# Patient Record
Sex: Female | Born: 1950 | Race: White | Hispanic: No | Marital: Married
Health system: Southern US, Community
[De-identification: ages and names within clinical notes are randomized; demographics above are authoritative.]

---

## 2000-08-13 ENCOUNTER — Encounter: Payer: Self-pay | Admitting: Internal Medicine

## 2000-08-13 ENCOUNTER — Emergency Department (HOSPITAL_COMMUNITY): Admission: EM | Admit: 2000-08-13 | Discharge: 2000-08-13 | Payer: Self-pay | Admitting: Emergency Medicine

## 2001-03-18 ENCOUNTER — Encounter: Admission: RE | Admit: 2001-03-18 | Discharge: 2001-03-18 | Payer: Self-pay

## 2018-10-02 ENCOUNTER — Other Ambulatory Visit: Payer: Self-pay

## 2018-10-02 ENCOUNTER — Encounter (HOSPITAL_COMMUNITY): Payer: Self-pay | Admitting: *Deleted

## 2018-10-02 ENCOUNTER — Emergency Department (HOSPITAL_COMMUNITY): Payer: Medicare HMO

## 2018-10-02 ENCOUNTER — Inpatient Hospital Stay (HOSPITAL_COMMUNITY)
Admission: EM | Admit: 2018-10-02 | Discharge: 2018-10-16 | DRG: 208 | Disposition: A | Discharge status: Hospice | Payer: Medicare HMO | Attending: Internal Medicine | Admitting: Internal Medicine

## 2018-10-02 DIAGNOSIS — Z66 Do not resuscitate: Secondary | ICD-10-CM | POA: Diagnosis present

## 2018-10-02 DIAGNOSIS — J9621 Acute and chronic respiratory failure with hypoxia: Secondary | ICD-10-CM

## 2018-10-02 DIAGNOSIS — M6282 Rhabdomyolysis: Secondary | ICD-10-CM | POA: Diagnosis present

## 2018-10-02 DIAGNOSIS — L270 Generalized skin eruption due to drugs and medicaments taken internally: Secondary | ICD-10-CM | POA: Diagnosis not present

## 2018-10-02 DIAGNOSIS — G9340 Encephalopathy, unspecified: Secondary | ICD-10-CM

## 2018-10-02 DIAGNOSIS — Z0189 Encounter for other specified special examinations: Secondary | ICD-10-CM

## 2018-10-02 DIAGNOSIS — F0280 Dementia in other diseases classified elsewhere without behavioral disturbance: Secondary | ICD-10-CM

## 2018-10-02 DIAGNOSIS — E1165 Type 2 diabetes mellitus with hyperglycemia: Secondary | ICD-10-CM | POA: Diagnosis present

## 2018-10-02 DIAGNOSIS — Z79890 Hormone replacement therapy (postmenopausal): Secondary | ICD-10-CM

## 2018-10-02 DIAGNOSIS — G9341 Metabolic encephalopathy: Secondary | ICD-10-CM | POA: Diagnosis present

## 2018-10-02 DIAGNOSIS — Z87891 Personal history of nicotine dependence: Secondary | ICD-10-CM | POA: Diagnosis not present

## 2018-10-02 DIAGNOSIS — I10 Essential (primary) hypertension: Secondary | ICD-10-CM | POA: Diagnosis present

## 2018-10-02 DIAGNOSIS — R4182 Altered mental status, unspecified: Secondary | ICD-10-CM | POA: Diagnosis not present

## 2018-10-02 DIAGNOSIS — R509 Fever, unspecified: Secondary | ICD-10-CM | POA: Diagnosis present

## 2018-10-02 DIAGNOSIS — E860 Dehydration: Secondary | ICD-10-CM

## 2018-10-02 DIAGNOSIS — Z79899 Other long term (current) drug therapy: Secondary | ICD-10-CM

## 2018-10-02 DIAGNOSIS — E861 Hypovolemia: Secondary | ICD-10-CM | POA: Diagnosis present

## 2018-10-02 DIAGNOSIS — R29810 Facial weakness: Secondary | ICD-10-CM | POA: Diagnosis present

## 2018-10-02 DIAGNOSIS — I480 Paroxysmal atrial fibrillation: Secondary | ICD-10-CM | POA: Diagnosis present

## 2018-10-02 DIAGNOSIS — E872 Acidosis: Secondary | ICD-10-CM | POA: Diagnosis present

## 2018-10-02 DIAGNOSIS — G300 Alzheimer's disease with early onset: Secondary | ICD-10-CM | POA: Diagnosis present

## 2018-10-02 DIAGNOSIS — Z7189 Other specified counseling: Secondary | ICD-10-CM

## 2018-10-02 DIAGNOSIS — E876 Hypokalemia: Secondary | ICD-10-CM | POA: Diagnosis present

## 2018-10-02 DIAGNOSIS — E0590 Thyrotoxicosis, unspecified without thyrotoxic crisis or storm: Secondary | ICD-10-CM | POA: Diagnosis present

## 2018-10-02 DIAGNOSIS — I471 Supraventricular tachycardia: Secondary | ICD-10-CM | POA: Diagnosis present

## 2018-10-02 DIAGNOSIS — E039 Hypothyroidism, unspecified: Secondary | ICD-10-CM | POA: Diagnosis present

## 2018-10-02 DIAGNOSIS — T360X5A Adverse effect of penicillins, initial encounter: Secondary | ICD-10-CM | POA: Diagnosis not present

## 2018-10-02 DIAGNOSIS — J189 Pneumonia, unspecified organism: Secondary | ICD-10-CM

## 2018-10-02 DIAGNOSIS — J9600 Acute respiratory failure, unspecified whether with hypoxia or hypercapnia: Secondary | ICD-10-CM | POA: Diagnosis not present

## 2018-10-02 DIAGNOSIS — R339 Retention of urine, unspecified: Secondary | ICD-10-CM | POA: Diagnosis not present

## 2018-10-02 DIAGNOSIS — J690 Pneumonitis due to inhalation of food and vomit: Principal | ICD-10-CM | POA: Diagnosis present

## 2018-10-02 DIAGNOSIS — L8990 Pressure ulcer of unspecified site, unspecified stage: Secondary | ICD-10-CM

## 2018-10-02 DIAGNOSIS — N179 Acute kidney failure, unspecified: Secondary | ICD-10-CM | POA: Diagnosis present

## 2018-10-02 DIAGNOSIS — I4891 Unspecified atrial fibrillation: Secondary | ICD-10-CM

## 2018-10-02 DIAGNOSIS — E870 Hyperosmolality and hypernatremia: Secondary | ICD-10-CM | POA: Diagnosis present

## 2018-10-02 DIAGNOSIS — T1490XA Injury, unspecified, initial encounter: Secondary | ICD-10-CM

## 2018-10-02 DIAGNOSIS — Z7901 Long term (current) use of anticoagulants: Secondary | ICD-10-CM

## 2018-10-02 DIAGNOSIS — J9601 Acute respiratory failure with hypoxia: Secondary | ICD-10-CM | POA: Diagnosis not present

## 2018-10-02 DIAGNOSIS — Z4659 Encounter for fitting and adjustment of other gastrointestinal appliance and device: Secondary | ICD-10-CM

## 2018-10-02 DIAGNOSIS — Z515 Encounter for palliative care: Secondary | ICD-10-CM | POA: Diagnosis not present

## 2018-10-02 LAB — BASIC METABOLIC PANEL
Anion gap: 14 (ref 5–15)
BUN: 70 mg/dL — ABNORMAL HIGH (ref 8–23)
CO2: 16 mmol/L — ABNORMAL LOW (ref 22–32)
Calcium: 8.6 mg/dL — ABNORMAL LOW (ref 8.9–10.3)
Chloride: 123 mmol/L — ABNORMAL HIGH (ref 98–111)
Creatinine, Ser: 2.65 mg/dL — ABNORMAL HIGH (ref 0.44–1.00)
GFR calc Af Amer: 21 mL/min — ABNORMAL LOW (ref >60)
GFR calc non Af Amer: 18 mL/min — ABNORMAL LOW (ref >60)
Glucose, Bld: 167 mg/dL — ABNORMAL HIGH (ref 70–99)
Potassium: 3.1 mmol/L — ABNORMAL LOW (ref 3.5–5.1)
Sodium: 153 mmol/L — ABNORMAL HIGH (ref 135–145)

## 2018-10-02 LAB — COMPREHENSIVE METABOLIC PANEL
ALT: 43 U/L (ref 0–44)
AST: 74 U/L — ABNORMAL HIGH (ref 15–41)
Albumin: 3.4 g/dL — ABNORMAL LOW (ref 3.5–5.0)
Alkaline Phosphatase: 95 U/L (ref 38–126)
Anion gap: 22 — ABNORMAL HIGH (ref 5–15)
BUN: 70 mg/dL — ABNORMAL HIGH (ref 8–23)
CO2: 13 mmol/L — ABNORMAL LOW (ref 22–32)
Calcium: 9.1 mg/dL (ref 8.9–10.3)
Chloride: 118 mmol/L — ABNORMAL HIGH (ref 98–111)
Creatinine, Ser: 3.43 mg/dL — ABNORMAL HIGH (ref 0.44–1.00)
GFR calc Af Amer: 15 mL/min — ABNORMAL LOW (ref >60)
GFR calc non Af Amer: 13 mL/min — ABNORMAL LOW (ref >60)
Glucose, Bld: 168 mg/dL — ABNORMAL HIGH (ref 70–99)
Potassium: 3.7 mmol/L (ref 3.5–5.1)
Sodium: 153 mmol/L — ABNORMAL HIGH (ref 135–145)
Total Bilirubin: 2.4 mg/dL — ABNORMAL HIGH (ref 0.3–1.2)
Total Protein: 6.4 g/dL — ABNORMAL LOW (ref 6.5–8.1)

## 2018-10-02 LAB — POCT I-STAT 7, (LYTES, BLD GAS, ICA,H+H)
Acid-base deficit: 11 mmol/L — ABNORMAL HIGH (ref 0.0–2.0)
Acid-base deficit: 10 mmol/L — ABNORMAL HIGH (ref 0.0–2.0)
Bicarbonate: 16.5 mmol/L — ABNORMAL LOW (ref 20.0–28.0)
Bicarbonate: 15.2 mmol/L — ABNORMAL LOW (ref 20.0–28.0)
Calcium, Ion: 1.22 mmol/L (ref 1.15–1.40)
Calcium, Ion: 1.29 mmol/L (ref 1.15–1.40)
HCT: 35 % — ABNORMAL LOW (ref 36.0–46.0)
HCT: 35 % — ABNORMAL LOW (ref 36.0–46.0)
Hemoglobin: 11.9 g/dL — ABNORMAL LOW (ref 12.0–15.0)
Hemoglobin: 11.9 g/dL — ABNORMAL LOW (ref 12.0–15.0)
O2 Saturation: 100 %
O2 Saturation: 98 %
Patient temperature: 34.8
Potassium: 2.8 mmol/L — ABNORMAL LOW (ref 3.5–5.1)
Potassium: 2.9 mmol/L — ABNORMAL LOW (ref 3.5–5.1)
Sodium: 153 mmol/L — ABNORMAL HIGH (ref 135–145)
Sodium: 153 mmol/L — ABNORMAL HIGH (ref 135–145)
TCO2: 18 mmol/L — ABNORMAL LOW (ref 22–32)
TCO2: 16 mmol/L — ABNORMAL LOW (ref 22–32)
pCO2 arterial: 40.1 mmHg (ref 32.0–48.0)
pCO2 arterial: 27.5 mmHg — ABNORMAL LOW (ref 32.0–48.0)
pH, Arterial: 7.223 — ABNORMAL LOW (ref 7.350–7.450)
pH, Arterial: 7.338 — ABNORMAL LOW (ref 7.350–7.450)
pO2, Arterial: 592 mmHg — ABNORMAL HIGH (ref 83.0–108.0)
pO2, Arterial: 104 mmHg (ref 83.0–108.0)

## 2018-10-02 LAB — URINALYSIS, ROUTINE W REFLEX MICROSCOPIC
Bilirubin Urine: NEGATIVE
Glucose, UA: NEGATIVE mg/dL
Ketones, ur: 20 mg/dL — AB
Leukocytes,Ua: NEGATIVE
Nitrite: NEGATIVE
Protein, ur: 100 mg/dL — AB
RBC / HPF: >50 RBC/hpf — ABNORMAL HIGH (ref 0–5)
Specific Gravity, Urine: 1.02 (ref 1.005–1.030)
pH: 6 (ref 5.0–8.0)

## 2018-10-02 LAB — POCT I-STAT EG7
Acid-base deficit: 14 mmol/L — ABNORMAL HIGH (ref 0.0–2.0)
Bicarbonate: 13.1 mmol/L — ABNORMAL LOW (ref 20.0–28.0)
Calcium, Ion: 1.13 mmol/L — ABNORMAL LOW (ref 1.15–1.40)
HCT: 40 % (ref 36.0–46.0)
Hemoglobin: 13.6 g/dL (ref 12.0–15.0)
O2 Saturation: 97 %
Potassium: 3.6 mmol/L (ref 3.5–5.1)
Sodium: 153 mmol/L — ABNORMAL HIGH (ref 135–145)
TCO2: 14 mmol/L — ABNORMAL LOW (ref 22–32)
pCO2, Ven: 33 mmHg — ABNORMAL LOW (ref 44.0–60.0)
pH, Ven: 7.207 — ABNORMAL LOW (ref 7.250–7.430)
pO2, Ven: 103 mmHg — ABNORMAL HIGH (ref 32.0–45.0)

## 2018-10-02 LAB — CBC WITH DIFFERENTIAL/PLATELET
Abs Immature Granulocytes: 0.07 10*3/uL (ref 0.00–0.07)
Basophils Absolute: 0 10*3/uL (ref 0.0–0.1)
Basophils Relative: 0 %
Eosinophils Absolute: 0 10*3/uL (ref 0.0–0.5)
Eosinophils Relative: 0 %
HCT: 47.5 % — ABNORMAL HIGH (ref 36.0–46.0)
Hemoglobin: 14.9 g/dL (ref 12.0–15.0)
Immature Granulocytes: 1 %
Lymphocytes Relative: 7 %
Lymphs Abs: 0.7 10*3/uL (ref 0.7–4.0)
MCH: 27.9 pg (ref 26.0–34.0)
MCHC: 31.4 g/dL (ref 30.0–36.0)
MCV: 89 fL (ref 80.0–100.0)
Monocytes Absolute: 0.8 10*3/uL (ref 0.1–1.0)
Monocytes Relative: 7 %
Neutro Abs: 9.7 10*3/uL — ABNORMAL HIGH (ref 1.7–7.7)
Neutrophils Relative %: 85 %
Platelets: 286 10*3/uL (ref 150–400)
RBC: 5.34 MIL/uL — ABNORMAL HIGH (ref 3.87–5.11)
RDW: 14.6 % (ref 11.5–15.5)
WBC: 11.3 10*3/uL — ABNORMAL HIGH (ref 4.0–10.5)
nRBC: 0 % (ref 0.0–0.2)

## 2018-10-02 LAB — GLUCOSE, CAPILLARY
Glucose-Capillary: 114 mg/dL — ABNORMAL HIGH (ref 70–99)
Glucose-Capillary: 120 mg/dL — ABNORMAL HIGH (ref 70–99)
Glucose-Capillary: 184 mg/dL — ABNORMAL HIGH (ref 70–99)

## 2018-10-02 LAB — TROPONIN I: Troponin I: 0.08 ng/mL (ref <0.03)

## 2018-10-02 LAB — CK TOTAL AND CKMB (NOT AT ARMC)
CK, MB: 42.8 ng/mL — ABNORMAL HIGH (ref 0.5–5.0)
Relative Index: 1.6 (ref 0.0–2.5)
Total CK: 2642 U/L — ABNORMAL HIGH (ref 38–234)

## 2018-10-02 LAB — PROTIME-INR
INR: 1.2 (ref 0.8–1.2)
Prothrombin Time: 15.4 seconds — ABNORMAL HIGH (ref 11.4–15.2)

## 2018-10-02 LAB — LACTIC ACID, PLASMA: Lactic Acid, Venous: 4.5 mmol/L (ref 0.5–1.9)

## 2018-10-02 MED ORDER — POTASSIUM CHLORIDE 10 MEQ/100ML IV SOLN
10.0000 meq | INTRAVENOUS | Status: AC
  Administered 2018-10-02 (×2): 10 meq via INTRAVENOUS
  Filled 2018-10-02 (×2): qty 100

## 2018-10-02 MED ORDER — SODIUM CHLORIDE 0.9 % IV SOLN
2.0000 g | Freq: Once | INTRAVENOUS | Status: AC
  Administered 2018-10-02: 2 g via INTRAVENOUS
  Filled 2018-10-02: qty 20

## 2018-10-02 MED ORDER — SODIUM CHLORIDE 0.9 % IV SOLN
INTRAVENOUS | Status: DC
  Administered 2018-10-02: 100 mL/h via INTRAVENOUS
  Administered 2018-10-03: 02:00:00 via INTRAVENOUS

## 2018-10-02 MED ORDER — AMIODARONE LOAD VIA INFUSION
150.0000 mg | Freq: Once | INTRAVENOUS | Status: AC
  Administered 2018-10-02: 150 mg via INTRAVENOUS
  Filled 2018-10-02: qty 83.34

## 2018-10-02 MED ORDER — SODIUM CHLORIDE 0.9 % IV BOLUS
1000.0000 mL | Freq: Once | INTRAVENOUS | Status: AC
  Administered 2018-10-02: 1000 mL via INTRAVENOUS

## 2018-10-02 MED ORDER — CHLORHEXIDINE GLUCONATE CLOTH 2 % EX PADS
6.0000 | MEDICATED_PAD | Freq: Every day | CUTANEOUS | Status: DC
  Administered 2018-10-03 – 2018-10-08 (×6): 6 via TOPICAL

## 2018-10-02 MED ORDER — SODIUM CHLORIDE 0.9 % IV BOLUS
500.0000 mL | Freq: Once | INTRAVENOUS | Status: AC
  Administered 2018-10-02: 18:00:00 500 mL via INTRAVENOUS

## 2018-10-02 MED ORDER — SODIUM CHLORIDE 0.9 % IV SOLN
Freq: Once | INTRAVENOUS | Status: AC
  Administered 2018-10-02: 13:00:00 via INTRAVENOUS

## 2018-10-02 MED ORDER — HEPARIN SODIUM (PORCINE) 5000 UNIT/ML IJ SOLN: 5000.0000 [IU] | Freq: Three times a day (TID) | INTRAMUSCULAR | Status: DC

## 2018-10-02 MED ORDER — INSULIN ASPART 100 UNIT/ML ~~LOC~~ SOLN
0.0000 [IU] | SUBCUTANEOUS | Status: DC
  Administered 2018-10-02: 3 [IU] via SUBCUTANEOUS
  Administered 2018-10-03: 2 [IU] via SUBCUTANEOUS
  Administered 2018-10-03: 3 [IU] via SUBCUTANEOUS
  Administered 2018-10-03 (×2): 2 [IU] via SUBCUTANEOUS
  Administered 2018-10-03: 3 [IU] via SUBCUTANEOUS
  Administered 2018-10-04 – 2018-10-05 (×3): 2 [IU] via SUBCUTANEOUS
  Administered 2018-10-05 – 2018-10-07 (×6): 3 [IU] via SUBCUTANEOUS
  Administered 2018-10-07: 5 [IU] via SUBCUTANEOUS
  Administered 2018-10-07 (×2): 3 [IU] via SUBCUTANEOUS
  Administered 2018-10-07: 17:00:00 8 [IU] via SUBCUTANEOUS
  Administered 2018-10-07: 3 [IU] via SUBCUTANEOUS
  Administered 2018-10-08: 8 [IU] via SUBCUTANEOUS
  Administered 2018-10-08: 11 [IU] via SUBCUTANEOUS
  Administered 2018-10-08: 18:00:00 15 [IU] via SUBCUTANEOUS
  Administered 2018-10-08 (×2): 3 [IU] via SUBCUTANEOUS
  Administered 2018-10-08 – 2018-10-09 (×2): 5 [IU] via SUBCUTANEOUS
  Administered 2018-10-09: 04:00:00 3 [IU] via SUBCUTANEOUS
  Administered 2018-10-09 (×2): 8 [IU] via SUBCUTANEOUS
  Administered 2018-10-09: 11 [IU] via SUBCUTANEOUS
  Administered 2018-10-10: 3 [IU] via SUBCUTANEOUS
  Administered 2018-10-10: 12:00:00 5 [IU] via SUBCUTANEOUS
  Administered 2018-10-10: 2 [IU] via SUBCUTANEOUS
  Administered 2018-10-10: 3 [IU] via SUBCUTANEOUS

## 2018-10-02 MED ORDER — ROCURONIUM BROMIDE 50 MG/5ML IV SOLN
INTRAVENOUS | Status: AC | PRN
  Administered 2018-10-02: 80 mg via INTRAVENOUS

## 2018-10-02 MED ORDER — AMIODARONE HCL IN DEXTROSE 360-4.14 MG/200ML-% IV SOLN
60.0000 mg/h | INTRAVENOUS | Status: AC
  Administered 2018-10-02 – 2018-10-03 (×2): 60 mg/h via INTRAVENOUS
  Filled 2018-10-02 (×2): qty 200

## 2018-10-02 MED ORDER — METOPROLOL TARTRATE 5 MG/5ML IV SOLN
2.5000 mg | INTRAVENOUS | Status: DC | PRN
  Administered 2018-10-02: 16:00:00 5 mg via INTRAVENOUS
  Administered 2018-10-03 – 2018-10-04 (×2): 2.5 mg via INTRAVENOUS
  Administered 2018-10-04 – 2018-10-05 (×5): 5 mg via INTRAVENOUS
  Filled 2018-10-02 (×7): qty 5

## 2018-10-02 MED ORDER — CHLORHEXIDINE GLUCONATE 0.12% ORAL RINSE (MEDLINE KIT)
15.0000 mL | Freq: Two times a day (BID) | OROMUCOSAL | Status: DC
  Administered 2018-10-02 – 2018-10-15 (×24): 15 mL via OROMUCOSAL

## 2018-10-02 MED ORDER — PANTOPRAZOLE SODIUM 40 MG IV SOLR
40.0000 mg | Freq: Every day | INTRAVENOUS | Status: DC
  Administered 2018-10-02 – 2018-10-03 (×2): 40 mg via INTRAVENOUS
  Filled 2018-10-02 (×2): qty 40

## 2018-10-02 MED ORDER — ETOMIDATE 2 MG/ML IV SOLN
INTRAVENOUS | Status: AC | PRN
  Administered 2018-10-02: 20 mg via INTRAVENOUS

## 2018-10-02 MED ORDER — PROPOFOL 1000 MG/100ML IV EMUL
0.0000 ug/kg/min | INTRAVENOUS | Status: DC
  Administered 2018-10-02 – 2018-10-03 (×2): 15 ug/kg/min via INTRAVENOUS
  Administered 2018-10-04: 10 ug/kg/min via INTRAVENOUS
  Administered 2018-10-04: 5 ug/kg/min via INTRAVENOUS
  Filled 2018-10-02 (×3): qty 100

## 2018-10-02 MED ORDER — HEPARIN (PORCINE) 25000 UT/250ML-% IV SOLN
1250.0000 [IU]/h | INTRAVENOUS | Status: DC
  Administered 2018-10-02 – 2018-10-03 (×2): 1000 [IU]/h via INTRAVENOUS
  Administered 2018-10-04: 13:00:00 1200 [IU]/h via INTRAVENOUS
  Administered 2018-10-05: 1400 [IU]/h via INTRAVENOUS
  Administered 2018-10-06: 01:00:00 1250 [IU]/h via INTRAVENOUS
  Filled 2018-10-02 (×5): qty 250

## 2018-10-02 MED ORDER — FENTANYL CITRATE (PF) 100 MCG/2ML IJ SOLN: 25.0000 ug | INTRAMUSCULAR | Status: DC | PRN

## 2018-10-02 MED ORDER — PROPOFOL 1000 MG/100ML IV EMUL
5.0000 ug/kg/min | INTRAVENOUS | Status: DC
  Administered 2018-10-02: 10 ug/kg/min via INTRAVENOUS

## 2018-10-02 MED ORDER — FENTANYL CITRATE (PF) 100 MCG/2ML IJ SOLN
25.0000 ug | INTRAMUSCULAR | Status: DC | PRN
  Administered 2018-10-03: 25 ug via INTRAVENOUS
  Filled 2018-10-02: qty 2

## 2018-10-02 MED ORDER — AMIODARONE HCL IN DEXTROSE 360-4.14 MG/200ML-% IV SOLN
30.0000 mg/h | INTRAVENOUS | Status: DC
  Administered 2018-10-03: 10:00:00 30 mg/h via INTRAVENOUS
  Filled 2018-10-02: qty 200

## 2018-10-02 MED ORDER — HEPARIN BOLUS VIA INFUSION
3600.0000 [IU] | Freq: Once | INTRAVENOUS | Status: AC
  Administered 2018-10-02: 3600 [IU] via INTRAVENOUS
  Filled 2018-10-02: qty 3600

## 2018-10-02 MED ORDER — SODIUM CHLORIDE 0.9% FLUSH: 3.0000 mL | Freq: Once | INTRAVENOUS | Status: DC

## 2018-10-02 MED ORDER — PROPOFOL 1000 MG/100ML IV EMUL
INTRAVENOUS | Status: AC
  Filled 2018-10-02: qty 100

## 2018-10-02 MED ORDER — ORAL CARE MOUTH RINSE
15.0000 mL | OROMUCOSAL | Status: DC
  Administered 2018-10-02 – 2018-10-05 (×22): 15 mL via OROMUCOSAL

## 2018-10-02 NOTE — Progress Notes (Signed)
Pt continues to have elevated HR in the 140-160s, Afib RVR; pt has PRN lopressor ordered but unable to give as pt is hypotensive (systolic in the 80s). eLink notified. Awaiting new orders.  Herma Ard, RN

## 2018-10-02 NOTE — Progress Notes (Signed)
eLink Physician-Brief Progress Note Patient Name: Jordan Reed DOB: 24-Nov-1950 MRN: [id number]   Date of Service  10/02/2018  HPI/Events of Note  AFIB with RVR - Ventricular RAte = 1.7-150's. K+ = 2.9. K+ is being replaced. BP = 92/49.  eICU Interventions  Will order: 1. Amiodarone IV load and infusion.  2. Repeat BMP and Mg++ level at 10 PM.     Intervention Category Major Interventions: Arrhythmia - evaluation and management  Sommer,Steven Eugene 10/02/2018, 8:40 PM

## 2018-10-02 NOTE — ED Provider Notes (Signed)
[facility] Emergency Department Provider Note MRN:  [id number]  Arrival date & time: 10/02/18     Chief Complaint   Tachycardia   History of Present Illness   Jordan Reed is a 68 y.o. year-old female with unknown past medical history presenting to the ED with chief complaint of tachycardia.  Per report patient has a caregiver that was hospitalized 4 days ago and so the patient has been alone since that time.  Was found on the ground outside of her home by the mailman.  Altered, with bruising, combative.  Heart rate in the 190s with EMS, attempted cardioversion twice.  Review of Systems  A complete 10 system review of systems was obtained and all systems are negative except as noted in the HPI and PMH.   Patient's Health History   No past medical history on file.    No family history on file.  Social History   Socioeconomic History  . Marital status: Married    Spouse name: Not on file  . Number of children: Not on file  . Years of education: Not on file  . Highest education level: Not on file  Occupational History  . Not on file  Social Needs  . Financial resource strain: Not on file  . Food insecurity:    Worry: Not on file    Inability: Not on file  . Transportation needs:    Medical: Not on file    Non-medical: Not on file  Tobacco Use  . Smoking status: Not on file  Substance and Sexual Activity  . Alcohol use: Not on file  . Drug use: Not on file  . Sexual activity: Not on file  Lifestyle  . Physical activity:    Days per week: Not on file    Minutes per session: Not on file  . Stress: Not on file  Relationships  . Social connections:    Talks on phone: Not on file    Gets together: Not on file    Attends religious service: Not on file    Active member of club or organization: Not on file    Attends meetings of clubs or organizations: Not on file    Relationship status: Not on file  . Intimate partner violence:    Fear of current  or ex partner: Not on file    Emotionally abused: Not on file    Physically abused: Not on file    Forced sexual activity: Not on file  Other Topics Concern  . Not on file  Social History Narrative  . Not on file     Physical Exam  Vital Signs and Nursing Notes reviewed Vitals:   10/02/18 1111 10/02/18 1115  BP:  (!) 154/107  Pulse: (!) 153 80  Resp: 18 18  Temp:    SpO2: 100% 100%    CONSTITUTIONAL: Ill-appearing, combative NEURO: Awake, alert, not following commands, combative, moving all extremities EYES:  eyes equal and reactive ENT/NECK:  no LAD, no JVD CARDIO: Tachycardic rate, well-perfused, normal S1 and S2 PULM:  CTAB no wheezing or rhonchi GI/GU:  normal bowel sounds, non-distended, non-tender MSK/SPINE:  No gross deformities, pressure wounds to the right lateral thigh, bruising to the face and right flank SKIN:  no rash, atraumatic PSYCH: Unable to assess due to severity of condition  Diagnostic and Interventional Summary    EKG Interpretation  Date/Time:  Wednesday October 02 2018 10:41:35 EDT Ventricular Rate:  174 PR Interval:  QRS Duration: 70 QT Interval:  295 QTC Calculation: 502 R Axis:   20 Text Interpretation:  Atrial fibrillation with rapid V-rate Abnormal R-wave progression, early transition ST depression, probably rate related Baseline wander in lead(s) V1 Confirmed by Kennis CarinaBero, Marquavion Venhuizen [phone]([id number]) on 10/02/2018 11:47:40 AM      Labs Reviewed  COMPREHENSIVE METABOLIC PANEL - Abnormal; Notable for the following components:      Result Value   Sodium 153 (*)    Chloride 118 (*)    CO2 13 (*)    Glucose, Bld 168 (*)    BUN 70 (*)    Creatinine, Ser 3.43 (*)    Total Protein 6.4 (*)    Albumin 3.4 (*)    AST 74 (*)    Total Bilirubin 2.4 (*)    GFR calc non Af Amer 13 (*)    GFR calc Af Amer 15 (*)    Anion gap 22 (*)    All other components within normal limits  LACTIC ACID, PLASMA - Abnormal; Notable for the following components:   Lactic  Acid, Venous 4.5 (*)    All other components within normal limits  CBC WITH DIFFERENTIAL/PLATELET - Abnormal; Notable for the following components:   WBC 11.3 (*)    RBC 5.34 (*)    HCT 47.5 (*)    Neutro Abs 9.7 (*)    All other components within normal limits  PROTIME-INR - Abnormal; Notable for the following components:   Prothrombin Time 15.4 (*)    All other components within normal limits  CK TOTAL AND CKMB (NOT [facility]) - Abnormal; Notable for the following components:   Total CK 2,642 (*)    CK, MB 42.8 (*)    All other components within normal limits  TROPONIN I - Abnormal; Notable for the following components:   Troponin I 0.08 (*)    All other components within normal limits  POCT I-STAT EG7 - Abnormal; Notable for the following components:   pH, Ven 7.207 (*)    pCO2, Ven 33.0 (*)    pO2, Ven 103.0 (*)    Bicarbonate 13.1 (*)    TCO2 14 (*)    Acid-base deficit 14.0 (*)    Sodium 153 (*)    Calcium, Ion 1.13 (*)    All other components within normal limits  CULTURE, BLOOD (ROUTINE X 2)  CULTURE, BLOOD (ROUTINE X 2)  URINALYSIS, ROUTINE W REFLEX MICROSCOPIC  BLOOD GAS, ARTERIAL  I-STAT VENOUS BLOOD GAS, ED    CT HEAD WO CONTRAST  Final Result    CT Chest Wo Contrast  Final Result    CT ABDOMEN PELVIS WO CONTRAST  Final Result    DG Chest Portable 1 View  Final Result    DG Pelvis Portable  Final Result    DG FEMUR PORT, MIN 2 VIEWS RIGHT  Final Result      Medications  sodium chloride flush (NS) 0.9 % injection 3 mL (has no administration in time range)  propofol (DIPRIVAN) 1000 MG/100ML infusion (10 mcg/kg/min  77.1 kg Intravenous New Bag/Given 10/02/18 1126)  propofol (DIPRIVAN) 1000 MG/100ML infusion (has no administration in time range)  0.9 %  sodium chloride infusion (has no administration in time range)  etomidate (AMIDATE) injection (20 mg Intravenous Given 10/02/18 1040)  rocuronium (ZEMURON) injection (80 mg Intravenous Given 10/02/18  1042)  sodium chloride 0.9 % bolus 1,000 mL (1,000 mLs Intravenous New Bag/Given 10/02/18 1055)  sodium chloride 0.9 % bolus 1,000 mL (0  mLs Intravenous Stopped 10/02/18 1200)  cefTRIAXone (ROCEPHIN) 2 g in sodium chloride 0.9 % 100 mL IVPB (0 g Intravenous Stopped 10/02/18 1144)     Procedure Name: Intubation Date/Time: 10/02/2018 12:42 PM Performed by: Sabas Sous, MD Pre-anesthesia Checklist: Patient identified, Emergency Drugs available, Suction available and Patient being monitored Oxygen Delivery Method: Non-rebreather mask Preoxygenation: Pre-oxygenation with 100% oxygen Induction Type: IV induction and Rapid sequence Laryngoscope Size: Glidescope and 3 Grade View: Grade II Tube size: 7.5 mm Number of attempts: 1 Airway Equipment and Method: Rigid stylet and Video-laryngoscopy Placement Confirmation: ETT inserted through vocal cords under direct vision and Positive ETCO2 Secured at: 22 cm Tube secured with: ETT holder Difficulty Due To: Difficult Airway- due to anterior larynx Comments: Airway was anterior requiring some effort with glide scope.  RSI with 20 mg of etomidate and 80 mg succinylcholine      Critical Care Critical Care Documentation Critical care time provided by me (excluding procedures): 38 minutes  Condition necessitating critical care: Altered mental status, combativeness, severe dehydration with AKI, rhabdomyolysis, atrial fibrillation with rapid ventricular response  Components of critical care management: reviewing of prior records, laboratory and imaging interpretation, frequent re-examination and reassessment of vital signs, administration of IV fluids, ventilatory management, discussion with consulting services    ED Course and Medical Decision Making  I have reviewed the triage vital signs and the nursing notes.  Pertinent labs & imaging results that were available during my care of the patient were reviewed by me and considered in my medical  decision making (see below for details).    Clinical Course as of Oct 01 1249  Wed Oct 02, 2018  1118 Patient was found outside on the ground, unknown downtime, reportedly her caregiver is hospitalized and has been for the past 4 days.  Patient is confused, combative, obvious pressure sores to the right leg, tachycardic A. fib with RVR with rates between 160 and 190.  Blood pressure 130s systolic.  Clearly hypovolemic, concern for metabolic disarray, sepsis, trauma.  Intubated as described above for airway protection and to facilitate critical care management.  Patient is hypothermic, currently sedated with propofol, heart rate responding well to fluids so far, will closely monitor.   [MB]    Clinical Course User Index [MB] Sabas Sous, MD    Admitted to intensivist service for further care.  Elmer Sow. Pilar Plate, MD [facility] Emergency Medicine [facility] [email or web address]  Final Clinical Impressions(s) / ED Diagnoses     ICD-10-CM   1. Atrial fibrillation with RVR (HCC) I48.91   2. Trauma T14.90XA DG FEMUR PORT, MIN 2 VIEWS RIGHT    DG FEMUR PORT, MIN 2 VIEWS RIGHT  3. Dehydration E86.0   4. Non-traumatic rhabdomyolysis M62.82   5. AKI (acute kidney injury) (HCC) N17.9   6. Altered mental status, unspecified altered mental status type R41.82     ED Discharge Orders    None         Sabas Sous, MD 10/02/18 1252

## 2018-10-02 NOTE — Progress Notes (Signed)
PCCM INTERVAL PROGRESS NOTE  Was able to reach the patient's daughter via phone.   Her name is Trula Ore and can be reached at [phone]  She tells me the patient's boyfriend is POA by report, but no documents have been seen. The patient is not married. The boyfriend is her primary caregiver, but currently in a psych admission, therefore, he can not be relied upon. The story of psych admission lines up with story I heard from ED (caretaker in hospital). The daughter is comfortable accepting responsibility of decision maker.   The patient has a history of dementia, alzheimer's, HTN, DM, Hyperthyroid, and anemia.   Outpatient meds have been tracked down by pharmacy and have been reported in EMR.   Daughter has been made aware of current condition.   Plan: Add CBG monitoring and SSI DNR per discussion with daughter. Aggressive measures up until point of arrest including HD if indicated.    Joneen Roach, AGACNP-BC [facility] Pulmonary/Critical Care Pager [phone] or [phone]  10/02/2018 3:08 PM

## 2018-10-02 NOTE — ED Triage Notes (Addendum)
Pt here via EMS after being found in her yard by the mailman.  Unknown down time.  Her caretaker has been in hospital x 4 days. Pt's initial hr was 210, bp 86/40, sats 98% ra. cbg 134.  PT schocked twice en-route. Pressure wounds to R leg as if pt has been laying in same position for quite some time.  Pt moving both arms, able to look towards voice.

## 2018-10-02 NOTE — Progress Notes (Signed)
Pt having atrial fib with RVR in the 130's- 140's and hypotension. CCM made aware. New orders obtained. Will continue to monitor.

## 2018-10-02 NOTE — Social Work (Signed)
Acknowledging consult for assistance with locating pt family.  Per notes Joneen Roach, NP was able to locate pt daughter.  CSW signing off. Please consult if any additional needs arise.  Doy Hutching, LCSWA [facility] Clinical Social Work [phone]

## 2018-10-02 NOTE — H&P (Signed)
NAME:  Jordan Reed, MRN:  [id number], DOB:  03-08-1951, LOS: 0 ADMISSION DATE:  10/02/2018, CONSULTATION DATE: 10/02/2018 REFERRING MD: Dr. Pilar Plate, CHIEF COMPLAINT: Altered mental status  Brief History   68 year old female found down with altered mental status admitted 4/15 with acute renal failure from dehydration/rhabdomyolysis.  Intubated in the ED for airway protection.  History of present illness   68 year old female with no known past medical history.  She has no medical records with [place] and I have queried [place], [place], and [facility] with no files found.  She apparently lives at home but has very active involvement of a caregiver who was unfortunately hospitalized 4 days ago.  Since that time the patient has been by herself.  It is unclear what level of assistance she requires at baseline.  A car driving past her house noticed her to be slumped over on the front porch on 10/02/2018.  EMS was alerted and upon their arrival she had altered mental status.  They hooked her up to EKG and found her to be profoundly tachycardic with a narrow complex rhythm.  They felt as though her symptoms may be secondary to symptomatic SVT.  She was cardioverted twice without effect.  In the emergency department she remained tachycardic.  She was somewhat cooperative was staff and able to give a limited history, however, she became increasingly agitated and required intubation for airway protection.  Laboratory evaluation consistent with dehydration rhabdomyolysis and therefore IV fluid resuscitation was given.  She received 3 L of crystalloid and then was started on infusion at 125 an hour.  PCCM was asked to admit.  Past Medical History   has no past medical history on file.  Significant Hospital Events   4/15 admit  Consults:    Procedures:  ETT 4/15 >  Significant Diagnostic Tests:  CT abdomen 4/15 > No acute abnormality. Dilatation of the common bile duct 15 mm, above normal limits for a post  cholecystectomy patient. Is the patient's bilirubin elevated CT chest 4/15 > no acute abnormality CT head 4/15 > no acute findings.  Hip X-rays on admit > neg  Micro Data:  Urine Cx 4/15 >  Antimicrobials:    Interim history/subjective:    Objective   Blood pressure (!) 131/104, pulse (!) 148, temperature 99.3 F (37.4 C), temperature source Rectal, resp. rate 18, height 5\' 5"  (1.651 m), weight 77.1 kg, SpO2 100 %.    Vent Mode: PRVC FiO2 (%):  [100 %] 100 % Set Rate:  [18 bmp] 18 bmp Vt Set:  [450 mL] 450 mL PEEP:  [5 cmH20] 5 cmH20 Plateau Pressure:  [14 cmH20] 14 cmH20   Intake/Output Summary (Last 24 hours) at 10/02/2018 1356 Last data filed at 10/02/2018 1200 Gross per 24 hour  Intake 1100 ml  Output -  Net 1100 ml   Filed Weights   10/02/18 1116  Weight: 77.1 kg    Examination: General: Overweight elderly appearing female on vent HENT: [place]/AT, PERRL, no appreciable JVD Lungs: Clear bilaterally Cardiovascular: IRIR tachy. Rates range 110 - 145. No MRG Abdomen: Soft, non-tender, non-distended Extremities: No acute deformity. Pressure related injury to R hip with erythema and bliste.  Neuro: Sedated.  GU: Foley. Tea colored urine.   Resolved Hospital Problem list     Assessment & Plan:   Acute kidney injury: secondary to hypovolemia and rhabdomyolysis.  Hypernatremia: - Aggressive IVF resuscitation (3L in ED in now 140mL/hr) - Trend CK - Trend BMP - If not improved  by 4/16 will involve nephrology to consider HD.   Acute metabolic encephalopathy: likely in the setting of uremia and hypernatremia - Supportive care. Re-evaluation once renal function improved.   Acute hypoxemic respiratory failure - full vent support - ABG - Wean as tolerated - VAP bundle - CXR in AM  Anion gap acidosis: renal failure and lactic - Follow BMP with volume resuscitation.   Mild leukocytosis - Monitor off ABX  Atrial fibrillation with RVR - unclear if history of this  or on anticoagulation - heparin per pharmacy.  - Metoprolol PRN to keep HR < 115 bpm   Best practice:  Diet: NPO - advance OG Pain/Anxiety/Delirium protocol (if indicated): Propofol for RASS goal 0 to -2 VAP protocol (if indicated): Per protocol DVT prophylaxis: heparin infusion GI prophylaxis: PPI Glucose control: SSI Mobility: BR Code Status: FULL Family Communication: no answer at only number in chart. Social work consult asked. Pharmacists attempting to reach out to various pharmacies to determine home meds.  Disposition: ICU  Labs   CBC: Recent Labs  Lab 10/02/18 1100 10/02/18 1122  WBC 11.3*  --   NEUTROABS 9.7*  --   HGB 14.9 13.6  HCT 47.5* 40.0  MCV 89.0  --   PLT 286  --     Basic Metabolic Panel: Recent Labs  Lab 10/02/18 1100 10/02/18 1122  NA 153* 153*  K 3.7 3.6  CL 118*  --   CO2 13*  --   GLUCOSE 168*  --   BUN 70*  --   CREATININE 3.43*  --   CALCIUM 9.1  --    GFR: Estimated Creatinine Clearance: 16.3 mL/min (A) (by C-G formula based on SCr of 3.43 mg/dL (H)). Recent Labs  Lab 10/02/18 1100  WBC 11.3*  LATICACIDVEN 4.5*    Liver Function Tests: Recent Labs  Lab 10/02/18 1100  AST 74*  ALT 43  ALKPHOS 95  BILITOT 2.4*  PROT 6.4*  ALBUMIN 3.4*   No results for input(s): LIPASE, AMYLASE in the last 168 hours. No results for input(s): AMMONIA in the last 168 hours.  ABG    Component Value Date/Time   HCO3 13.1 (L) 10/02/2018 1122   TCO2 14 (L) 10/02/2018 1122   ACIDBASEDEF 14.0 (H) 10/02/2018 1122   O2SAT 97.0 10/02/2018 1122     Coagulation Profile: Recent Labs  Lab 10/02/18 1100  INR 1.2    Cardiac Enzymes: Recent Labs  Lab 10/02/18 1100  CKTOTAL 2,642*  CKMB 42.8*  TROPONINI 0.08*    HbA1C: No results found for: HGBA1C  CBG: No results for input(s): GLUCAP in the last 168 hours.  Review of Systems:   unable  Past Medical History  She,  has no past medical history on file.   Surgical History    History reviewed. No pertinent surgical history.   Social History      Family History   Her family history is not on file.   Allergies Allergies no known allergies   Home Medications  Prior to Admission medications   Not on File     Critical care time: 40 mins     Joneen RoachPaul Alitza Cowman, AGACNP-BC [facility] Pulmonary/Critical Care Pager [phone] or [phone]  10/02/2018 2:31 PM

## 2018-10-02 NOTE — ED Notes (Signed)
ED TO INPATIENT HANDOFF REPORT  ED Nurse Name and Phone #: Marcelino Duster RN [phone]  S Name/Age/Gender Jordan Reed 68 y.o. female Room/Bed: TRABC/TRABC  Code Status   Code Status: DNR  Home/SNF/Other Nursing Home  Is this baseline? No   Triage Complete: Triage complete  Chief Complaint found unresponsive  Triage Note Pt here via EMS after being found in her yard by the mailman.  Unknown down time.  Her caretaker has been in hospital x 4 days. Pt's initial hr was 210, bp 86/40, sats 98% ra. cbg 134.  PT schocked twice en-route. Pressure wounds to R leg as if pt has been laying in same position for quite some time.  Pt moving both arms, able to look towards voice.   Allergies No Known Allergies  Level of Care/Admitting Diagnosis ED Disposition    ED Disposition Condition Comment   Admit  Hospital Area: [facility] [[id number]]  Level of Care: ICU [6]  Diagnosis: Rhabdomyolysis [728.88.ICD-9-CM]  Admitting Physician: Tomma Lightning [[id number]]  Attending Physician: Tomma Lightning [[id number]]  Estimated length of stay: 5 - 7 days  Certification:: I certify this patient will need inpatient services for at least 2 midnights  Possible Covid Disease Patient Isolation: N/A  PT Class (Do Not Modify): Inpatient [101]  PT Acc Code (Do Not Modify): Private [1]       B Medical/Surgery History History reviewed. No pertinent past medical history. History reviewed. No pertinent surgical history.   A IV Location/Drains/Wounds Patient Lines/Drains/Airways Status   Active Line/Drains/Airways    Name:   Placement date:   Placement time:   Site:   Days:   Peripheral IV 10/02/18 Right Wrist   10/02/18    -    Wrist   less than 1   Peripheral IV 10/02/18 Right Antecubital   10/02/18    1035    Antecubital   less than 1   Urethral Catheter Britta Mccreedy RN Temperature probe 14 Fr.   10/02/18    1210    Temperature probe   less than 1   Airway 7.5 mm   10/02/18    1045      less than 1          Intake/Output Last 24 hours  Intake/Output Summary (Last 24 hours) at 10/02/2018 1601 Last data filed at 10/02/2018 1544 Gross per 24 hour  Intake 2100 ml  Output 900 ml  Net 1200 ml    Labs/Imaging Results for orders placed or performed during the hospital encounter of 10/02/18 (from the past 48 hour(s))  Comprehensive metabolic panel     Status: Abnormal   Collection Time: 10/02/18 11:00 AM  Result Value Ref Range   Sodium 153 (H) 135 - 145 mmol/L   Potassium 3.7 3.5 - 5.1 mmol/L   Chloride 118 (H) 98 - 111 mmol/L   CO2 13 (L) 22 - 32 mmol/L   Glucose, Bld 168 (H) 70 - 99 mg/dL   BUN 70 (H) 8 - 23 mg/dL   Creatinine, Ser 8.29 (H) 0.44 - 1.00 mg/dL   Calcium 9.1 8.9 - 56.2 mg/dL   Total Protein 6.4 (L) 6.5 - 8.1 g/dL   Albumin 3.4 (L) 3.5 - 5.0 g/dL   AST 74 (H) 15 - 41 U/L   ALT 43 0 - 44 U/L   Alkaline Phosphatase 95 38 - 126 U/L   Total Bilirubin 2.4 (H) 0.3 - 1.2 mg/dL   GFR calc non Af Amer 13 (  L) >60 mL/min   GFR calc Af Amer 15 (L) >60 mL/min   Anion gap 22 (H) 5 - 15    Comment: Performed at [facility], [address] [place], [place] [address]  Lactic acid, plasma     Status: Abnormal   Collection Time: 10/02/18 11:00 AM  Result Value Ref Range   Lactic Acid, Venous 4.5 (HH) 0.5 - 1.9 mmol/L    Comment: CRITICAL RESULT CALLED TO, READ BACK BY AND VERIFIED WITH: [facility],B RN @ 1157 10/02/18 LEONARD,A Performed at [facility], [address] [place], [place] [address]   CBC with Differential     Status: Abnormal   Collection Time: 10/02/18 11:00 AM  Result Value Ref Range   WBC 11.3 (H) 4.0 - 10.5 K/uL   RBC 5.34 (H) 3.87 - 5.11 MIL/uL   Hemoglobin 14.9 12.0 - 15.0 g/dL   HCT 29.547.5 (H) 28.436.0 - 13.246.0 %   MCV 89.0 80.0 - 100.0 fL   MCH 27.9 26.0 - 34.0 pg   MCHC 31.4 30.0 - 36.0 g/dL   RDW 44.014.6 10.211.5 - 72.515.5 %   Platelets 286 150 - 400 K/uL   nRBC 0.0 0.0 - 0.2 %   Neutrophils Relative % 85 %   Neutro Abs 9.7 (H) 1.7 -  7.7 K/uL   Lymphocytes Relative 7 %   Lymphs Abs 0.7 0.7 - 4.0 K/uL   Monocytes Relative 7 %   Monocytes Absolute 0.8 0.1 - 1.0 K/uL   Eosinophils Relative 0 %   Eosinophils Absolute 0.0 0.0 - 0.5 K/uL   Basophils Relative 0 %   Basophils Absolute 0.0 0.0 - 0.1 K/uL   Immature Granulocytes 1 %   Abs Immature Granulocytes 0.07 0.00 - 0.07 K/uL    Comment: Performed at [facility], [address] [place], [place] [address]  Protime-INR     Status: Abnormal   Collection Time: 10/02/18 11:00 AM  Result Value Ref Range   Prothrombin Time 15.4 (H) 11.4 - 15.2 seconds   INR 1.2 0.8 - 1.2    Comment: (NOTE) INR goal varies based on device and disease states. Performed at [facility], [address] [place], [place] [address]   Urinalysis, Routine w reflex microscopic     Status: Abnormal   Collection Time: 10/02/18 11:00 AM  Result Value Ref Range   Color, Urine AMBER (A) YELLOW    Comment: BIOCHEMICALS MAY BE AFFECTED BY COLOR   APPearance CLOUDY (A) CLEAR   Specific Gravity, Urine 1.020 1.005 - 1.030   pH 6.0 5.0 - 8.0   Glucose, UA NEGATIVE NEGATIVE mg/dL   Hgb urine dipstick LARGE (A) NEGATIVE   Bilirubin Urine NEGATIVE NEGATIVE   Ketones, ur 20 (A) NEGATIVE mg/dL   Protein, ur 259100 (A) NEGATIVE mg/dL   Nitrite NEGATIVE NEGATIVE   Leukocytes,Ua NEGATIVE NEGATIVE   RBC / HPF >50 (H) 0 - 5 RBC/hpf   WBC, UA 21-50 0 - 5 WBC/hpf   Bacteria, UA RARE (A) NONE SEEN   Mucus PRESENT    Budding Yeast PRESENT     Comment: Performed at [facility], [address] [place], [place] [address]  CK total and CKMB (cardiac) not at [facility]     Status: Abnormal   Collection Time: 10/02/18 11:00 AM  Result Value Ref Range   Total CK 2,642 (H) 38 - 234 U/L   CK, MB 42.8 (H) 0.5 - 5.0 ng/mL  Relative Index 1.6 0.0 - 2.5    Comment: Performed at [facility], [address] [facility] [place] [address]  Troponin I - ONCE - STAT     Status: Abnormal   Collection Time:  10/02/18 11:00 AM  Result Value Ref Range   Troponin I 0.08 (HH) <0.03 ng/mL    Comment: CRITICAL RESULT CALLED TO, READ BACK BY AND VERIFIED WITH: COFFEE,M RN @ 1224 10/02/18 LEONARD,A Performed at [facility], [address], [place], [place] [address]   POCT I-Stat EG7     Status: Abnormal   Collection Time: 10/02/18 11:22 AM  Result Value Ref Range   pH, Ven 7.207 (L) 7.250 - 7.430   pCO2, Ven 33.0 (L) 44.0 - 60.0 mmHg   pO2, Ven 103.0 (H) 32.0 - 45.0 mmHg   Bicarbonate 13.1 (L) 20.0 - 28.0 mmol/L   TCO2 14 (L) 22 - 32 mmol/L   O2 Saturation 97.0 %   Acid-base deficit 14.0 (H) 0.0 - 2.0 mmol/L   Sodium 153 (H) 135 - 145 mmol/L   Potassium 3.6 3.5 - 5.1 mmol/L   Calcium, Ion 1.13 (L) 1.15 - 1.40 mmol/L   HCT 40.0 36.0 - 46.0 %   Hemoglobin 13.6 12.0 - 15.0 g/dL   Patient temperature HIDE    Sample type VENOUS   I-STAT 7, (LYTES, BLD GAS, ICA, H+H)     Status: Abnormal   Collection Time: 10/02/18 12:34 PM  Result Value Ref Range   pH, Arterial 7.223 (L) 7.350 - 7.450   pCO2 arterial 40.1 32.0 - 48.0 mmHg   pO2, Arterial 592.0 (H) 83.0 - 108.0 mmHg   Bicarbonate 16.5 (L) 20.0 - 28.0 mmol/L   TCO2 18 (L) 22 - 32 mmol/L   O2 Saturation 100.0 %   Acid-base deficit 11.0 (H) 0.0 - 2.0 mmol/L   Sodium 153 (H) 135 - 145 mmol/L   Potassium 2.8 (L) 3.5 - 5.1 mmol/L   Calcium, Ion 1.22 1.15 - 1.40 mmol/L   HCT 35.0 (L) 36.0 - 46.0 %   Hemoglobin 11.9 (L) 12.0 - 15.0 g/dL   Patient temperature HIDE    Collection site RADIAL, ALLEN'S TEST ACCEPTABLE    Sample type ARTERIAL    Ct Abdomen Pelvis Wo Contrast  Result Date: 10/02/2018 CLINICAL DATA:  Trauma to the chest and flank. The patient was found down. EXAM: CT ABDOMEN AND PELVIS WITHOUT CONTRAST TECHNIQUE: Multidetector CT imaging of the abdomen and pelvis was performed following the standard protocol without IV contrast. COMPARISON:  None. FINDINGS: Lower chest: Minimal atelectasis at the posterior aspect of the right lung  base. Hepatobiliary: Attic parenchyma is normal. Full a cystectomy. The common bile duct is dilated to a diameter of 15 mm, above normal limits for a post cholecystectomy patient. However, there is no visible mass or stone in the distal duct. Is the patient's bilirubin elevated? Pancreas: Unremarkable. No pancreatic ductal dilatation or surrounding inflammatory changes. Spleen: No splenic injury or perisplenic hematoma. Adrenals/Urinary Tract: Adrenal glands are normal. Slight prominence of the right pelvis. Kidneys are otherwise normal. No hydronephrosis. Bladder is normal. Stomach/Bowel: Multiple surgical clips in the left upper quadrant adjacent to the fundus of the stomach. NG tube tip is in the fundus of the stomach. Bowel otherwise appears normal including the terminal ileum and appendix. Vascular/Lymphatic: Aortic atherosclerosis. No enlarged abdominal or pelvic lymph nodes. Reproductive: Uterus and bilateral adnexa are unremarkable. Other: Tiny periumbilical hernia containing only fat. The defect in the  anterior abdominal wall is only 13 mm. No ascites. Musculoskeletal: No acute abnormality. 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 Chronic diffuse degenerative disc disease from L1-2 through L5-S1. IMPRESSION: 1. No acute abnormality. 2. Dilatation of the common bile duct 15 mm, above normal limits for a post cholecystectomy patient. Is the patient's bilirubin elevated? 3.  Aortic Atherosclerosis (ICD10-I70.0). Electronically Signed   By: Francene Boyers M.D.   On: 10/02/2018 12:35   Ct Head Wo Contrast  Result Date: 10/02/2018 CLINICAL DATA:  Found down outside house. EXAM: CT HEAD WITHOUT CONTRAST TECHNIQUE: Contiguous axial images were obtained from the base of the skull through the vertex without intravenous contrast. COMPARISON:  CT head 03/02/2012. FINDINGS: Brain: There is no evidence of acute intracranial hemorrhage, mass lesion, brain edema or extra-axial  fluid collection. There is progressive atrophy with prominence of the ventricles and subarachnoid spaces. There is mild patchy low-density in the periventricular white matter, likely due to chronic small vessel ischemic changes. There is no CT evidence of acute cortical infarction. Vascular: Diffuse intracranial vascular calcifications. No hyperdense vessel identified. Skull: Negative for fracture or focal lesion. Sinuses/Orbits: The visualized paranasal sinuses and mastoid air cells are clear. No orbital abnormalities are seen. Other: Mucosal thickening in the nasal passages and fluid opacification of the nasopharynx attributed to endotracheal intubation. IMPRESSION: 1. No acute intracranial findings. 2. Progressive cerebral atrophy from 2013. Electronically Signed   By: Carey Bullocks M.D.   On: 10/02/2018 12:11   Ct Chest Wo Contrast  Result Date: 10/02/2018 CLINICAL DATA:  Patient was found down.  Right chest trauma. EXAM: CT CHEST WITHOUT CONTRAST TECHNIQUE: Multidetector CT imaging of the chest was performed following the standard protocol without IV contrast. COMPARISON:  Chest x-ray dated 10/02/2018 FINDINGS: Cardiovascular: Aortic atherosclerosis. Heart size is normal. No pericardial effusion. Mediastinum/Nodes: Endotracheal tube tip is just above the carina and could be retracted 2-3 cm. NG tube tip is in the fundus of the stomach and could be advanced no adenopathy. Thyroid gland and trachea and esophagus appear normal. Lungs/Pleura: No infiltrates or effusions. Minimal atelectasis at the right lung base posteriorly. Upper Abdomen: No acute abnormality. Musculoskeletal: No acute abnormality. IMPRESSION: 1. No acute abnormality of the chest. 2. Endotracheal tube is at the level of the carina and could be retracted approximately 2-3 cm. 3. NG tube tip is in the fundus of the stomach and could be slightly advanced. 4. Critical Value/emergent results were called by telephone at the time of interpretation  on 10/02/2018 at 12:27 pm to Dr. Kennis Carina , who verbally acknowledged these results. Electronically Signed   By: Francene Boyers M.D.   On: 10/02/2018 12:27   Dg Pelvis Portable  Result Date: 10/02/2018 CLINICAL DATA:  Unwitnessed fall. EXAM: PORTABLE PELVIS 1-2 VIEWS COMPARISON:  None. FINDINGS: There is no evidence of pelvic fracture or diastasis. No pelvic bone lesions are seen. IMPRESSION: Negative. Electronically Signed   By: Lupita Raider M.D.   On: 10/02/2018 11:21   Dg Chest Portable 1 View  Result Date: 10/02/2018 CLINICAL DATA:  Found down in front yard. Unresponsive. Endotracheal tube placement. EXAM: PORTABLE CHEST 1 VIEW COMPARISON:  None. FINDINGS: The heart size is normal. The patient is intubated. Endotracheal tube terminates 2 cm from the carina. The patient is slightly rotated to the left, exaggerating the mediastinum on the right. Biapical airspace opacities are present. Changes of COPD are noted. The visualized soft tissues and bony thorax are unremarkable.  IMPRESSION: 1. Endotracheal tube terminates 2 cm from the carina. 2. Biapical interstitial prominence is likely chronic. 3. No acute cardiopulmonary disease. Electronically Signed   By: Marin Roberts M.D.   On: 10/02/2018 11:21   Dg Femur Port, Min 2 Views Right  Result Date: 10/02/2018 CLINICAL DATA:  Found in yard unresponsive. Trauma. EXAM: RIGHT FEMUR PORTABLE 1 VIEW COMPARISON:  None. FINDINGS: Portable AP radiographs of the right femur are provided. No acute fracture is identified. Right knee arthroplasty is incompletely evaluated. Atherosclerotic vascular calcifications are noted. IMPRESSION: No acute osseous abnormality identified. Electronically Signed   By: Sebastian Ache M.D.   On: 10/02/2018 11:20    Pending Labs Unresulted Labs (From admission, onward)    Start     Ordered   10/03/18 0500  CBC  Tomorrow morning,   R     10/02/18 1354   10/03/18 0500  Basic metabolic panel  Tomorrow morning,   R      10/02/18 1354   10/03/18 0500  Magnesium  Tomorrow morning,   R     10/02/18 1354   10/03/18 0500  Phosphorus  Tomorrow morning,   R     10/02/18 1354   10/03/18 0500  Triglycerides  (propofol (DIPRIVAN))  Daily,   R    Comments:  While on propofol (DIPRIVAN)    10/02/18 1426   10/03/18 0500  Heparin level (unfractionated)  Daily,   R     10/02/18 1434   10/03/18 0500  CBC  Daily,   R     10/02/18 1434   10/02/18 2300  Heparin level (unfractionated)  Once-Timed,   R     10/02/18 1434   10/02/18 2300  APTT  Once-Timed,   R     10/02/18 1434   10/02/18 1558  Urine culture  Once,   R    Question:  List patient's active antibiotics  Answer:  Uc already in lab   10/02/18 1558   10/02/18 1353  HIV antibody (Routine Testing)  Add-on,   R     10/02/18 1354   10/02/18 1229  Blood gas, arterial  Once,   R     10/02/18 1228   10/02/18 1046  Culture, blood (Routine x 2)  BLOOD CULTURE X 2,   STAT     10/02/18 1046          Vitals/Pain Today's Vitals   10/02/18 1500 10/02/18 1515 10/02/18 1524 10/02/18 1530  BP: (!) 124/102 (!) 131/91  119/88  Pulse: (!) 137 (!) 102 (!) 154 (!) 138  Resp: Temp:      TempSrc:      SpO2: 100% 100% 100% 100%  Weight:      Height:        Isolation Precautions No active isolations  Medications Medications  sodium chloride flush (NS) 0.9 % injection 3 mL (has no administration in time range)  pantoprazole (PROTONIX) injection 40 mg (has no administration in time range)  0.9 %  sodium chloride infusion (has no administration in time range)  metoprolol tartrate (LOPRESSOR) injection 2.5-5 mg (5 mg Intravenous Given 10/02/18 1547)  fentaNYL (SUBLIMAZE) injection 25 mcg (has no administration in time range)  fentaNYL (SUBLIMAZE) injection 25-100 mcg (has no administration in time range)  propofol (DIPRIVAN) 1000 MG/100ML infusion (has no administration in time range)  heparin ADULT infusion 100 units/mL (25000 units/270mL sodium chloride  0.45%) (1,000 Units/hr Intravenous New Bag/Given 10/02/18 1532)  insulin aspart (  novoLOG) injection 0-15 Units (has no administration in time range)  etomidate (AMIDATE) injection (20 mg Intravenous Given 10/02/18 1040)  rocuronium (ZEMURON) injection (80 mg Intravenous Given 10/02/18 1042)  sodium chloride 0.9 % bolus 1,000 mL (0 mLs Intravenous Stopped 10/02/18 1419)  sodium chloride 0.9 % bolus 1,000 mL (0 mLs Intravenous Stopped 10/02/18 1200)  cefTRIAXone (ROCEPHIN) 2 g in sodium chloride 0.9 % 100 mL IVPB (0 g Intravenous Stopped 10/02/18 1144)  0.9 %  sodium chloride infusion ( Intravenous New Bag/Given 10/02/18 1245)  heparin bolus via infusion 3,600 Units (3,600 Units Intravenous Bolus from Bag 10/02/18 1535)    Mobility Unable to assess  High fall risk   Focused Assessments Cardiac Assessment Handoff:    Lab Results  Component Value Date   CKTOTAL 2,642 (H) 10/02/2018   CKMB 42.8 (H) 10/02/2018   TROPONINI 0.08 (HH) 10/02/2018   No results found for: DDIMER Does the Patient currently have chest pain? No  , Neuro Assessment Handoff:  Swallow screen pass? none attempted         Neuro Assessment:   Neuro Checks:      Last Documented NIHSS Modified Score:   Has TPA been given? No If patient is a Neuro Trauma and patient is going to OR before floor call report to 4N Charge nurse: [phone] or [phone]     R Recommendations: See Admitting Provider Note  Report given to:   Additional Notes:

## 2018-10-02 NOTE — Progress Notes (Signed)
ANTICOAGULATION CONSULT NOTE - Initial Consult  Pharmacy Consult for heparin Indication: atrial fibrillation  Allergies no known allergies  Patient Measurements: Height: 5\' 5"  (165.1 cm) Weight: 170 lb (77.1 kg) IBW/kg (Calculated) : 57 Heparin Dosing Weight: 73kg  Vital Signs: Temp: 99.3 F (37.4 C) (04/15 1100) Temp Source: Rectal (04/15 1100) BP: 131/104 (04/15 1345) Pulse Rate: 148 (04/15 1345)  Labs: Recent Labs    10/02/18 1100 10/02/18 1122  HGB 14.9 13.6  HCT 47.5* 40.0  PLT 286  --   LABPROT 15.4*  --   INR 1.2  --   CREATININE 3.43*  --   CKTOTAL 2,642*  --   CKMB 42.8*  --   TROPONINI 0.08*  --     Estimated Creatinine Clearance: 16.3 mL/min (A) (by C-G formula based on SCr of 3.43 mg/dL (H)).   Medical History: History reviewed. No pertinent past medical history.  Medications:  Infusions:  . sodium chloride    . sodium chloride    . heparin    . propofol (DIPRIVAN) infusion      Assessment: 63 yof presented to the ED with tachycardia and altered mental status. To start IV heparin for afib. Baseline CBC is WNL. INR is 1.2 and troponin is elevated. It is unknown if she is on anticoagulation PTA.   Goal of Therapy:  Heparin level 0.3-0.7 units/ml Monitor platelets by anticoagulation protocol: Yes   Plan:  Heparin bolus 3600 units IV x 1 Heparin gtt 1000 units/hr Check an 8 hr heparin level and aPTT Daily heparin level and CBC  Aluel Schwarz, Drake Leach 10/02/2018,2:35 PM

## 2018-10-03 ENCOUNTER — Inpatient Hospital Stay (HOSPITAL_COMMUNITY): Payer: Medicare HMO

## 2018-10-03 DIAGNOSIS — I4891 Unspecified atrial fibrillation: Secondary | ICD-10-CM

## 2018-10-03 DIAGNOSIS — R4182 Altered mental status, unspecified: Secondary | ICD-10-CM

## 2018-10-03 LAB — BASIC METABOLIC PANEL
Anion gap: 12 (ref 5–15)
Anion gap: 14 (ref 5–15)
BUN: 71 mg/dL — ABNORMAL HIGH (ref 8–23)
BUN: 71 mg/dL — ABNORMAL HIGH (ref 8–23)
CO2: 16 mmol/L — ABNORMAL LOW (ref 22–32)
CO2: 15 mmol/L — ABNORMAL LOW (ref 22–32)
Calcium: 8.7 mg/dL — ABNORMAL LOW (ref 8.9–10.3)
Calcium: 8.8 mg/dL — ABNORMAL LOW (ref 8.9–10.3)
Chloride: 124 mmol/L — ABNORMAL HIGH (ref 98–111)
Chloride: 123 mmol/L — ABNORMAL HIGH (ref 98–111)
Creatinine, Ser: 2.44 mg/dL — ABNORMAL HIGH (ref 0.44–1.00)
Creatinine, Ser: 2.1 mg/dL — ABNORMAL HIGH (ref 0.44–1.00)
GFR calc Af Amer: 23 mL/min — ABNORMAL LOW (ref >60)
GFR calc Af Amer: 28 mL/min — ABNORMAL LOW (ref >60)
GFR calc non Af Amer: 20 mL/min — ABNORMAL LOW (ref >60)
GFR calc non Af Amer: 24 mL/min — ABNORMAL LOW (ref >60)
Glucose, Bld: 137 mg/dL — ABNORMAL HIGH (ref 70–99)
Glucose, Bld: 164 mg/dL — ABNORMAL HIGH (ref 70–99)
Potassium: 3.5 mmol/L (ref 3.5–5.1)
Potassium: 3.6 mmol/L (ref 3.5–5.1)
Sodium: 152 mmol/L — ABNORMAL HIGH (ref 135–145)
Sodium: 152 mmol/L — ABNORMAL HIGH (ref 135–145)

## 2018-10-03 LAB — CBC
HCT: 38.4 % (ref 36.0–46.0)
Hemoglobin: 12.9 g/dL (ref 12.0–15.0)
MCH: 29.2 pg (ref 26.0–34.0)
MCHC: 33.6 g/dL (ref 30.0–36.0)
MCV: 86.9 fL (ref 80.0–100.0)
Platelets: 213 10*3/uL (ref 150–400)
RBC: 4.42 MIL/uL (ref 3.87–5.11)
RDW: 14.9 % (ref 11.5–15.5)
WBC: 12.1 10*3/uL — ABNORMAL HIGH (ref 4.0–10.5)
nRBC: 0 % (ref 0.0–0.2)

## 2018-10-03 LAB — MAGNESIUM
Magnesium: 2.1 mg/dL (ref 1.7–2.4)
Magnesium: 2.2 mg/dL (ref 1.7–2.4)
Magnesium: 2.3 mg/dL (ref 1.7–2.4)
Magnesium: 2.2 mg/dL (ref 1.7–2.4)

## 2018-10-03 LAB — APTT: aPTT: 85 seconds — ABNORMAL HIGH (ref 24–36)

## 2018-10-03 LAB — HEPARIN LEVEL (UNFRACTIONATED)
Heparin Unfractionated: 0.38 IU/mL (ref 0.30–0.70)
Heparin Unfractionated: 0.49 IU/mL (ref 0.30–0.70)

## 2018-10-03 LAB — HIV ANTIBODY (ROUTINE TESTING W REFLEX): HIV Screen 4th Generation wRfx: NONREACTIVE

## 2018-10-03 LAB — ECHOCARDIOGRAM COMPLETE
Height: 65 in
Weight: 2370.39 oz

## 2018-10-03 LAB — HEPATIC FUNCTION PANEL
ALT: 37 U/L (ref 0–44)
AST: 49 U/L — ABNORMAL HIGH (ref 15–41)
Albumin: 2.2 g/dL — ABNORMAL LOW (ref 3.5–5.0)
Alkaline Phosphatase: 71 U/L (ref 38–126)
Bilirubin, Direct: 0.1 mg/dL (ref 0.0–0.2)
Indirect Bilirubin: 0.3 mg/dL (ref 0.3–0.9)
Total Bilirubin: 0.4 mg/dL (ref 0.3–1.2)
Total Protein: 4.7 g/dL — ABNORMAL LOW (ref 6.5–8.1)

## 2018-10-03 LAB — PHOSPHORUS
Phosphorus: 4.9 mg/dL — ABNORMAL HIGH (ref 2.5–4.6)
Phosphorus: 4.3 mg/dL (ref 2.5–4.6)
Phosphorus: 3.6 mg/dL (ref 2.5–4.6)

## 2018-10-03 LAB — TSH: TSH: <0.01 u[IU]/mL — ABNORMAL LOW (ref 0.350–4.500)

## 2018-10-03 LAB — GLUCOSE, CAPILLARY
Glucose-Capillary: 135 mg/dL — ABNORMAL HIGH (ref 70–99)
Glucose-Capillary: 110 mg/dL — ABNORMAL HIGH (ref 70–99)
Glucose-Capillary: 123 mg/dL — ABNORMAL HIGH (ref 70–99)
Glucose-Capillary: 165 mg/dL — ABNORMAL HIGH (ref 70–99)
Glucose-Capillary: 126 mg/dL — ABNORMAL HIGH (ref 70–99)

## 2018-10-03 LAB — MRSA PCR SCREENING: MRSA by PCR: NEGATIVE

## 2018-10-03 LAB — T4, FREE: Free T4: 4.07 ng/dL — ABNORMAL HIGH (ref 0.82–1.77)

## 2018-10-03 LAB — CK: Total CK: 1594 U/L — ABNORMAL HIGH (ref 38–234)

## 2018-10-03 LAB — TRIGLYCERIDES: Triglycerides: 75 mg/dL (ref <150)

## 2018-10-03 LAB — LACTIC ACID, PLASMA: Lactic Acid, Venous: 1.2 mmol/L (ref 0.5–1.9)

## 2018-10-03 MED ORDER — SODIUM CHLORIDE 0.45 % IV SOLN
INTRAVENOUS | Status: DC
  Administered 2018-10-03 – 2018-10-05 (×5): via INTRAVENOUS

## 2018-10-03 MED ORDER — PRO-STAT SUGAR FREE PO LIQD
60.0000 mL | Freq: Two times a day (BID) | ORAL | Status: DC
  Administered 2018-10-03 – 2018-10-04 (×2): 60 mL
  Filled 2018-10-03 (×2): qty 60

## 2018-10-03 MED ORDER — FREE WATER
200.0000 mL | Freq: Three times a day (TID) | Status: DC
  Administered 2018-10-03 – 2018-10-09 (×14): 200 mL

## 2018-10-03 MED ORDER — ADULT MULTIVITAMIN LIQUID CH
15.0000 mL | Freq: Every day | ORAL | Status: DC
  Administered 2018-10-03 – 2018-10-04 (×2): 15 mL via ORAL
  Filled 2018-10-03 (×2): qty 15

## 2018-10-03 MED ORDER — OSMOLITE 1.5 CAL PO LIQD
1000.0000 mL | ORAL | Status: DC
  Administered 2018-10-03: 14:00:00 1000 mL
  Filled 2018-10-03 (×2): qty 1000

## 2018-10-03 MED ORDER — VITAL HIGH PROTEIN PO LIQD: 1000.0000 mL | ORAL | Status: DC

## 2018-10-03 MED ORDER — PRO-STAT SUGAR FREE PO LIQD: 30.0000 mL | Freq: Two times a day (BID) | ORAL | Status: DC

## 2018-10-03 NOTE — Progress Notes (Signed)
  Echocardiogram 2D Echocardiogram has been performed.  Pieter Partridge 10/03/2018, 11:06 AM

## 2018-10-03 NOTE — Progress Notes (Signed)
ANTICOAGULATION CONSULT NOTE   Pharmacy Consult for heparin Indication: atrial fibrillation  No Known Allergies  Patient Measurements: Height: 5\' 5"  (165.1 cm) Weight: 170 lb (77.1 kg) IBW/kg (Calculated) : 57 Heparin Dosing Weight: 73kg  Vital Signs: Temp: 98.6 F (37 C) (04/16 0100) Temp Source: Esophageal (04/16 0000) BP: 104/57 (04/16 0100) Pulse Rate: 101 (04/16 0100)  Labs: Recent Labs    10/02/18 1100 10/02/18 1122 10/02/18 1234 10/02/18 1833 10/02/18 1839 10/02/18 2325  HGB 14.9 13.6 11.9*  --  11.9*  --   HCT 47.5* 40.0 35.0*  --  35.0*  --   PLT 286  --   --   --   --   --   APTT  --   --   --   --   --  85*  LABPROT 15.4*  --   --   --   --   --   INR 1.2  --   --   --   --   --   HEPARINUNFRC  --   --   --   --   --  0.38  CREATININE 3.43*  --   --  2.65*  --   --   CKTOTAL 2,642*  --   --   --   --   --   CKMB 42.8*  --   --   --   --   --   TROPONINI 0.08*  --   --   --   --   --     Estimated Creatinine Clearance: 21.1 mL/min (A) (by C-G formula based on SCr of 2.65 mg/dL (H)).   Medical History: History reviewed. No pertinent past medical history.  Medications:  Infusions:  . sodium chloride 100 mL/hr at 10/03/18 0100  . amiodarone 60 mg/hr (10/03/18 0100)   Followed by  . amiodarone    . heparin 1,000 Units/hr (10/03/18 0100)  . propofol (DIPRIVAN) infusion 15 mcg/kg/min (10/03/18 0100)    Assessment: 67 yof presented to the ED with tachycardia and altered mental status. To start IV heparin for afib. Baseline CBC is WNL. INR is 1.2 and troponin is elevated. It is unknown if she is on anticoagulation PTA.  Initial heparin level 0.38 units/ml, aPTT 85 sec  Goal of Therapy:  Heparin level 0.3-0.7 units/ml Monitor platelets by anticoagulation protocol: Yes   Plan:  Continue heparin gtt 1000 units/hr Daily heparin level and CBC  Feliciana Narayan Poteet 10/03/2018,1:04 AM

## 2018-10-03 NOTE — Progress Notes (Addendum)
Initial Nutrition Assessment  DOCUMENTATION CODES:   Not applicable  INTERVENTION:   Once OGT is advanced:  -Osmolite 1.5 @ 35 ml/hr (840 ml) -60 ml Prostat BID -Liquid MVI daily -Free water flushes 200 ml TID per CCM- recommend increasing to 200 ml Q4 hours once off IVF.   Provides: 1660 kcals (1692 kcal with propofol), 113 grams protein, 640 ml free water (1240 ml with flushes). Meets 116% of calorie needs and 100% of protein needs.   NUTRITION DIAGNOSIS:   Increased nutrient needs related to wound healing as evidenced by estimated needs.  GOAL:   Patient will meet greater than or equal to 90% of their needs  MONITOR:   Diet advancement, Weight trends, Labs, Vent status, TF tolerance, Skin, I & O's  REASON FOR ASSESSMENT:   Consult, Ventilator Enteral/tube feeding initiation and management  ASSESSMENT:   Patient with no PMH on file. Presents this admission with ARF from dehydration/rhabdomyolysis.    RD working remotely.  Spoke with RN via phone. Pt requiring small dose of propofol for sedation. Plan to wean amiodarone today. Pt's last chest xray this am suggested OGT to be advanced. Nursing aware. Plan to start TF after second xray confirmation of gastric placement.   Weight records limited for pt over the year. Will utilize EDW of 67.2 kg to estimate needs.   Unable to complete Nutrition-Focused physical exam at this time.   Patient is currently intubated on ventilator support MV: 12.4 L/min Temp (24hrs), Avg:98 F (36.7 C), Min:93 F (33.9 C), Max:99.7 F (37.6 C)  Propofol: 1.2 ml/hr- provides 32 kcal   I/O: +3,563 ml since admit UOP: 1,135 ml x 24 hrs OGT: 110 ml x 24 hrs   Drips: amiodarone, propofol Medications: SS novolog Labs: Na 152 (H) Phosphorus 4.9 (H) Creatinine 2.10- trending down  Diet Order:   Diet Order            Diet NPO time specified  Diet effective now              EDUCATION NEEDS:   Not appropriate for education at  this time  Skin:  Skin Assessment: Skin Integrity Issues: Skin Integrity Issues:: Stage I, DTI DTI: R ear, R shoulder, R hip, R knee, R elbow Stage I: sacrum  Last BM:  4/16  Height:   Ht Readings from Last 1 Encounters:  10/03/18 5\' 5"  (1.651 m)    Weight:   Wt Readings from Last 1 Encounters:  10/03/18 67.2 kg    Ideal Body Weight:  56.8 kg  BMI:  Body mass index is 24.65 kg/m.  Estimated Nutritional Needs:   Kcal:  1461 kcal  Protein:  100-120 grams  Fluid:  >/= 1.5 L/day  Vanessa Kick RD, LDN Clinical Nutrition Pager # - [phone]

## 2018-10-03 NOTE — Progress Notes (Addendum)
NAME:  Jordan Reed, MRN:  [id number], DOB:  07/27/1950, LOS: 1 ADMISSION DATE:  10/02/2018, CONSULTATION DATE: 10/02/2018 REFERRING MD: Dr. Pilar PlateBero, CHIEF COMPLAINT: Altered mental status  Brief History   68 year old female found down with altered mental status admitted 4/15 with acute renal failure from dehydration/rhabdomyolysis.  Intubated in the ED for airway protection.  Past Medical History   has no past medical history on file.  Significant Hospital Events   4/15 admit 4/16: no longer in AF w/ RVR, back in NSR. Na slowly improving, IVFs changed to `1/2 NS; scr improved.  Consults:    Procedures:  ETT 4/15 >  Significant Diagnostic Tests:  CT abdomen 4/15 > No acute abnormality. Dilatation of the common bile duct 15 mm, above normal limits for a post cholecystectomy patient. Is the patient's bilirubin elevated CT chest 4/15 > no acute abnormality CT head 4/15 > no acute findings.  Hip X-rays on admit > neg  Micro Data:  Urine Cx 4/15 >  Antimicrobials:    Interim history/subjective:  She will briskly localize but not follow commands  Objective   Blood pressure 117/63, pulse (Abnormal) 117, temperature 99.7 F (37.6 C), resp. rate (Abnormal) 28, height 5\' 5"  (1.651 m), weight 67.2 kg, SpO2 100 %.    Vent Mode: CPAP;PSV FiO2 (%):  [40 %-100 %] 40 % Set Rate:  [18 bmp] 18 bmp Vt Set:  [450 mL] 450 mL PEEP:  [5 cmH20] 5 cmH20 Pressure Support:  [8 cmH20] 8 cmH20 Plateau Pressure:  [12 cmH20-17 cmH20] 17 cmH20   Intake/Output Summary (Last 24 hours) at 10/03/2018 0957 Last data filed at 10/03/2018 0930 Gross per 24 hour  Intake 4778.32 ml  Output 1330 ml  Net 3448.32 ml   Filed Weights   10/02/18 1116 10/03/18 0400  Weight: 77.1 kg 67.2 kg    Examination: General: This is a chronically ill-appearing 68 year old poorly kempt female she appears much older than stated age she remains intubated HEENT normocephalic atraumatic orally intubated mucous membranes are  moist currently Pulmonary: Clear to auscultation diminished bases no accessory use tidal volume 500-ish on pressure support of 8 cm water Cardiac: Tachycardic regular rate and rhythm currently sinus tachycardia Abdomen: Soft nontender Extremities: Warm, palpable pulses.  Dependent edema multiple pressure wounds dressed Neuro: Opens eyes to loud voice and gentle physical stimulus briskly localizes  Resolved Hospital Problem list     Assessment & Plan:   Acute kidney injury: secondary to hypovolemia and rhabdomyolysis.  -scr improved. As had total CKs Plan Cont IV hydration Renal dose meds Strict intake and output Ensure mean arterial pressure greater than 65  Fluid and electrolyte imbalance 2/2 dehydration w/ resultant Severe hypernatremia, also has mixed anion gap and NAG metabolic acidosis; suspect somewhat exacerbated ny NaCl resuscitation.  Plan Agree w/ repeat LA (this had been improving)  Agree w/ changing MIVFs to 1/2 NS at 150 cc/hr Serial chemistries.  Add a little free water via NGT as well   Acute metabolic encephalopathy: likely in the setting of uremia and hypernatremia Plan Changing PAD protocol goal to RASS of 0 to -1  Acute hypoxemic respiratory failure w/ RLL ATX Portable chest x-ray personally reviewed: Shows endotracheal tube in satisfactory position there is no cardiomegaly mild basilar atelectasis right lower lobe Plan Cont full vent support  VAP bundle  Pad protocol as above PRN CXR   Atrial fibrillation with RVR - unclear if history of this or on anticoagulation Plan Cont AC Spoke w/ IM. They  will ask cards to see F/u echo Agree can prob stop amio Focus on hydration  Mild leukocytosis Plan Trend cbc and fever curve    Best practice:  Diet: NPO - advance OG Pain/Anxiety/Delirium protocol (if indicated): Propofol for RASS goal 0 to -2 VAP protocol (if indicated): Per protocol DVT prophylaxis: heparin infusion GI prophylaxis: PPI Glucose  control: SSI Mobility: BR Code Status: FULL Family Communication: no answer at only number in chart. Social work consult asked. Pharmacists attempting to reach out to various pharmacies to determine home meds.  Disposition:    Critical care time:     Simonne Martinet ACNP-BC [facility] Pulmonary/Critical Care Pager # [phone] OR # [phone] if no answer  10/03/2018 9:57 AM  Attending note: I have seen and examined the patient. History, labs and imaging reviewed.  Amiodarone started overnight for atrial fibrillation.  In normal sinus rhythm  Blood pressure 115/61, pulse (!) 116, temperature 99.7 F (37.6 C), resp. rate (!) 26, height 5\' 5"  (1.651 m), weight 67.2 kg, SpO2 99 %. Gen:      Chronically ill-appearing HEENT:  EOMI, sclera anicteric Neck:     No masses; no thyromegaly, ET tube Lungs:    Clear to auscultation bilaterally; normal respiratory effort CV:         Regular rate and rhythm; no murmurs Abd:      + bowel sounds; soft, non-tender; no palpable masses, no distension Ext:    No edema; adequate peripheral perfusion Skin:      Warm and dry; no rash Neuro: Sedated, unresponsive  Labs reviewed, significant for Sodium 152, potassium 3.6, BUN/creatinine 31/2.1 CK 1594 WBC 4.1, hemoglobin 12.9, platelets 213  Imaging Chest x-ray 10/03/2018- ET tube in stable portion.  Basilar atelectatic changes.  I have reviewed the images personally.  Assessment/plan: 68 year old with history of Alzheimers dementia, hypertension, diabetes, hypothyroid, anemia found down in her yard.  Apparently her caregiver had been hospitalized for several days hence no one to take care of Found to be in acute kidney injury with rhabdomyolysis.  Intubated for respiratory protection  Respiratory failure Continue vent support, wean as tolerated  Rhabdomyolysis, AKI Continues to improve.  Hydration  Paroxysmal atrial fibrillation Off amiodarone.  Cardiology consulted  History of hypothyroid.   Noted to have very low TSH and high T4. Question if she had overdosed on Synthroid? Follow TSH.  Will need med reconciliation.  CBD dilatation on CT abd Check hepatic panel  The patient is critically ill with multiple organ systems failure and requires high complexity decision making for assessment and support, frequent evaluation and titration of therapies, application of advanced monitoring technologies and extensive interpretation of multiple databases.  Critical care time - 35 mins. This represents my time independent of the NPs time taking care of the pt.  Chilton Greathouse MD [place] [facility] 10/03/2018, 10:56 AM

## 2018-10-03 NOTE — Progress Notes (Signed)
PROGRESS NOTE    Jordan Reed  OZH:[id number] DOB: 1950-09-24 DOA: 10/02/2018 PCP: Patient, No Pcp Per   Brief Narrative:  Per HPI: 68 year old female with no known past medical history.  She has no medical records with [place] and I have queried [place], and [facility] with no files found.  She apparently lives at home but has very active involvement of a caregiver who was unfortunately hospitalized 4 days ago.  Since that time the patient has been by herself.  It is unclear what level of assistance she requires at baseline.  A car driving past her house noticed her to be slumped over on the front porch on 10/02/2018.  EMS was alerted and upon their arrival she had altered mental status.  They hooked her up to EKG and found her to be profoundly tachycardic with a narrow complex rhythm.  They felt as though her symptoms may be secondary to symptomatic SVT.  She was cardioverted twice without effect.  In the emergency department she remained tachycardic.  She was somewhat cooperative was staff and able to give a limited history, however, she became increasingly agitated and required intubation for airway protection.  Laboratory evaluation consistent with dehydration rhabdomyolysis and therefore IV fluid resuscitation was given.  She received 3 L of crystalloid and then was started on infusion at 125 an hour.   Patient admitted for hypoxemic respiratory failure as well as metabolic encephalopathy related to AKI with rhabdomyolysis.  She is also noted to have atrial fibrillation with RVR for which she was started on heparin drip as well as amiodarone.  Assessment & Plan:   Active Problems:   Rhabdomyolysis   1. AKI-prerenal and also associated with rhabdomyolysis.  Continue aggressive fluid resuscitation with half-normal saline on account of hyponatremia.  Free water to be added via NG tube per PCCM as well.  Trend CK in a.m.  Avoid nephrotoxic agents.  Check LFTs in a.m. 2. Atrial fibrillation with  RVR.  Unknown if this is new.  Will obtain 2D echocardiogram.  She has now converted to sinus rhythm and will stop amiodarone drip and continue fluid resuscitation for sinus tachycardia likely related to hypovolemia. 3. Metabolic encephalopathy likely secondary to above.  Continue supportive care for now until medically stable. 4. Hypoxemic respiratory failure status post intubation.  Follow ABGs and vent management per PCCM. 5. History of diabetes.  Hyperglycemia currently well controlled. 6. History of hypertension.  Soft blood pressure readings currently.  Maintain on aggressive IV fluid hydration.   DVT prophylaxis:Heparin drip Code Status: DNR Family Communication: None available Disposition Plan: Continue IV fluid hydration and rate control.  She appears to be profoundly dehydrated and has an element of rhabdomyolysis.   Consultants:   PCCM  Procedures:   None  Antimicrobials:   None   Subjective: Patient seen and evaluated today.  She remains somnolent on the ventilator, but is minimally arousable.  Heart rate remains elevated, but she is in sinus rhythm at this point.  Objective: Vitals:   10/03/18 0900 10/03/18 1000 10/03/18 1100 10/03/18 1141  BP: 117/63 115/61 100/64 100/64  Pulse: (!) 117 (!) 116 (!) 117 (!) 115  Resp: (!) 28 (!) 26 (!) 29 (!) 23  Temp: 99.7 F (37.6 C) 99.7 F (37.6 C) 99.9 F (37.7 C)   TempSrc:      SpO2: 100% 99% 100% 100%  Weight:      Height:        Intake/Output Summary (Last 24 hours)  at 10/03/2018 1146 Last data filed at 10/03/2018 1100 Gross per 24 hour  Intake 4990.53 ml  Output 1350 ml  Net 3640.53 ml   Filed Weights   10/02/18 1116 10/03/18 0400  Weight: 77.1 kg 67.2 kg    Examination:  General exam: Somnolent/unarousable Respiratory system: Clear to auscultation.  Intubated with FiO2 40%. Cardiovascular system: S1 & S2 heard, RRR.  Tachycardic.  No JVD, murmurs, rubs, gallops or clicks. No pedal  edema. Gastrointestinal system: Abdomen is nondistended, soft and nontender. No organomegaly or masses felt. Normal bowel sounds heard. Central nervous system: Somnolent/unarousable Extremities: No significant edema. Skin: No rashes, lesions or ulcers Psychiatry: Cannot be evaluated at this time.    Data Reviewed: I have personally reviewed following labs and imaging studies  CBC: Recent Labs  Lab 10/02/18 1100 10/02/18 1122 10/02/18 1234 10/02/18 1839 10/03/18 0437  WBC 11.3*  --   --   --  12.1*  NEUTROABS 9.7*  --   --   --   --   HGB 14.9 13.6 11.9* 11.9* 12.9  HCT 47.5* 40.0 35.0* 35.0* 38.4  MCV 89.0  --   --   --  86.9  PLT 286  --   --   --  976   Basic Metabolic Panel: Recent Labs  Lab 10/02/18 1100  10/02/18 1234 10/02/18 1833 10/02/18 1839 10/02/18 2325 10/03/18 0437  NA 153*   < > 153* 153* 153* 152* 152*  K 3.7   < > 2.8* 3.1* 2.9* 3.5 3.6  CL 118*  --   --  123*  --  124* 123*  CO2 13*  --   --  16*  --  16* 15*  GLUCOSE 168*  --   --  167*  --  137* 164*  BUN 70*  --   --  70*  --  71* 71*  CREATININE 3.43*  --   --  2.65*  --  2.44* 2.10*  CALCIUM 9.1  --   --  8.6*  --  8.7* 8.8*  MG  --   --   --   --   --  2.1 2.2  PHOS  --   --   --   --   --   --  4.9*   < > = values in this interval not displayed.   GFR: Estimated Creatinine Clearance: 23.4 mL/min (A) (by C-G formula based on SCr of 2.1 mg/dL (H)). Liver Function Tests: Recent Labs  Lab 10/02/18 1100  AST 74*  ALT 43  ALKPHOS 95  BILITOT 2.4*  PROT 6.4*  ALBUMIN 3.4*   No results for input(s): LIPASE, AMYLASE in the last 168 hours. No results for input(s): AMMONIA in the last 168 hours. Coagulation Profile: Recent Labs  Lab 10/02/18 1100  INR 1.2   Cardiac Enzymes: Recent Labs  Lab 10/02/18 1100 10/03/18 0437  CKTOTAL 2,642* 1,594*  CKMB 42.8*  --   TROPONINI 0.08*  --    BNP (last 3 results) No results for input(s): PROBNP in the last 8760 hours. HbA1C: No results  for input(s): HGBA1C in the last 72 hours. CBG: Recent Labs  Lab 10/02/18 1734 10/02/18 2036 10/02/18 2347 10/03/18 0421 10/03/18 0857  GLUCAP 184* 114* 120* 135* 110*   Lipid Profile: Recent Labs    10/03/18 0437  TRIG 75   Thyroid Function Tests: Recent Labs    10/03/18 0437  TSH <0.010*   Anemia Panel: No results for input(s): VITAMINB12, FOLATE,  FERRITIN, TIBC, IRON, RETICCTPCT in the last 72 hours. Sepsis Labs: Recent Labs  Lab 10/02/18 1100  LATICACIDVEN 4.5*    Recent Results (from the past 240 hour(s))  Culture, blood (Routine x 2)     Status: None (Preliminary result)   Collection Time: 10/02/18 11:00 AM  Result Value Ref Range Status   Specimen Description BLOOD LEFT ARM  Final   Special Requests   Final    BOTTLES DRAWN AEROBIC AND ANAEROBIC Blood Culture results may not be optimal due to an inadequate volume of blood received in culture bottles   Culture   Final    NO GROWTH 1 DAY Performed at [facility], [place] [address], [place], [place] [address]    Report Status PENDING  Incomplete  Culture, blood (Routine x 2)     Status: None (Preliminary result)   Collection Time: 10/02/18 11:03 AM  Result Value Ref Range Status   Specimen Description BLOOD LEFT HAND  Final   Special Requests   Final    BOTTLES DRAWN AEROBIC AND ANAEROBIC Blood Culture results may not be optimal due to an inadequate volume of blood received in culture bottles   Culture   Final    NO GROWTH 1 DAY Performed at [facility], [place] [address] [place], [place] [address]    Report Status PENDING  Incomplete  MRSA PCR Screening     Status: None   Collection Time: 10/02/18 11:49 PM  Result Value Ref Range Status   MRSA by PCR NEGATIVE NEGATIVE Final    Comment:        The GeneXpert MRSA Assay (FDA approved for NASAL specimens only), is one component of a comprehensive MRSA colonization surveillance program. It is not intended to diagnose MRSA infection nor to  guide or monitor treatment for MRSA infections. Performed at [facility], [place] [address], [place], [place] [address]          Radiology Studies: Ct Abdomen Pelvis Wo Contrast  Result Date: 10/02/2018 CLINICAL DATA:  Trauma to the chest and flank. The patient was found down. EXAM: CT ABDOMEN AND PELVIS WITHOUT CONTRAST TECHNIQUE: Multidetector CT imaging of the abdomen and pelvis was performed following the standard protocol without IV contrast. COMPARISON:  None. FINDINGS: Lower chest: Minimal atelectasis at the posterior aspect of the right lung base. Hepatobiliary: Attic parenchyma is normal. Full a cystectomy. The common bile duct is dilated to a diameter of 15 mm, above normal limits for a post cholecystectomy patient. However, there is no visible mass or stone in the distal duct. Is the patient's bilirubin elevated? Pancreas: Unremarkable. No pancreatic ductal dilatation or surrounding inflammatory changes. Spleen: No splenic injury or perisplenic hematoma. Adrenals/Urinary Tract: Adrenal glands are normal. Slight prominence of the right pelvis. Kidneys are otherwise normal. No hydronephrosis. Bladder is normal. Stomach/Bowel: Multiple surgical clips in the left upper quadrant adjacent to the fundus of the stomach. NG tube tip is in the fundus of the stomach. Bowel otherwise appears normal including the terminal ileum and appendix. Vascular/Lymphatic: Aortic atherosclerosis. No enlarged abdominal or pelvic lymph nodes. Reproductive: Uterus and bilateral adnexa are unremarkable. Other: Tiny periumbilical hernia containing only fat. The defect in the anterior abdominal wall is only 13 mm. No ascites. Musculoskeletal: No acute abnormality. 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 Chronic diffuse degenerative disc disease from L1-2 through L5-S1. IMPRESSION: 1. No acute abnormality. 2. Dilatation of the common bile duct 15  mm, above normal limits for a  post cholecystectomy patient. Is the patient's bilirubin elevated? 3.  Aortic Atherosclerosis (ICD10-I70.0). Electronically Signed   By: Lorriane Shire M.D.   On: 10/02/2018 12:35   Ct Head Wo Contrast  Result Date: 10/02/2018 CLINICAL DATA:  Found down outside house. EXAM: CT HEAD WITHOUT CONTRAST TECHNIQUE: Contiguous axial images were obtained from the base of the skull through the vertex without intravenous contrast. COMPARISON:  CT head 03/02/2012. FINDINGS: Brain: There is no evidence of acute intracranial hemorrhage, mass lesion, brain edema or extra-axial fluid collection. There is progressive atrophy with prominence of the ventricles and subarachnoid spaces. There is mild patchy low-density in the periventricular white matter, likely due to chronic small vessel ischemic changes. There is no CT evidence of acute cortical infarction. Vascular: Diffuse intracranial vascular calcifications. No hyperdense vessel identified. Skull: Negative for fracture or focal lesion. Sinuses/Orbits: The visualized paranasal sinuses and mastoid air cells are clear. No orbital abnormalities are seen. Other: Mucosal thickening in the nasal passages and fluid opacification of the nasopharynx attributed to endotracheal intubation. IMPRESSION: 1. No acute intracranial findings. 2. Progressive cerebral atrophy from 2013. Electronically Signed   By: Richardean Sale M.D.   On: 10/02/2018 12:11   Ct Chest Wo Contrast  Result Date: 10/02/2018 CLINICAL DATA:  Patient was found down.  Right chest trauma. EXAM: CT CHEST WITHOUT CONTRAST TECHNIQUE: Multidetector CT imaging of the chest was performed following the standard protocol without IV contrast. COMPARISON:  Chest x-ray dated 10/02/2018 FINDINGS: Cardiovascular: Aortic atherosclerosis. Heart size is normal. No pericardial effusion. Mediastinum/Nodes: Endotracheal tube tip is just above the carina and could be retracted 2-3 cm. NG tube tip is in the fundus of the stomach and  could be advanced no adenopathy. Thyroid gland and trachea and esophagus appear normal. Lungs/Pleura: No infiltrates or effusions. Minimal atelectasis at the right lung base posteriorly. Upper Abdomen: No acute abnormality. Musculoskeletal: No acute abnormality. IMPRESSION: 1. No acute abnormality of the chest. 2. Endotracheal tube is at the level of the carina and could be retracted approximately 2-3 cm. 3. NG tube tip is in the fundus of the stomach and could be slightly advanced. 4. Critical Value/emergent results were called by telephone at the time of interpretation on 10/02/2018 at 12:27 pm to Dr. Gerlene Fee , who verbally acknowledged these results. Electronically Signed   By: Lorriane Shire M.D.   On: 10/02/2018 12:27   Dg Pelvis Portable  Result Date: 10/02/2018 CLINICAL DATA:  Unwitnessed fall. EXAM: PORTABLE PELVIS 1-2 VIEWS COMPARISON:  None. FINDINGS: There is no evidence of pelvic fracture or diastasis. No pelvic bone lesions are seen. IMPRESSION: Negative. Electronically Signed   By: Marijo Conception M.D.   On: 10/02/2018 11:21   Dg Chest Port 1 View  Result Date: 10/03/2018 CLINICAL DATA:  Hypoxia EXAM: PORTABLE CHEST 1 VIEW COMPARISON:  Chest radiograph and chest CT October 02, 2018 FINDINGS: Endotracheal tube tip is 2.0 cm above the carina. Nasogastric tube tip is in the proximal stomach. The side port may be above the gastroesophageal junction. There is also an apparent second catheter/monitor with tip at the gastroesophageal junction. No pneumothorax. There is atelectatic change in the right mid lung. Lungs elsewhere are clear. Heart size and pulmonary vascular normal. No adenopathy. No bone lesions. IMPRESSION: Tube positions as described without pneumothorax. Nasogastric tube side port may be above the diaphragm; it may be prudent to advance nasogastric tube approximately 6-7 cm. There is atelectatic change in the right  base. Lungs elsewhere clear. Heart size within normal limits.  Electronically Signed   By: Lowella Grip III M.D.   On: 10/03/2018 08:28   Dg Chest Portable 1 View  Result Date: 10/02/2018 CLINICAL DATA:  Found down in front yard. Unresponsive. Endotracheal tube placement. EXAM: PORTABLE CHEST 1 VIEW COMPARISON:  None. FINDINGS: The heart size is normal. The patient is intubated. Endotracheal tube terminates 2 cm from the carina. The patient is slightly rotated to the left, exaggerating the mediastinum on the right. Biapical airspace opacities are present. Changes of COPD are noted. The visualized soft tissues and bony thorax are unremarkable. IMPRESSION: 1. Endotracheal tube terminates 2 cm from the carina. 2. Biapical interstitial prominence is likely chronic. 3. No acute cardiopulmonary disease. Electronically Signed   By: San Morelle M.D.   On: 10/02/2018 11:21   Dg Femur Port, Min 2 Views Right  Result Date: 10/02/2018 CLINICAL DATA:  Found in yard unresponsive. Trauma. EXAM: RIGHT FEMUR PORTABLE 1 VIEW COMPARISON:  None. FINDINGS: Portable AP radiographs of the right femur are provided. No acute fracture is identified. Right knee arthroplasty is incompletely evaluated. Atherosclerotic vascular calcifications are noted. IMPRESSION: No acute osseous abnormality identified. Electronically Signed   By: Logan Bores M.D.   On: 10/02/2018 11:20        Scheduled Meds:  chlorhexidine gluconate (MEDLINE KIT)  15 mL Mouth Rinse BID   Chlorhexidine Gluconate Cloth  6 each Topical Daily   feeding supplement (OSMOLITE 1.5 CAL)  1,000 mL Per Tube Q24H   feeding supplement (PRO-STAT SUGAR FREE 64)  60 mL Per Tube BID   free water  200 mL Per Tube Q8H   insulin aspart  0-15 Units Subcutaneous Q4H   mouth rinse  15 mL Mouth Rinse 10 times per day   multivitamin  15 mL Oral Daily   pantoprazole (PROTONIX) IV  40 mg Intravenous QHS   sodium chloride flush  3 mL Intravenous Once   Continuous Infusions:  sodium chloride 150 mL/hr at 10/03/18  1100   amiodarone 30 mg/hr (10/03/18 1100)   heparin 1,000 Units/hr (10/03/18 1114)   propofol (DIPRIVAN) infusion 2.5 mcg/kg/min (10/03/18 1100)     LOS: 1 day    Time spent: 30 minutes    Zaylei Mullane Darleen Crocker, DO [facility] Pager [phone]  If 7PM-7AM, please contact night-coverage [email or web address] Password [facility] 10/03/2018, 11:46 AM

## 2018-10-04 DIAGNOSIS — J9600 Acute respiratory failure, unspecified whether with hypoxia or hypercapnia: Secondary | ICD-10-CM

## 2018-10-04 DIAGNOSIS — R4182 Altered mental status, unspecified: Secondary | ICD-10-CM

## 2018-10-04 DIAGNOSIS — J9621 Acute and chronic respiratory failure with hypoxia: Secondary | ICD-10-CM

## 2018-10-04 DIAGNOSIS — N179 Acute kidney failure, unspecified: Secondary | ICD-10-CM

## 2018-10-04 DIAGNOSIS — L8990 Pressure ulcer of unspecified site, unspecified stage: Secondary | ICD-10-CM

## 2018-10-04 DIAGNOSIS — G9340 Encephalopathy, unspecified: Secondary | ICD-10-CM

## 2018-10-04 LAB — URINALYSIS, ROUTINE W REFLEX MICROSCOPIC
Bilirubin Urine: NEGATIVE
Glucose, UA: 50 mg/dL — AB
Ketones, ur: NEGATIVE mg/dL
Leukocytes,Ua: NEGATIVE
Nitrite: NEGATIVE
Protein, ur: NEGATIVE mg/dL
Specific Gravity, Urine: 1.023 (ref 1.005–1.030)
pH: 5 (ref 5.0–8.0)

## 2018-10-04 LAB — CBC
HCT: 33.8 % — ABNORMAL LOW (ref 36.0–46.0)
Hemoglobin: 11.1 g/dL — ABNORMAL LOW (ref 12.0–15.0)
MCH: 28.6 pg (ref 26.0–34.0)
MCHC: 32.8 g/dL (ref 30.0–36.0)
MCV: 87.1 fL (ref 80.0–100.0)
Platelets: 167 10*3/uL (ref 150–400)
RBC: 3.88 MIL/uL (ref 3.87–5.11)
RDW: 15.2 % (ref 11.5–15.5)
WBC: 7.8 10*3/uL (ref 4.0–10.5)
nRBC: 0 % (ref 0.0–0.2)

## 2018-10-04 LAB — COMPREHENSIVE METABOLIC PANEL
ALT: 34 U/L (ref 0–44)
AST: 44 U/L — ABNORMAL HIGH (ref 15–41)
Albumin: 2 g/dL — ABNORMAL LOW (ref 3.5–5.0)
Alkaline Phosphatase: 67 U/L (ref 38–126)
Anion gap: 5 (ref 5–15)
BUN: 60 mg/dL — ABNORMAL HIGH (ref 8–23)
CO2: 22 mmol/L (ref 22–32)
Calcium: 9 mg/dL (ref 8.9–10.3)
Chloride: 120 mmol/L — ABNORMAL HIGH (ref 98–111)
Creatinine, Ser: 1.23 mg/dL — ABNORMAL HIGH (ref 0.44–1.00)
GFR calc Af Amer: 53 mL/min — ABNORMAL LOW (ref >60)
GFR calc non Af Amer: 45 mL/min — ABNORMAL LOW (ref >60)
Glucose, Bld: 148 mg/dL — ABNORMAL HIGH (ref 70–99)
Potassium: 3.3 mmol/L — ABNORMAL LOW (ref 3.5–5.1)
Sodium: 147 mmol/L — ABNORMAL HIGH (ref 135–145)
Total Bilirubin: 0.7 mg/dL (ref 0.3–1.2)
Total Protein: 4.4 g/dL — ABNORMAL LOW (ref 6.5–8.1)

## 2018-10-04 LAB — LACTIC ACID, PLASMA: Lactic Acid, Venous: 1.2 mmol/L (ref 0.5–1.9)

## 2018-10-04 LAB — GLUCOSE, CAPILLARY
Glucose-Capillary: 110 mg/dL — ABNORMAL HIGH (ref 70–99)
Glucose-Capillary: 123 mg/dL — ABNORMAL HIGH (ref 70–99)
Glucose-Capillary: 137 mg/dL — ABNORMAL HIGH (ref 70–99)
Glucose-Capillary: 155 mg/dL — ABNORMAL HIGH (ref 70–99)
Glucose-Capillary: 96 mg/dL (ref 70–99)
Glucose-Capillary: 98 mg/dL (ref 70–99)

## 2018-10-04 LAB — MAGNESIUM
Magnesium: 2.2 mg/dL (ref 1.7–2.4)
Magnesium: 2.1 mg/dL (ref 1.7–2.4)

## 2018-10-04 LAB — HEPARIN LEVEL (UNFRACTIONATED)
Heparin Unfractionated: 0.18 IU/mL — ABNORMAL LOW (ref 0.30–0.70)
Heparin Unfractionated: 0.17 IU/mL — ABNORMAL LOW (ref 0.30–0.70)

## 2018-10-04 LAB — TRIGLYCERIDES: Triglycerides: 56 mg/dL (ref <150)

## 2018-10-04 LAB — CK: Total CK: 692 U/L — ABNORMAL HIGH (ref 38–234)

## 2018-10-04 LAB — PHOSPHORUS
Phosphorus: 2.4 mg/dL — ABNORMAL LOW (ref 2.5–4.6)
Phosphorus: 2.3 mg/dL — ABNORMAL LOW (ref 2.5–4.6)

## 2018-10-04 MED ORDER — PIPERACILLIN-TAZOBACTAM 3.375 G IVPB
3.3750 g | Freq: Three times a day (TID) | INTRAVENOUS | Status: DC
  Administered 2018-10-04 – 2018-10-06 (×8): 3.375 g via INTRAVENOUS
  Filled 2018-10-04 (×10): qty 50

## 2018-10-04 MED ORDER — VANCOMYCIN HCL 10 G IV SOLR
1250.0000 mg | Freq: Once | INTRAVENOUS | Status: AC
  Administered 2018-10-04: 1250 mg via INTRAVENOUS
  Filled 2018-10-04: qty 1250

## 2018-10-04 MED ORDER — ADULT MULTIVITAMIN W/MINERALS CH
1.0000 | ORAL_TABLET | Freq: Every day | ORAL | Status: DC
  Filled 2018-10-04: qty 1

## 2018-10-04 MED ORDER — POTASSIUM CHLORIDE 20 MEQ/15ML (10%) PO SOLN
40.0000 meq | Freq: Once | ORAL | Status: AC
  Administered 2018-10-04: 07:00:00 40 meq
  Filled 2018-10-04: qty 30

## 2018-10-04 MED ORDER — ENSURE ENLIVE PO LIQD: 237.0000 mL | Freq: Two times a day (BID) | ORAL | Status: DC

## 2018-10-04 MED ORDER — METOPROLOL TARTRATE 5 MG/5ML IV SOLN
5.0000 mg | Freq: Once | INTRAVENOUS | Status: AC
  Administered 2018-10-04: 5 mg via INTRAVENOUS
  Filled 2018-10-04: qty 5

## 2018-10-04 MED ORDER — PIPERACILLIN-TAZOBACTAM 3.375 G IVPB 30 MIN
3.3750 g | Freq: Once | INTRAVENOUS | Status: AC
  Administered 2018-10-04: 3.375 g via INTRAVENOUS
  Filled 2018-10-04: qty 50

## 2018-10-04 MED ORDER — VANCOMYCIN HCL 10 G IV SOLR: 1250.0000 mg | INTRAVENOUS | Status: DC

## 2018-10-04 MED ORDER — METOPROLOL TARTRATE 12.5 MG HALF TABLET
12.5000 mg | ORAL_TABLET | Freq: Two times a day (BID) | ORAL | Status: DC
  Filled 2018-10-04: qty 1

## 2018-10-04 NOTE — Progress Notes (Signed)
NAME:  Jordan Reed, MRN:  [id number], DOB:  12/05/1950, LOS: 2 ADMISSION DATE:  10/02/2018, CONSULTATION DATE: 10/02/2018 REFERRING MD: Dr. Pilar PlateBero, CHIEF COMPLAINT: Altered mental status  Brief History   68 year old female found down with altered mental status admitted 4/15 with acute renal failure from dehydration/rhabdomyolysis.  Intubated in the ED for airway protection.  Past Medical History   has no past medical history on file.  Significant Hospital Events   4/15 admit 4/16: no longer in AF w/ RVR, back in NSR. Na slowly improving, IVFs changed to `1/2 NS; scr improved 4/17: more awake. Interactive. Intermittently follows commands hemodynamically stable overnight. Cr improved. Na improved. Ready for extubation spiked fever early in am. Cultures sent abx started.  Consults:    Procedures:  ETT 4/15 >  Significant Diagnostic Tests:  CT abdomen 4/15 > No acute abnormality. Dilatation of the common bile duct 15 mm, above normal limits for a post cholecystectomy patient. Is the patient's bilirubin elevated CT chest 4/15 > no acute abnormality CT head 4/15 > no acute findings.  Hip X-rays on admit > neg ECHO 4/16: hyperdynamic systolic fxn, EF > 65%  Micro Data:  Urine Cx 4/15 > Sputum 4/16 abundant GNR>>> Antimicrobials:    Interim history/subjective:   More awake.  Objective   Blood pressure 137/69, pulse (Abnormal) 110, temperature 99.9 F (37.7 C), resp. rate (Abnormal) 26, height 5\' 5"  (1.651 m), weight 74.3 kg, SpO2 99 %.    Vent Mode: CPAP;PSV FiO2 (%):  [30 %-40 %] 30 % Set Rate:  [18 bmp] 18 bmp Vt Set:  [450 mL] 450 mL PEEP:  [5 cmH20] 5 cmH20 Pressure Support:  [8 cmH20] 8 cmH20 Plateau Pressure:  [14 cmH20-15 cmH20] 14 cmH20   Intake/Output Summary (Last 24 hours) at 10/04/2018 0923 Last data filed at 10/04/2018 0900 Gross per 24 hour  Intake 5461.32 ml  Output 1040 ml  Net 4421.32 ml   Filed Weights   10/02/18 1116 10/03/18 0400 10/04/18 0500   Weight: 77.1 kg 67.2 kg 74.3 kg    Examination:  General more awake. No distress. Appears comfortable on PSV HENT NCAT orally intubated.  Pulm clear w/ F/Vt less than 100 on PSV of 5 cmH2O, no accessory use  Card RRR w/out sig MRG abd not tender + bowel sounds  GU clear yellow  Neuro more awake. F/C intermittently seems to be more interactive Ext brisk CR warm strong pulses.   Resolved Hospital Problem list   SVT/Afib w/ RVR  Assessment & Plan:   Acute kidney injury: secondary to hypovolemia and rhabdomyolysis.  -scr improving; total CKs improved as welll Plan Cont IVFs; can decrease IVF rate as almost 8 liters + Renal dose meds Strict I&O Avoid hypotension   Fluid and electrolyte imbalance 2/2 dehydration w/ resultant Severe hypernatremia, also has mixed anion gap and NAG metabolic acidosis; suspect somewhat exacerbated ny NaCl resuscitation.  -Na has improved over night as has Cl. Remains hypokalemic; LA cleared Plan Cont IV fluids (can decrease rate to 100) Replace K Cont current free water replacement   Acute metabolic encephalopathy: likely in the setting of uremia and hypernatremia; sounds like has h/o dementia per d/w grand-daughter.  Plan Hold sedating meds Cont to rx metabolic derangements   Acute hypoxemic respiratory failure w/ RLL ATX ? Aspiration  Plan SBT Hope for extubation later today  Am cxr  AF w/ RVR; now NSR Plan Cont heparin for now but not sure she is a candidate for long  term AC due to dementia   Mild leukocytosis w/ fever spike  Plan Trend fever and WBC curve  F/u sputum and urine culture  Day # 1 vanc and zosyn   Best practice:  Diet: NPO - advance OG Pain/Anxiety/Delirium protocol (if indicated): Propofol for RASS goal 0 to -2 VAP protocol (if indicated): Per protocol DVT prophylaxis: heparin infusion GI prophylaxis: PPI Glucose control: SSI Mobility: BR Code Status: FULL Family Communication: no answer at only number in  chart. Social work consult asked. Pharmacists attempting to reach out to various pharmacies to determine home meds.  Disposition: remains critically ill. Mental status much better. Wonder about baseline dementia. As to I am not sure why she was initially altered and ended up in the state she presented in. She may need SNF after dc    Simonne Martinet ACNP-BC [facility] Pulmonary/Critical Care Pager # [phone] OR # [phone] if no answer

## 2018-10-04 NOTE — Progress Notes (Signed)
Pharmacy Antibiotic Note  Jordan Reed is a 68 y.o. female admitted on 10/02/2018 with fever.  Pharmacy has been consulted for vancomycin and zosyn dosing.  Plan: Vancomycin 1250 mg IV q48 hours Zosyn 3.375 gm IV q8 hours F/u renal function, cultures and clinical course  Height: 5\' 5"  (165.1 cm) Weight: 148 lb 2.4 oz (67.2 kg) IBW/kg (Calculated) : 57  Temp (24hrs), Avg:99.8 F (37.7 C), Min:99 F (37.2 C), Max:100.8 F (38.2 C)  Recent Labs  Lab 10/02/18 1100 10/02/18 1833 10/02/18 2325 10/03/18 0437 10/03/18 1125  WBC 11.3*  --   --  12.1*  --   CREATININE 3.43* 2.65* 2.44* 2.10*  --   LATICACIDVEN 4.5*  --   --   --  1.2    Estimated Creatinine Clearance: 23.4 mL/min (A) (by C-G formula based on SCr of 2.1 mg/dL (H)).    Allergies  Allergen Reactions  . Codeine Hives and Itching    Thank you for allowing pharmacy to be a part of this patient's care.  Woodfin Ganja 10/04/2018 3:38 AM

## 2018-10-04 NOTE — Progress Notes (Signed)
68 year old admitted 4/15 after being found down with altered mental status and AKI with mild rhabdomyolysis intubated for airway protection CT head/chest abdomen and pelvis did not show any abnormality Echo showed normal LV function. Cultures were negative except for gram-negative rods in sputum  She appears quite weak and deconditioned, elderly woman, able to tell me her name , and follows one-step commands ,really does not move her extremities much, sore S2 tacky, sinus on monitor with frequent APCs, narrow complex, decreased breath sounds bilateral, no rhonchi, soft and nontender abdomen, 2+ edema.   Labs reviewed which show improving renal function, edema decreased to 147, mild hypokalemia and hypophosphatemia  Chest x-ray 4/16 personally reviewed which shows mild atelectasis of right base, otherwise clear lungs.  Impression/plan Acute respiratory failure-she was intubated for airway protection, she was weaned on pressure support and extubated and seems to be doing well  AKI -improving, continue half-normal saline at 100/hour Replete hypokalemia and hypophosphatemia Hypernatremia has improved  Fever-UA showed white cells but urine culture never sent, has received 2 days of Zosyn, can continue, follow gram-negative rods in sputum, can discontinue vancomycin  Sinus tachycardia/very low TSH-could mean that Synthroid dose is too high for her, hold for now.  Acute encephalopathy-appears metabolic, need better clarification of her baseline, will need aggressive PT  Care can transition from PCCM to triad, DNR status noted.  The patient is critically ill with multiple organ systems failure and requires high complexity decision making for assessment and support, frequent evaluation and titration of therapies, application of advanced monitoring technologies and extensive interpretation of multiple databases. Critical Care Time devoted to patient care services described in this note independent  of APP/resident  time is 32 minutes.   Comer Locket Vassie Loll MD

## 2018-10-04 NOTE — Progress Notes (Signed)
ANTICOAGULATION CONSULT NOTE - Follow Up Consult  Pharmacy Consult for heparin Indication: atrial fibrillation  Labs: Recent Labs    10/02/18 1100  10/02/18 1833 10/02/18 1839 10/02/18 2325 10/03/18 0437 10/04/18 0327  HGB 14.9   < >  --  11.9*  --  12.9 11.1*  HCT 47.5*   < >  --  35.0*  --  38.4 33.8*  PLT 286  --   --   --   --  213 167  APTT  --   --   --   --  85*  --   --   LABPROT 15.4*  --   --   --   --   --   --   INR 1.2  --   --   --   --   --   --   HEPARINUNFRC  --   --   --   --  0.38 0.49 0.18*  CREATININE 3.43*  --  2.65*  --  2.44* 2.10*  --   CKTOTAL 2,642*  --   --   --   --  1,594*  --   CKMB 42.8*  --   --   --   --   --   --   TROPONINI 0.08*  --   --   --   --   --   --    < > = values in this interval not displayed.    Assessment: 68yo female subtherapeutic on heparin after two levels at goal; no gtt issues or signs of bleeding per RN.  Goal of Therapy:  Heparin level 0.3-0.7 units/ml   Plan:  Will increase heparin gtt by 2-3 units/kg/hr to 1200 units/hr and check level in 8 hours.    Vernard Gambles, PharmD, BCPS  10/04/2018,4:53 AM

## 2018-10-04 NOTE — Progress Notes (Signed)
ANTICOAGULATION CONSULT NOTE   Pharmacy Consult for heparin Indication: atrial fibrillation  Allergies  Allergen Reactions  . Codeine Hives and Itching    Patient Measurements: Height: 5\' 5"  (165.1 cm) Weight: 163 lb 12.8 oz (74.3 kg) IBW/kg (Calculated) : 57 Heparin Dosing Weight: 73kg  Vital Signs: Temp: 99.9 F (37.7 C) (04/17 1400) Temp Source: Bladder (04/17 1100) BP: 150/80 (04/17 1400) Pulse Rate: 56 (04/17 1400)  Labs: Recent Labs    10/02/18 1100  10/02/18 1839  10/02/18 2325 10/03/18 0437 10/04/18 0327 10/04/18 1502  HGB 14.9   < > 11.9*  --   --  12.9 11.1*  --   HCT 47.5*   < > 35.0*  --   --  38.4 33.8*  --   PLT 286  --   --   --   --  213 167  --   APTT  --   --   --   --  85*  --   --   --   LABPROT 15.4*  --   --   --   --   --   --   --   INR 1.2  --   --   --   --   --   --   --   HEPARINUNFRC  --   --   --    < > 0.38 0.49 0.18* 0.17*  CREATININE 3.43*   < >  --   --  2.44* 2.10* 1.23*  --   CKTOTAL 2,642*  --   --   --   --  1,594* 692*  --   CKMB 42.8*  --   --   --   --   --   --   --   TROPONINI 0.08*  --   --   --   --   --   --   --    < > = values in this interval not displayed.    Estimated Creatinine Clearance: 44.8 mL/min (A) (by C-G formula based on SCr of 1.23 mg/dL (H)).   Medical History: History reviewed. No pertinent past medical history.  Medications:  Infusions:  . sodium chloride 100 mL/hr at 10/04/18 1127  . heparin 1,200 Units/hr (10/04/18 1234)  . piperacillin-tazobactam (ZOSYN)  IV 3.375 g (10/04/18 1122)  . propofol (DIPRIVAN) infusion Stopped (10/04/18 8250)    Assessment: 82 yof presented to the ED with tachycardia and altered mental status. To start IV heparin for afib. Baseline CBC is WNL. INR is 1.2 and troponin is elevated. It is unknown if she is on anticoagulation PTA.   Heparin level came back subtherapeutic at 0.17, on 1200 units/hr. Hgb stable at 11.1, plt 167. No s/sx of bleeding. No infusion  issues.   Goal of Therapy:  Heparin level 0.3-0.7 units/ml Monitor platelets by anticoagulation protocol: Yes   Plan:  Increase heparin infusion to 1400 units/hr Daily heparin level and CBC  Sherron Monday, PharmD, BCCCP Clinical Pharmacist  Pager: [phone] Phone: [phone] 10/04/2018,3:54 PM

## 2018-10-04 NOTE — Progress Notes (Signed)
eLink Physician-Brief Progress Note Patient Name: KAZIA MORINI DOB: 08-Oct-1950 MRN: [id number]   Date of Service  10/04/2018  HPI/Events of Note  K+ = 3.3 and Creatinine = 1.23.   eICU Interventions  Will replace K+.      Intervention Category Major Interventions: Electrolyte abnormality - evaluation and management  Sommer,Steven Eugene 10/04/2018, 6:24 AM

## 2018-10-04 NOTE — Progress Notes (Signed)
eLink Physician-Brief Progress Note Patient Name: Jordan Reed DOB: 17-Apr-1951 MRN: [id number]   Date of Service  10/04/2018  HPI/Events of Note  Fever to 101.0 F - No antibiotic Rx. AST elevated and Creatinine elevated. Therefore, can't use Tylenol or Motrin.   eICU Interventions  Will order: 1. Blood cultures X 2. 2. Tracheal aspirate culture. 3. UA with reflex microscopic.  4. Vancomycin and Zosyn per pharmacy consult.  5. Ice packs PRN.     Intervention Category Major Interventions: Other:;Infection - evaluation and management  Avantika Shere Dennard Nip 10/04/2018, 3:27 AM

## 2018-10-04 NOTE — Clinical Social Work Note (Signed)
CSW acknowledges consult, "(daughers vs significant other POA)-no POA paperwork in system." Discussed with RN. Daughters, as next of kin, should be her primary decision makers unless significant other has HCPOA paperwork or they both defer decision making to him. Discussed SNF placement with RN as well. Patient needs PT and OT orders so she can be evaluated.  Charlynn Court, CSW [phone]

## 2018-10-04 NOTE — Progress Notes (Addendum)
PROGRESS NOTE    Jordan Reed  CBS:[id number] DOB: [birth year] DOA: 10/02/2018 PCP: Patient, No Pcp Per   Brief Narrative:  Per HPI: 68 year old female with no known past medical history. She has no medical records with [place] and I have queried [place], and [facility] with no files found. She apparently lives at home but has very active involvement of a caregiver who was unfortunately hospitalized 4 days ago. Since that time the patient has been by herself. It is unclear what level of assistance she requires at baseline. A car driving past her house noticed her to be slumped over on the front porch on 10/02/2018. EMS was alerted and upon their arrival she had altered mental status. They hooked her up to EKG and found her to be profoundly tachycardic with a narrow complex rhythm. They felt as though her symptoms may be secondary to symptomatic SVT. She was cardioverted twice without effect. In the emergency department she remained tachycardic. She was somewhat cooperative was staff and able to give a limited history, however, she became increasingly agitated and required intubation for airway protection. Laboratory evaluation consistent with dehydration rhabdomyolysis and therefore IV fluid resuscitation was given. She received 3 L of crystalloid and then was started on infusion at 125 an hour.   Patient admitted for hypoxemic respiratory failure as well as metabolic encephalopathy related to AKI with rhabdomyolysis.  She is also noted to have atrial fibrillation with RVR for which she was started on heparin drip as well as amiodarone.   Assessment & Plan:   Active Problems:   Rhabdomyolysis   Altered mental status   1. AKI-prerenal and also associated with rhabdomyolysis-improving.  Continue aggressive fluid resuscitation with half-normal saline on account of hypernatremia.  Free water to be added via NG tube per PCCM as well.  Trend CK in a.m; which is downtrending.  Avoid  nephrotoxic agents.  Check LFTs in a.m. 2. Atrial fibrillation with RVR-converted to SR.  Unknown if this is new.  Amiodarone DC on 4/16 and patient has remained on SR. Echo on 4/16 with LVEF>65% and hyperdynamic. 3. Metabolic encephalopathy likely secondary to above-improved.  Continue supportive care for now and may have some component of this due to Synthroid toxicity. 4. Hypoxemic respiratory failure status post intubation-resolved with plans to extubate today per PCCM. Likely transfer to progressive shortly after. 5. History of diabetes.  Hyperglycemia currently well controlled. 6. History of hypertension.  Maintain on aggressive IV fluid hydration and monitor closely. Will add labetalol prn bp elevations.   DVT prophylaxis:Heparin drip Code Status: DNR Family Communication: None available Disposition Plan: Continue IV fluid hydration and rate control.  She appears to be profoundly dehydrated and has an element of rhabdomyolysis that is improving. Fever overnight for which Vanc and Zosyn ordered, but will DC Vanc since MRSA nares negative. Potential Synthroid overdose and will likely require SNF on DC. Will transfer to progressive later today.   Consultants:   PCCM  Procedures:   None  Antimicrobials:   Vancomycin 4/17-4/17  Zosyn 4/17->  Subjective: Patient seen and evaluated today with fever noted overnight for which patient was started on Vanc and Zosyn. More awake and responsive on ventilator this am.  Objective: Vitals:   10/04/18 0807 10/04/18 0900 10/04/18 1000 10/04/18 1029  BP: 122/66 137/69 (!) 149/70   Pulse: (!) 104 (!) 110 (!) 111   Resp: 20 (!) 26 (!) 32   Temp:  99.9 F (37.7 C) 100 F (37.8 C)  TempSrc:      SpO2: 100% 99% 99% 99%  Weight:      Height:        Intake/Output Summary (Last 24 hours) at 10/04/2018 1038 Last data filed at 10/04/2018 1000 Gross per 24 hour  Intake 5514.61 ml  Output 1030 ml  Net 4484.61 ml   Filed Weights    10/02/18 1116 10/03/18 0400 10/04/18 0500  Weight: 77.1 kg 67.2 kg 74.3 kg    Examination:  General exam: Appears calm and comfortable  Respiratory system: Clear to auscultation. Respiratory effort normal. Intubated on FiO2 40%. Cardiovascular system: S1 & S2 heard, RRR. No JVD, murmurs, rubs, gallops or clicks. No pedal edema. Sinus tachycardia. Gastrointestinal system: Abdomen is nondistended, soft and nontender. No organomegaly or masses felt. Normal bowel sounds heard. Central nervous system: Alert and arousable. Extremities: No edema. Skin: No rashes, lesions or ulcers Psychiatry: Cannot be evaluated.    Data Reviewed: I have personally reviewed following labs and imaging studies  CBC: Recent Labs  Lab 10/02/18 1100 10/02/18 1122 10/02/18 1234 10/02/18 1839 10/03/18 0437 10/04/18 0327  WBC 11.3*  --   --   --  12.1* 7.8  NEUTROABS 9.7*  --   --   --   --   --   HGB 14.9 13.6 11.9* 11.9* 12.9 11.1*  HCT 47.5* 40.0 35.0* 35.0* 38.4 33.8*  MCV 89.0  --   --   --  86.9 87.1  PLT 286  --   --   --  213 329   Basic Metabolic Panel: Recent Labs  Lab 10/02/18 1100  10/02/18 1833 10/02/18 1839 10/02/18 2325 10/03/18 0437 10/03/18 1032 10/03/18 1625 10/04/18 0327  NA 153*   < > 153* 153* 152* 152*  --   --  147*  K 3.7   < > 3.1* 2.9* 3.5 3.6  --   --  3.3*  CL 118*  --  123*  --  124* 123*  --   --  120*  CO2 13*  --  16*  --  16* 15*  --   --  22  GLUCOSE 168*  --  167*  --  137* 164*  --   --  148*  BUN 70*  --  70*  --  71* 71*  --   --  60*  CREATININE 3.43*  --  2.65*  --  2.44* 2.10*  --   --  1.23*  CALCIUM 9.1  --  8.6*  --  8.7* 8.8*  --   --  9.0  MG  --   --   --   --  2.1 2.2 2.3 2.2 2.2  PHOS  --   --   --   --   --  4.9* 4.3 3.6 2.4*   < > = values in this interval not displayed.   GFR: Estimated Creatinine Clearance: 44.8 mL/min (A) (by C-G formula based on SCr of 1.23 mg/dL (H)). Liver Function Tests: Recent Labs  Lab 10/02/18 1100  10/03/18 1625 10/04/18 0327  AST 74* 49* 44*  ALT 43 37 34  ALKPHOS 95 71 67  BILITOT 2.4* 0.4 0.7  PROT 6.4* 4.7* 4.4*  ALBUMIN 3.4* 2.2* 2.0*   No results for input(s): LIPASE, AMYLASE in the last 168 hours. No results for input(s): AMMONIA in the last 168 hours. Coagulation Profile: Recent Labs  Lab 10/02/18 1100  INR 1.2   Cardiac Enzymes: Recent Labs  Lab 10/02/18 1100 10/03/18 0437  10/04/18 0327  CKTOTAL 2,642* 1,594* 692*  CKMB 42.8*  --   --   TROPONINI 0.08*  --   --    BNP (last 3 results) No results for input(s): PROBNP in the last 8760 hours. HbA1C: No results for input(s): HGBA1C in the last 72 hours. CBG: Recent Labs  Lab 10/03/18 1623 10/03/18 2007 10/03/18 2351 10/04/18 0412 10/04/18 0753  GLUCAP 165* 126* 155* 123* 137*   Lipid Profile: Recent Labs    10/03/18 0437 10/04/18 0327  TRIG 75 56   Thyroid Function Tests: Recent Labs    10/03/18 0437 10/03/18 1032  TSH <0.010*  --   FREET4  --  4.07*   Anemia Panel: No results for input(s): VITAMINB12, FOLATE, FERRITIN, TIBC, IRON, RETICCTPCT in the last 72 hours. Sepsis Labs: Recent Labs  Lab 10/02/18 1100 10/03/18 1125 10/04/18 0327  LATICACIDVEN 4.5* 1.2 1.2    Recent Results (from the past 240 hour(s))  Culture, blood (Routine x 2)     Status: None (Preliminary result)   Collection Time: 10/02/18 11:00 AM  Result Value Ref Range Status   Specimen Description BLOOD LEFT ARM  Final   Special Requests   Final    BOTTLES DRAWN AEROBIC AND ANAEROBIC Blood Culture results may not be optimal due to an inadequate volume of blood received in culture bottles   Culture   Final    NO GROWTH 2 DAYS Performed at [facility] [address] [place], [place] [address]    Report Status PENDING  Incomplete  Culture, blood (Routine x 2)     Status: None (Preliminary result)   Collection Time: 10/02/18 11:03 AM  Result Value Ref Range Status   Specimen Description BLOOD LEFT HAND   Final   Special Requests   Final    BOTTLES DRAWN AEROBIC AND ANAEROBIC Blood Culture results may not be optimal due to an inadequate volume of blood received in culture bottles   Culture   Final    NO GROWTH 2 DAYS Performed at [facility], [place] [address] [place], [place] [address]    Report Status PENDING  Incomplete  MRSA PCR Screening     Status: None   Collection Time: 10/02/18 11:49 PM  Result Value Ref Range Status   MRSA by PCR NEGATIVE NEGATIVE Final    Comment:        The GeneXpert MRSA Assay (FDA approved for NASAL specimens only), is one component of a comprehensive MRSA colonization surveillance program. It is not intended to diagnose MRSA infection nor to guide or monitor treatment for MRSA infections. Performed at [facility], [place] [address] [place], [place] [address]   Culture, respiratory (non-expectorated)     Status: None (Preliminary result)   Collection Time: 10/04/18  3:25 AM  Result Value Ref Range Status   Specimen Description TRACHEAL ASPIRATE  Final   Special Requests NONE  Final   Gram Stain   Final    ABUNDANT WBC PRESENT, PREDOMINANTLY PMN FEW GRAM NEGATIVE RODS Performed at [facility], [place] [address] [place], [place] [address]    Culture PENDING  Incomplete   Report Status PENDING  Incomplete  Culture, blood (Routine X 2) w Reflex to ID Panel     Status: None (Preliminary result)   Collection Time: 10/04/18  3:48 AM  Result Value Ref Range Status   Specimen Description BLOOD LEFT ANTECUBITAL  Final   Special Requests   Final  BOTTLES DRAWN AEROBIC ONLY Blood Culture results may not be optimal due to an inadequate volume of blood received in culture bottles   Culture   Final    NO GROWTH < 12 HOURS Performed at [facility] [address], [place], [place] [address]    Report Status PENDING  Incomplete  Culture, blood (Routine X 2) w Reflex to ID Panel     Status: None (Preliminary result)   Collection  Time: 10/04/18  3:48 AM  Result Value Ref Range Status   Specimen Description BLOOD LEFT HAND  Final   Special Requests   Final    BOTTLES DRAWN AEROBIC ONLY Blood Culture results may not be optimal due to an inadequate volume of blood received in culture bottles   Culture   Final    NO GROWTH < 12 HOURS Performed at [facility], [place] [address] [place],  [address]    Report Status PENDING  Incomplete         Radiology Studies: Ct Abdomen Pelvis Wo Contrast  Result Date: 10/02/2018 CLINICAL DATA:  Trauma to the chest and flank. The patient was found down. EXAM: CT ABDOMEN AND PELVIS WITHOUT CONTRAST TECHNIQUE: Multidetector CT imaging of the abdomen and pelvis was performed following the standard protocol without IV contrast. COMPARISON:  None. FINDINGS: Lower chest: Minimal atelectasis at the posterior aspect of the right lung base. Hepatobiliary: Attic parenchyma is normal. Full a cystectomy. The common bile duct is dilated to a diameter of 15 mm, above normal limits for a post cholecystectomy patient. However, there is no visible mass or stone in the distal duct. Is the patient's bilirubin elevated? Pancreas: Unremarkable. No pancreatic ductal dilatation or surrounding inflammatory changes. Spleen: No splenic injury or perisplenic hematoma. Adrenals/Urinary Tract: Adrenal glands are normal. Slight prominence of the right pelvis. Kidneys are otherwise normal. No hydronephrosis. Bladder is normal. Stomach/Bowel: Multiple surgical clips in the left upper quadrant adjacent to the fundus of the stomach. NG tube tip is in the fundus of the stomach. Bowel otherwise appears normal including the terminal ileum and appendix. Vascular/Lymphatic: Aortic atherosclerosis. No enlarged abdominal or pelvic lymph nodes. Reproductive: Uterus and bilateral adnexa are unremarkable. Other: Tiny periumbilical hernia containing only fat. The defect in the anterior abdominal wall is only 13 mm. No  ascites. Musculoskeletal: No acute abnormality. 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 Chronic diffuse degenerative disc disease from L1-2 through L5-S1. IMPRESSION: 1. No acute abnormality. 2. Dilatation of the common bile duct 15 mm, above normal limits for a post cholecystectomy patient. Is the patient's bilirubin elevated? 3.  Aortic Atherosclerosis (ICD10-I70.0). Electronically Signed   By: Lorriane Shire M.D.   On: 10/02/2018 12:35   Ct Head Wo Contrast  Result Date: 10/02/2018 CLINICAL DATA:  Found down outside house. EXAM: CT HEAD WITHOUT CONTRAST TECHNIQUE: Contiguous axial images were obtained from the base of the skull through the vertex without intravenous contrast. COMPARISON:  CT head 03/02/2012. FINDINGS: Brain: There is no evidence of acute intracranial hemorrhage, mass lesion, brain edema or extra-axial fluid collection. There is progressive atrophy with prominence of the ventricles and subarachnoid spaces. There is mild patchy low-density in the periventricular white matter, likely due to chronic small vessel ischemic changes. There is no CT evidence of acute cortical infarction. Vascular: Diffuse intracranial vascular calcifications. No hyperdense vessel identified. Skull: Negative for fracture or focal lesion. Sinuses/Orbits: The visualized paranasal sinuses and mastoid air cells are clear.  No orbital abnormalities are seen. Other: Mucosal thickening in the nasal passages and fluid opacification of the nasopharynx attributed to endotracheal intubation. IMPRESSION: 1. No acute intracranial findings. 2. Progressive cerebral atrophy from 2013. Electronically Signed   By: Richardean Sale M.D.   On: 10/02/2018 12:11   Ct Chest Wo Contrast  Result Date: 10/02/2018 CLINICAL DATA:  Patient was found down.  Right chest trauma. EXAM: CT CHEST WITHOUT CONTRAST TECHNIQUE: Multidetector CT imaging of the chest was performed following the standard protocol  without IV contrast. COMPARISON:  Chest x-ray dated 10/02/2018 FINDINGS: Cardiovascular: Aortic atherosclerosis. Heart size is normal. No pericardial effusion. Mediastinum/Nodes: Endotracheal tube tip is just above the carina and could be retracted 2-3 cm. NG tube tip is in the fundus of the stomach and could be advanced no adenopathy. Thyroid gland and trachea and esophagus appear normal. Lungs/Pleura: No infiltrates or effusions. Minimal atelectasis at the right lung base posteriorly. Upper Abdomen: No acute abnormality. Musculoskeletal: No acute abnormality. IMPRESSION: 1. No acute abnormality of the chest. 2. Endotracheal tube is at the level of the carina and could be retracted approximately 2-3 cm. 3. NG tube tip is in the fundus of the stomach and could be slightly advanced. 4. Critical Value/emergent results were called by telephone at the time of interpretation on 10/02/2018 at 12:27 pm to Dr. Gerlene Fee , who verbally acknowledged these results. Electronically Signed   By: Lorriane Shire M.D.   On: 10/02/2018 12:27   Dg Pelvis Portable  Result Date: 10/02/2018 CLINICAL DATA:  Unwitnessed fall. EXAM: PORTABLE PELVIS 1-2 VIEWS COMPARISON:  None. FINDINGS: There is no evidence of pelvic fracture or diastasis. No pelvic bone lesions are seen. IMPRESSION: Negative. Electronically Signed   By: Marijo Conception M.D.   On: 10/02/2018 11:21   Dg Chest Port 1 View  Result Date: 10/03/2018 CLINICAL DATA:  Hypoxia EXAM: PORTABLE CHEST 1 VIEW COMPARISON:  Chest radiograph and chest CT October 02, 2018 FINDINGS: Endotracheal tube tip is 2.0 cm above the carina. Nasogastric tube tip is in the proximal stomach. The side port may be above the gastroesophageal junction. There is also an apparent second catheter/monitor with tip at the gastroesophageal junction. No pneumothorax. There is atelectatic change in the right mid lung. Lungs elsewhere are clear. Heart size and pulmonary vascular normal. No adenopathy. No bone  lesions. IMPRESSION: Tube positions as described without pneumothorax. Nasogastric tube side port may be above the diaphragm; it may be prudent to advance nasogastric tube approximately 6-7 cm. There is atelectatic change in the right base. Lungs elsewhere clear. Heart size within normal limits. Electronically Signed   By: Lowella Grip III M.D.   On: 10/03/2018 08:28   Dg Chest Portable 1 View  Result Date: 10/02/2018 CLINICAL DATA:  Found down in front yard. Unresponsive. Endotracheal tube placement. EXAM: PORTABLE CHEST 1 VIEW COMPARISON:  None. FINDINGS: The heart size is normal. The patient is intubated. Endotracheal tube terminates 2 cm from the carina. The patient is slightly rotated to the left, exaggerating the mediastinum on the right. Biapical airspace opacities are present. Changes of COPD are noted. The visualized soft tissues and bony thorax are unremarkable. IMPRESSION: 1. Endotracheal tube terminates 2 cm from the carina. 2. Biapical interstitial prominence is likely chronic. 3. No acute cardiopulmonary disease. Electronically Signed   By: San Morelle M.D.   On: 10/02/2018 11:21   Dg Abd Portable 1v  Result Date: 10/03/2018 CLINICAL DATA:  68 year old female with a history of gastric  tube placement EXAM: PORTABLE ABDOMEN - 1 VIEW COMPARISON:  None. FINDINGS: Gastric tube terminates near the pylorus or potentially post pyloric, in the right abdomen. Surgical changes of the epigastric region/left upper quadrant, as well as cholecystectomy. Gas within small bowel and colon. No abnormal distention. Pelvic thermister in place. IMPRESSION: Gastric tube terminates within the stomach, at the pyloric region or potentially post pyloric. Electronically Signed   By: Corrie Mckusick D.O.   On: 10/03/2018 13:15   Dg Femur Port, Min 2 Views Right  Result Date: 10/02/2018 CLINICAL DATA:  Found in yard unresponsive. Trauma. EXAM: RIGHT FEMUR PORTABLE 1 VIEW COMPARISON:  None. FINDINGS: Portable  AP radiographs of the right femur are provided. No acute fracture is identified. Right knee arthroplasty is incompletely evaluated. Atherosclerotic vascular calcifications are noted. IMPRESSION: No acute osseous abnormality identified. Electronically Signed   By: Logan Bores M.D.   On: 10/02/2018 11:20        Scheduled Meds:  chlorhexidine gluconate (MEDLINE KIT)  15 mL Mouth Rinse BID   Chlorhexidine Gluconate Cloth  6 each Topical Daily   free water  200 mL Per Tube Q8H   insulin aspart  0-15 Units Subcutaneous Q4H   mouth rinse  15 mL Mouth Rinse 10 times per day   multivitamin  15 mL Oral Daily   sodium chloride flush  3 mL Intravenous Once   Continuous Infusions:  sodium chloride 100 mL/hr at 10/04/18 1000   heparin 1,200 Units/hr (10/04/18 1000)   piperacillin-tazobactam (ZOSYN)  IV     propofol (DIPRIVAN) infusion Stopped (10/04/18 0943)     LOS: 2 days    Time spent: 30 minutes    Exavier Lina Darleen Crocker, DO [facility] Pager [phone]  If 7PM-7AM, please contact night-coverage [email or web address] Password [facility] 10/04/2018, 10:38 AM

## 2018-10-04 NOTE — Progress Notes (Signed)
Nutrition Follow-up  DOCUMENTATION CODES:   Not applicable  INTERVENTION:    Ensure Enlive po BID, each supplement provides 350 kcal and 20 grams of protein  MVI daily  NUTRITION DIAGNOSIS:   Increased nutrient needs related to wound healing as evidenced by estimated needs.  Ongoing  GOAL:   Patient will meet greater than or equal to 90% of their needs  Diet advanced   MONITOR:   Diet advancement, Weight trends, Labs, Vent status, TF tolerance, Skin, I & O's  REASON FOR ASSESSMENT:   Consult, Ventilator Enteral/tube feeding initiation and management  ASSESSMENT:   Patient with no PMH on file. Presents this admission with ARF from dehydration/rhabdomyolysis.    4/17- extubated  RD working remotely.  Unable to reach pt by phone x2. Pt's diet advanced to soft with thin liquids this am. No meal completions charted. RD to provide Ensure to maximize calories and protein this stay. Will need to obtain history if possible as pt was found down for unknown period of time PTA.   Weight noted to increase from 67.2 kg yesterday to 74.3 kg today.   Plan to transfer to progressive care today.   I/O: +8,185 ml since admit UOP: 910 ml x 24 hrs   Drips: 1/2NS @ 100 ml/hr Medications: SS novolog, liquid MVI Labs: Na 147 (H) K 3.3 (L) Phosphorus 2.4 (L)   Diet Order:   Diet Order            DIET SOFT Room service appropriate? Yes; Fluid consistency: Thin  Diet effective now              EDUCATION NEEDS:   Not appropriate for education at this time  Skin:  Skin Assessment: Skin Integrity Issues: Skin Integrity Issues:: Stage I, DTI DTI: R ear, R shoulder, R hip, R knee, R elbow Stage I: sacrum  Last BM:  4/16  Height:   Ht Readings from Last 1 Encounters:  10/04/18 5\' 5"  (1.651 m)    Weight:   Wt Readings from Last 1 Encounters:  10/04/18 74.3 kg    Ideal Body Weight:  56.8 kg  BMI:  Body mass index is 27.26 kg/m.  Estimated Nutritional Needs:    Kcal:  1650-1850 kcal  Protein:  80-95 grams  Fluid:  >/= 1.6 L/day    Vanessa Kick RD, LDN Clinical Nutrition Pager # - [phone]

## 2018-10-04 NOTE — Progress Notes (Signed)
After turning pt, pt was noted to have minor paralysis of the right lower face. Pt has been favoring the right side with head turned towards the right. Pt only intermittently following commands, but was able to raise eye brows evenly, smile with some improvement of facial paralysis. Pupils noted to be reactive to light, size equal, pupils round with small irregularity in the right lower pupil. MD oncall from [facility] was notified. New orders for Q4 Neuro checks was placed.  RN will continue to monitor pt closely.

## 2018-10-04 NOTE — Procedures (Signed)
Extubation Procedure Note  Patient Details:   Name: Jordan Reed DOB: 13-Apr-1951 MRN: [id number]   Airway Documentation:    Vent end date: 10/04/18 Vent end time: 1025   Evaluation  O2 sats: stable throughout Complications: No apparent complications Patient did tolerate procedure well. Bilateral Breath Sounds: Diminished, Rhonchi   Yes  Toula Moos 10/04/2018, 10:29 AM

## 2018-10-04 NOTE — Progress Notes (Signed)
Sputum sample obtained and sent to the lab by RT. 

## 2018-10-05 ENCOUNTER — Inpatient Hospital Stay (HOSPITAL_COMMUNITY): Payer: Medicare HMO

## 2018-10-05 DIAGNOSIS — G9340 Encephalopathy, unspecified: Secondary | ICD-10-CM

## 2018-10-05 LAB — CBC
HCT: 35.1 % — ABNORMAL LOW (ref 36.0–46.0)
Hemoglobin: 11.7 g/dL — ABNORMAL LOW (ref 12.0–15.0)
MCH: 29.2 pg (ref 26.0–34.0)
MCHC: 33.3 g/dL (ref 30.0–36.0)
MCV: 87.5 fL (ref 80.0–100.0)
Platelets: 152 10*3/uL (ref 150–400)
RBC: 4.01 MIL/uL (ref 3.87–5.11)
RDW: 14.9 % (ref 11.5–15.5)
WBC: 9 10*3/uL (ref 4.0–10.5)
nRBC: 0 % (ref 0.0–0.2)

## 2018-10-05 LAB — HEPARIN LEVEL (UNFRACTIONATED)
Heparin Unfractionated: 0.48 IU/mL (ref 0.30–0.70)
Heparin Unfractionated: 0.64 IU/mL (ref 0.30–0.70)
Heparin Unfractionated: 0.81 IU/mL — ABNORMAL HIGH (ref 0.30–0.70)

## 2018-10-05 LAB — GLUCOSE, CAPILLARY
Glucose-Capillary: 100 mg/dL — ABNORMAL HIGH (ref 70–99)
Glucose-Capillary: 102 mg/dL — ABNORMAL HIGH (ref 70–99)
Glucose-Capillary: 108 mg/dL — ABNORMAL HIGH (ref 70–99)
Glucose-Capillary: 125 mg/dL — ABNORMAL HIGH (ref 70–99)
Glucose-Capillary: 155 mg/dL — ABNORMAL HIGH (ref 70–99)
Glucose-Capillary: 90 mg/dL (ref 70–99)
Glucose-Capillary: 99 mg/dL (ref 70–99)

## 2018-10-05 LAB — BASIC METABOLIC PANEL
Anion gap: 9 (ref 5–15)
BUN: 37 mg/dL — ABNORMAL HIGH (ref 8–23)
CO2: 17 mmol/L — ABNORMAL LOW (ref 22–32)
Calcium: 9.2 mg/dL (ref 8.9–10.3)
Chloride: 118 mmol/L — ABNORMAL HIGH (ref 98–111)
Creatinine, Ser: 0.97 mg/dL (ref 0.44–1.00)
GFR calc Af Amer: >60 mL/min (ref >60)
GFR calc non Af Amer: >60 mL/min (ref >60)
Glucose, Bld: 115 mg/dL — ABNORMAL HIGH (ref 70–99)
Potassium: 3.7 mmol/L (ref 3.5–5.1)
Sodium: 144 mmol/L (ref 135–145)

## 2018-10-05 LAB — URINE CULTURE: Culture: NO GROWTH

## 2018-10-05 LAB — CK: Total CK: 353 U/L — ABNORMAL HIGH (ref 38–234)

## 2018-10-05 MED ORDER — DILTIAZEM HCL-DEXTROSE 100-5 MG/100ML-% IV SOLN (PREMIX)
5.0000 mg/h | INTRAVENOUS | Status: DC
  Administered 2018-10-05: 5 mg/h via INTRAVENOUS
  Administered 2018-10-06 (×2): 15 mg/h via INTRAVENOUS
  Filled 2018-10-05 (×3): qty 100

## 2018-10-05 MED ORDER — DILTIAZEM LOAD VIA INFUSION: 10.0000 mg | Freq: Four times a day (QID) | INTRAVENOUS | Status: DC | PRN

## 2018-10-05 MED ORDER — LACTATED RINGERS IV SOLN
INTRAVENOUS | Status: AC
  Administered 2018-10-05: 10:00:00 via INTRAVENOUS

## 2018-10-05 MED ORDER — DILTIAZEM HCL 25 MG/5ML IV SOLN
10.0000 mg | Freq: Once | INTRAVENOUS | Status: AC
  Administered 2018-10-05: 10 mg via INTRAVENOUS
  Filled 2018-10-05: qty 5

## 2018-10-05 MED ORDER — OSMOLITE 1.2 CAL PO LIQD
1000.0000 mL | ORAL | Status: DC
  Administered 2018-10-05: 1000 mL
  Filled 2018-10-05 (×2): qty 1000

## 2018-10-05 MED ORDER — DILTIAZEM HCL 25 MG/5ML IV SOLN
10.0000 mg | Freq: Four times a day (QID) | INTRAVENOUS | Status: DC | PRN
  Administered 2018-10-05 – 2018-10-08 (×4): 10 mg via INTRAVENOUS
  Filled 2018-10-05 (×8): qty 5

## 2018-10-05 MED ORDER — ORAL CARE MOUTH RINSE
15.0000 mL | Freq: Two times a day (BID) | OROMUCOSAL | Status: DC
  Administered 2018-10-05 – 2018-10-15 (×20): 15 mL via OROMUCOSAL

## 2018-10-05 MED ORDER — METOPROLOL TARTRATE 5 MG/5ML IV SOLN
5.0000 mg | Freq: Four times a day (QID) | INTRAVENOUS | Status: DC
  Administered 2018-10-05 – 2018-10-06 (×4): 5 mg via INTRAVENOUS
  Filled 2018-10-05 (×4): qty 5

## 2018-10-05 NOTE — Progress Notes (Signed)
Failed swallow evaluation, cortrak ordered, but no availability today. Will place NG

## 2018-10-05 NOTE — Progress Notes (Signed)
NAME:  Jordan Reed, MRN:  [id number], DOB:  1950/09/22, LOS: 3 ADMISSION DATE:  10/02/2018, CONSULTATION DATE: 10/02/2018 REFERRING MD: Dr. Pilar Plate, CHIEF COMPLAINT: Altered mental status  Brief History   68 year old female found down with altered mental status admitted 4/15 with acute renal failure from dehydration/rhabdomyolysis.  Intubated in the ED for airway protection.  Past Medical History   has no past medical history on file.  Significant Hospital Events   4/15 admit 4/16: no longer in AF w/ RVR, back in NSR. Na slowly improving, IVFs changed to `1/2 NS; scr improved 4/17: more awake. Interactive. Intermittently follows commands hemodynamically stable overnight. Cr improved. Na improved. Ready for extubation spiked fever early in am. Cultures sent abx started.  4/18: A little more awake.  Tolerated extubation.  Remains encephalopathic. Consults:    Procedures:  ETT 4/15 >4/17  Significant Diagnostic Tests:  CT abdomen 4/15 > No acute abnormality. Dilatation of the common bile duct 15 mm, above normal limits for a post cholecystectomy patient. Is the patient's bilirubin elevated CT chest 4/15 > no acute abnormality CT head 4/15 > no acute findings.  Hip X-rays on admit > neg ECHO 4/16: hyperdynamic systolic fxn, EF > 65%  Micro Data:  Urine Cx 4/15 > negative Sputum 4/16 abundant GNR>>> Antimicrobials:  Vancomycin 4/17>>> 4/18 Zosyn 4/17>>>  Interim history/subjective:  No distress but remains encephalopathic Objective   Blood pressure 138/88, pulse (Abnormal) 114, temperature 99.1 F (37.3 C), resp. rate (Abnormal) 25, height 5\' 5"  (1.651 m), weight 67.7 kg, SpO2 96 %.        Intake/Output Summary (Last 24 hours) at 10/05/2018 0905 Last data filed at 10/05/2018 0700 Gross per 24 hour  Intake 2659.06 ml  Output 1425 ml  Net 1234.06 ml   Filed Weights   10/03/18 0400 10/04/18 0500 10/05/18 0500  Weight: 67.2 kg 74.3 kg 67.7 kg    Examination: General: This  is a debilitated chronically ill-appearing 68 year old female she appears much older than stated age she remains encephalopathic HEENT normocephalic atraumatic mucous membranes are moist dentition is poor Pulmonary: Clear to auscultation without accessory use Cardiac: Currently regular rate and rhythm Abdomen: Soft not tender Extremities: Warm, dry dependent edema is appreciated pulses are strong Neuro: Opens eyes, speech is slurred, oriented x1 at best.  Peers to have some left-sided weakness/neglect she will not follow commands but localizes quickly GU: Clear yellow  Resolved Hospital Problem list   SVT/Afib w/ RVR Acute kidney injury secondary to rhabdo Assessment & Plan:    Acute metabolic encephalopathy: likely in the setting of uremia and hypernatremia; sounds like has h/o dementia per d/w grand-daughter.  She seems to have some left-sided neglect, I wonder if she has had a CVA which precipitated all of this Plan MRI brain Hold sedation  Fluid and electrolyte imbalance 2/2 dehydration w/ resultant Severe hypernatremia, also has mixed anion gap and NAG metabolic acidosis; suspect somewhat exacerbated ny NaCl resuscitation.  Sodium is normalized, continues to have non-anion gap metabolic acidosis Plan KVO IV fluids We will get swallow evaluation, if fails will need NG tube/Panda to ensure adequate water intake A.m. chemistry   Acute hypoxemic respiratory failure w/ RLL ATX ? Aspiration  Plan SLP eval Wean oxygen Pulse oximetry Day #2 antibiotics: See below  AF w/ RVR; now NSR Plan Continue heparin for now although I do not think she is a long-term anticoagulation candidate   Mild leukocytosis w/ fever spike:  1 out of 3 blood cultures positive  suspect this is a contamination Sputum with abundant white blood cells and few GNR Fever curve improved Plan Continue to trend fever and white blood cell curve  Follow-up pending respiratory culture  Day #2 antibiotics, we can  discontinue vancomycin and continue Zosyn   History of hypothyroidism, currently hyperthyroid -[place] could she have been taking too much of her Synthroid Plan Per IM service   Best practice:  Diet: NPO - advance OG Pain/Anxiety/Delirium protocol (if indicated): Propofol for RASS goal 0 to -2 VAP protocol (if indicated): Per protocol DVT prophylaxis: heparin infusion GI prophylaxis: PPI Glucose control: SSI Mobility: BR Code Status: FULL Family Communication: no answer at only number in chart. Social work consult asked. Pharmacists attempting to reach out to various pharmacies to determine home meds.  Disposition: She is tolerated extubation.  She remains encephalopathic.  She seems to neglect the left side.  I wonder if she has had a stroke at some point, this was not appreciated on initial CT imaging, I think she needs an MRI to further evaluate this.  This would be a good explanation as to why she ended up in the situation she did, perhaps she suffered a CVA followed by prolonged immobility resulting in rhabdo renal failure worsening metabolic derangements etc.  There is also some concern about thyroid and being hyperthyroid at this point could she have taken too much of her Synthroid.  Suppose it is also a consideration in regards to the SVT.  Critical care will sign off  Simonne MartinetPeter E Eleftheria Taborn ACNP-BC [facility] Pulmonary/Critical Care Pager # [phone] OR # [phone] if no answer

## 2018-10-05 NOTE — Progress Notes (Signed)
PROGRESS NOTE    Jordan Reed  LSL:[id number] DOB: 29-Apr-1951 DOA: 10/02/2018 PCP: Patient, No Pcp Per   Brief Narrative:  Per HPI: 68 year old female with no known past medical history. She has no medical records with [place] and I have queried [place], and [facility] with no files found. She apparently lives at home but has very active involvement of a caregiver who was unfortunately hospitalized 4 days ago. Since that time the patient has been by herself. It is unclear what level of assistance she requires at baseline. A car driving past her house noticed her to be slumped over on the front porch on 10/02/2018. EMS was alerted and upon their arrival she had altered mental status. They hooked her up to EKG and found her to be profoundly tachycardic with a narrow complex rhythm. They felt as though her symptoms may be secondary to symptomatic SVT. She was cardioverted twice without effect. In the emergency department she remained tachycardic. She was somewhat cooperative was staff and able to give a limited history, however, she became increasingly agitated and required intubation for airway protection. Laboratory evaluation consistent with dehydration rhabdomyolysis and therefore IV fluid resuscitation was given. She received 3 L of crystalloid and then was started on infusion at 125 an hour.  Patient admitted for hypoxemic respiratory failure as well as metabolic encephalopathy related to AKI with rhabdomyolysis. She is also noted to have atrial fibrillation with RVR for which she was started on heparin drip as well as amiodarone.  Patient has since converted to sinus tachycardia and amiodarone has been discontinued.  She continues to have ongoing tachycardia as well as some PVCs.  Rhabdomyolysis is improving.  She was noted to have fever overnight on 4/16 for which she was initially given vancomycin as well as Zosyn.  Vancomycin has been discontinued and patient remains on Zosyn with  sputum cultures demonstrating gram-negative rods.  She continues to have persistent encephalopathy with some facial droop and concern for CVA for which brain MRI has been ordered today.  She was extubated on 4/17 and is in stable condition for transfer to progressive unit this morning.  Assessment & Plan:   Active Problems:   Rhabdomyolysis   Altered mental status   Pressure injury of skin   AKI (acute kidney injury) ([place])   Acute encephalopathy   Acute respiratory failure ([place])  1. AKI-prerenal and also associated with rhabdomyolysis-improving. Continue on gentle fluids for now until diet established with LR.  Recheck labs in a.m. along with CK. 2. Atrial fibrillation with RVR-converted to SR with ongoing tachycardia and PVCs. Unknown if this is new. Amiodarone DC on 4/16 and patient has remained on SR. Echo on 4/16 with LVEF>65% and hyperdynamic.  Will maintain on Lopressor IV every 6 hours for rate control and monitor in progressive unit. Continue on heparin drip for now until dietary tolerance established. Likely not a good long-term candidate for anticoagulation. 3. Acute-ongoing encephalopathy.  This is suspected to be metabolic in relation to above with possible Synthroid overdose which is now being withheld.  She does have some facial droop and suggestion of possible CVA for which brain MRI has been ordered by PCCM today. 4. Hypoxemic respiratory failure status post intubation-extubated on 4/17 by PCCM with no overnight events noted.  She does have gram-negative rods in sputum with further ID and sensitivities pending.  She therefore remains on Zosyn, but has been afebrile overnight. 5. History of diabetes. Hyperglycemia currently well controlled. 6. History of hypertension.  Maintain on aggressive IV fluid hydration and monitor closely. Will add labetalol prn bp elevations.  We will also add metoprolol for heart rate control every 6 hours.   DVT prophylaxis:Heparin drip Code  Status:DNR Family Communication:Will call Daughter to update Disposition Plan:Continue IV fluid hydration and rate control with IV metoprolol now ordered every 6 hours.  SLP evaluation pending with diet advancement at that point.  Brain MRI today due to persistent encephalopathy and possible CVA.  Transfer to progressive unit.  Patient will likely require SNF placement on discharge with PT/OT evaluation pending.  Rhabdomyolysis improved.  Will need to follow sputum cultures with gram-negative rods noted.   Consultants:  PCCM  Procedures:  None  Antimicrobials:   Vancomycin 4/17-4/17  Zosyn 4/17->  Subjective: Patient seen and evaluated today with no new acute complaints or concerns. No acute concerns or events noted overnight. She continues to have confusion and tachycardia with PVCs.  Objective: Vitals:   10/05/18 0600 10/05/18 0700 10/05/18 0800 10/05/18 0900  BP: 117/86 134/83 138/88 (!) 142/83  Pulse: (!) 108 82 (!) 114 (!) 109  Resp: (!) 23 (!) 23 (!) 25 (!) 25  Temp: 99.3 F (37.4 C) 99.3 F (37.4 C) 99.1 F (37.3 C)   TempSrc:      SpO2: 98% 98% 96% 96%  Weight:      Height:        Intake/Output Summary (Last 24 hours) at 10/05/2018 0958 Last data filed at 10/05/2018 0900 Gross per 24 hour  Intake 2880.89 ml  Output 1425 ml  Net 1455.89 ml   Filed Weights   10/03/18 0400 10/04/18 0500 10/05/18 0500  Weight: 67.2 kg 74.3 kg 67.7 kg    Examination:  General exam: Appears calm and comfortable  Respiratory system: Clear to auscultation. Respiratory effort normal. Cardiovascular system: S1 & S2 heard, RRR and tachycardic. No JVD, murmurs, rubs, gallops or clicks. No pedal edema. Gastrointestinal system: Abdomen is nondistended, soft and nontender. No organomegaly or masses felt. Normal bowel sounds heard. Central nervous system: Alert and awake. Extremities: No edema. Skin: No rashes, lesions or ulcers Psychiatry: Cannot be evaluated  fully.    Data Reviewed: I have personally reviewed following labs and imaging studies  CBC: Recent Labs  Lab 10/02/18 1100  10/02/18 1234 10/02/18 1839 10/03/18 0437 10/04/18 0327 10/05/18 0319  WBC 11.3*  --   --   --  12.1* 7.8 9.0  NEUTROABS 9.7*  --   --   --   --   --   --   HGB 14.9   < > 11.9* 11.9* 12.9 11.1* 11.7*  HCT 47.5*   < > 35.0* 35.0* 38.4 33.8* 35.1*  MCV 89.0  --   --   --  86.9 87.1 87.5  PLT 286  --   --   --  213 167 152   < > = values in this interval not displayed.   Basic Metabolic Panel: Recent Labs  Lab 10/02/18 1833 10/02/18 1839  10/02/18 2325 10/03/18 0437 10/03/18 1032 10/03/18 1625 10/04/18 0327 10/04/18 1933 10/05/18 0319  NA 153* 153*  --  152* 152*  --   --  147*  --  144  K 3.1* 2.9*  --  3.5 3.6  --   --  3.3*  --  3.7  CL 123*  --   --  124* 123*  --   --  120*  --  118*  CO2 16*  --   --  16* 15*  --   --  22  --  17*  GLUCOSE 167*  --   --  137* 164*  --   --  148*  --  115*  BUN 70*  --   --  71* 71*  --   --  60*  --  37*  CREATININE 2.65*  --   --  2.44* 2.10*  --   --  1.23*  --  0.97  CALCIUM 8.6*  --   --  8.7* 8.8*  --   --  9.0  --  9.2  MG  --   --    < > 2.1 2.2 2.3 2.2 2.2 2.1  --   PHOS  --   --   --   --  4.9* 4.3 3.6 2.4* 2.3*  --    < > = values in this interval not displayed.   GFR: Estimated Creatinine Clearance: 50.6 mL/min (by C-G formula based on SCr of 0.97 mg/dL). Liver Function Tests: Recent Labs  Lab 10/02/18 1100 10/03/18 1625 10/04/18 0327  AST 74* 49* 44*  ALT 43 37 34  ALKPHOS 95 71 67  BILITOT 2.4* 0.4 0.7  PROT 6.4* 4.7* 4.4*  ALBUMIN 3.4* 2.2* 2.0*   No results for input(s): LIPASE, AMYLASE in the last 168 hours. No results for input(s): AMMONIA in the last 168 hours. Coagulation Profile: Recent Labs  Lab 10/02/18 1100  INR 1.2   Cardiac Enzymes: Recent Labs  Lab 10/02/18 1100 10/03/18 0437 10/04/18 0327 10/05/18 0319  CKTOTAL 2,642* 1,594* 692* 353*  CKMB 42.8*  --    --   --   TROPONINI 0.08*  --   --   --    BNP (last 3 results) No results for input(s): PROBNP in the last 8760 hours. HbA1C: No results for input(s): HGBA1C in the last 72 hours. CBG: Recent Labs  Lab 10/04/18 1600 10/04/18 2031 10/05/18 0046 10/05/18 0419 10/05/18 0733  GLUCAP 96 98 100* 108* 90   Lipid Profile: Recent Labs    10/03/18 0437 10/04/18 0327  TRIG 75 56   Thyroid Function Tests: Recent Labs    10/03/18 0437 10/03/18 1032  TSH <0.010*  --   FREET4  --  4.07*   Anemia Panel: No results for input(s): VITAMINB12, FOLATE, FERRITIN, TIBC, IRON, RETICCTPCT in the last 72 hours. Sepsis Labs: Recent Labs  Lab 10/02/18 1100 10/03/18 1125 10/04/18 0327  LATICACIDVEN 4.5* 1.2 1.2    Recent Results (from the past 240 hour(s))  Culture, blood (Routine x 2)     Status: None (Preliminary result)   Collection Time: 10/02/18 11:00 AM  Result Value Ref Range Status   Specimen Description BLOOD LEFT ARM  Final   Special Requests   Final    BOTTLES DRAWN AEROBIC AND ANAEROBIC Blood Culture results may not be optimal due to an inadequate volume of blood received in culture bottles   Culture  Setup Time   Final    GRAM POSITIVE RODS CRITICAL RESULT CALLED TO, READ BACK BY AND VERIFIED WITH: RHRMD V BRTK _0  10/05/18 BY S GEZAHEGN Performed at [facility], [place] [address], [place], [place] [address]    Culture GRAM POSITIVE RODS  Final   Report Status PENDING  Incomplete  Culture, blood (Routine x 2)     Status: None (Preliminary result)   Collection Time: 10/02/18 11:03 AM  Result Value Ref Range Status   Specimen Description BLOOD LEFT HAND  Final   Special Requests   Final    BOTTLES DRAWN AEROBIC AND ANAEROBIC Blood Culture results may not be optimal due to an inadequate volume of blood received in culture bottles   Culture   Final    NO GROWTH 3 DAYS Performed at [facility], [place] [address], [place], [place] [address]    Report Status  PENDING  Incomplete  MRSA PCR Screening     Status: None   Collection Time: 10/02/18 11:49 PM  Result Value Ref Range Status   MRSA by PCR NEGATIVE NEGATIVE Final    Comment:        The GeneXpert MRSA Assay (FDA approved for NASAL specimens only), is one component of a comprehensive MRSA colonization surveillance program. It is not intended to diagnose MRSA infection nor to guide or monitor treatment for MRSA infections. Performed at [facility], [place] [address], [place], [place] [address]   Culture, respiratory (non-expectorated)     Status: None (Preliminary result)   Collection Time: 10/04/18  3:25 AM  Result Value Ref Range Status   Specimen Description TRACHEAL ASPIRATE  Final   Special Requests NONE  Final   Gram Stain   Final    ABUNDANT WBC PRESENT, PREDOMINANTLY PMN FEW GRAM NEGATIVE RODS Performed at [facility], [place] [address] [place], [place] [address]    Culture PENDING  Incomplete   Report Status PENDING  Incomplete  Culture, blood (Routine X 2) w Reflex to ID Panel     Status: None (Preliminary result)   Collection Time: 10/04/18  3:48 AM  Result Value Ref Range Status   Specimen Description BLOOD LEFT ANTECUBITAL  Final   Special Requests   Final    BOTTLES DRAWN AEROBIC ONLY Blood Culture results may not be optimal due to an inadequate volume of blood received in culture bottles   Culture   Final    NO GROWTH 1 DAY Performed at [facility], [place] [address] [place], [place] [address]    Report Status PENDING  Incomplete  Culture, blood (Routine X 2) w Reflex to ID Panel     Status: None (Preliminary result)   Collection Time: 10/04/18  3:48 AM  Result Value Ref Range Status   Specimen Description BLOOD LEFT HAND  Final   Special Requests   Final    BOTTLES DRAWN AEROBIC ONLY Blood Culture results may not be optimal due to an inadequate volume of blood received in culture bottles   Culture   Final    NO GROWTH 1 DAY Performed at  [facility] [place] [facility], [place] [address], [place],  [address]    Report Status PENDING  Incomplete         Radiology Studies: Dg Abd Portable 1v  Result Date: 10/03/2018 CLINICAL DATA:  68 year old female with a history of gastric tube placement EXAM: PORTABLE ABDOMEN - 1 VIEW COMPARISON:  None. FINDINGS: Gastric tube terminates near the pylorus or potentially post pyloric, in the right abdomen. Surgical changes of the epigastric region/left upper quadrant, as well as cholecystectomy. Gas within small bowel and colon. No abnormal distention. Pelvic thermister in place. IMPRESSION: Gastric tube terminates within the stomach, at the pyloric region or potentially post pyloric. Electronically Signed   By: Corrie Mckusick D.O.   On: 10/03/2018 13:15        Scheduled Meds:  chlorhexidine gluconate (MEDLINE KIT)  15 mL Mouth Rinse BID   Chlorhexidine Gluconate Cloth  6 each Topical Daily   feeding supplement (ENSURE ENLIVE)  237 mL Oral BID BM   free water  200 mL Per Tube Q8H   insulin aspart  0-15 Units Subcutaneous Q4H   mouth rinse  15 mL Mouth Rinse BID   metoprolol tartrate  5 mg Intravenous Q6H   metoprolol tartrate  12.5 mg Oral BID   multivitamin with minerals  1 tablet Oral Daily   sodium chloride flush  3 mL Intravenous Once   Continuous Infusions:  sodium chloride 10 mL/hr at 10/05/18 0947   heparin 1,400 Units/hr (10/05/18 0909)   lactated ringers     piperacillin-tazobactam (ZOSYN)  IV Stopped (10/05/18 0846)     LOS: 3 days    Time spent: 30 minutes    Gaelyn Tukes Darleen Crocker, DO [facility] Pager [phone]  If 7PM-7AM, please contact night-coverage [email or web address] Password [email or web address] 10/05/2018, 9:58 AM

## 2018-10-05 NOTE — Progress Notes (Signed)
Attempted NG placement x2 right nare, patient swallow poor. Coughing. no air bolus on stomach heard.  Taken out. Patient coughing with clear secretions dribbling out of right side.

## 2018-10-05 NOTE — Evaluation (Signed)
Physical Therapy Evaluation Patient Details Name: Jordan Reed MRN: [id number] DOB: 1950-12-13 Today's Date: 10/05/2018   History of Present Illness  68 year old female with no known past medical history.  She has no medical records with [place] and I have queried [place], [place], and [facility] with no files found.  She apparently lives at home but has very active involvement of a caregiver who was unfortunately hospitalized 4 days ago.  Since that time the patient has been by herself.  It is unclear what level of assistance she requires at baseline.  A car driving past her house noticed her to be slumped over on the front porch on 10/02/2018.  EMS was alerted and upon their arrival she had altered mental status.  They hooked her up to EKG and found her to be profoundly tachycardic with a narrow complex rhythm.  They felt as though her symptoms may be secondary to symptomatic SVT.  She was cardioverted twice without effect.  In the emergency department she remained tachycardic.  She was somewhat cooperative was staff and able to give a limited history, however, she became increasingly agitated and required intubation 4/15 for airway protection.  Laboratory evaluation consistent with dehydration rhabdomyolysis and therefore IV fluid resuscitation was given. Extubated 4/17.    Clinical Impression  Pt admitted with above diagnosis. Pt currently with functional limitations due to the deficits listed below (see PT Problem List). Pt was able to sit EOB with max assist for a few min.  Pt having BM which continued the entire time sitting.  Pt fatigued quite quickly therefore laid pt down as well as pt also going for test and tech waiting for pt.  Will follow acutely.  Pt will benefit from skilled PT to increase their independence and safety with mobility to allow discharge to the venue listed below.      Follow Up Recommendations SNF;Supervision/Assistance - 24 hour    Equipment Recommendations  Other (comment)(TBA)    Recommendations for Other Services       Precautions / Restrictions Precautions Precautions: Fall Restrictions Weight Bearing Restrictions: No      Mobility  Bed Mobility Overal bed mobility: Needs Assistance Bed Mobility: Rolling;Supine to Sit Rolling: Max assist;+2 for physical assistance   Supine to sit: Max assist;+2 for physical assistance     General bed mobility comments: Needed max assist as pt not initiating movement.  not sustaining movement once moved.  Needed incr assist for all mobility.   Transfers Overall transfer level: Needs assistance Equipment used: 2 person hand held assist Transfers: Sit to/from Stand Sit to Stand: Max assist;+2 physical assistance;From elevated surface         General transfer comment: Attempted to stand to RW and pt could not clear buttocks off bed and would not reach for RW.  Pt then assisted with +2 HHa and blocked knees as pt was actively having a BM and nursing cleaned pt during 3 stands in which pt still not fully standing.   Ambulation/Gait                Stairs            Wheelchair Mobility    Modified Rankin (Stroke Patients Only)       Balance Overall balance assessment: Needs assistance Sitting-balance support: Feet supported;Bilateral upper extremity supported Sitting balance-Leahy Scale: Zero Sitting balance - Comments: needed max assist to sit EOB with right and posterior lean Postural control: Right lateral lean;Posterior lean Standing balance support: Bilateral upper  extremity supported;During functional activity Standing balance-Leahy Scale: Zero Standing balance comment: Pt required heavy +2 max assist to stand with knees blovked.                              Pertinent Vitals/Pain Pain Assessment: Faces Faces Pain Scale: Hurts little more Pain Location: neck, UEs, LEs Pain Descriptors / Indicators: Discomfort;Grimacing;Guarding Pain Intervention(s): Limited activity within  patient's tolerance;Monitored during session;Repositioned    Home Living Family/patient expects to be discharged to:: Private residence Living Arrangements: Non-relatives/Friends(per chart had caregiver) Available Help at Discharge: Other (Comment)(unsure as caregiver was hospitlized per chart) Type of Home: House Home Access: Stairs to enter       Home Equipment: (unsure)      Prior Function           Comments: unsure as no family available, pt mumbles and cannot ansser questjons.      Hand Dominance        Extremity/Trunk Assessment   Upper Extremity Assessment Upper Extremity Assessment: Defer to OT evaluation    Lower Extremity Assessment Lower Extremity Assessment: Generalized weakness    Cervical / Trunk Assessment Cervical / Trunk Assessment: Kyphotic  Communication   Communication: Other (comment)(mumbles and unintellgible speech)  Cognition Arousal/Alertness: Lethargic Behavior During Therapy: Flat affect Overall Cognitive Status: Impaired/Different from baseline Area of Impairment: Orientation;Memory;Following commands;Safety/judgement;Awareness;Problem solving;JFK Recovery Scale                 Orientation Level: Disoriented to;Place;Time;Situation   Memory: Decreased recall of precautions;Decreased short-term memory Following Commands: Follows one step commands inconsistently Safety/Judgement: Decreased awareness of safety;Decreased awareness of deficits   Problem Solving: Slow processing;Decreased initiation;Requires verbal cues;Difficulty sequencing;Requires tactile cues General Comments: Pt unable to follow commdands consistently.  confused.        General Comments General comments (skin integrity, edema, etc.): Pt with raw vagina and buttocks and nurse put cream and microguard powder on it after cleaning pt.     Exercises     Assessment/Plan    PT Assessment Patient needs continued PT services  PT Problem List Decreased activity  tolerance;Decreased strength;Decreased range of motion;Decreased balance;Decreased mobility;Decreased knowledge of use of DME;Decreased safety awareness;Decreased knowledge of precautions;Decreased cognition;Pain       PT Treatment Interventions DME instruction;Gait training;Therapeutic activities;Functional mobility training;Therapeutic exercise;Balance training;Patient/family education    PT Goals (Current goals can be found in the Care Plan section)  Acute Rehab PT Goals Patient Stated Goal: unable to state PT Goal Formulation: Patient unable to participate in goal setting Time For Goal Achievement: 10/19/18 Potential to Achieve Goals: Good    Frequency Min 2X/week   Barriers to discharge Decreased caregiver support(unsure as chart states caregiver hospitalized 4 days before )      Co-evaluation               AM-PAC PT "6 Clicks" Mobility  Outcome Measure Help needed turning from your back to your side while in a flat bed without using bedrails?: Total Help needed moving from lying on your back to sitting on the side of a flat bed without using bedrails?: Total Help needed moving to and from a bed to a chair (including a wheelchair)?: Total Help needed standing up from a chair using your arms (e.g., wheelchair or bedside chair)?: Total Help needed to walk in hospital room?: Total Help needed climbing 3-5 steps with a railing? : Total 6 Click Score: 6    End of  Session Equipment Utilized During Treatment: Gait belt Activity Tolerance: Patient limited by fatigue(limited by diarrhea) Patient left: in bed;with call bell/phone within reach;with nursing/sitter in room Nurse Communication: Mobility status PT Visit Diagnosis: Unsteadiness on feet (R26.81);History of falling (Z91.81);Muscle weakness (generalized) (M62.81);Pain Pain - part of body: (generalized)    Time: [id number] PT Time Calculation (min) (ACUTE ONLY): 24 min   Charges:   PT Evaluation $PT Eval Moderate  Complexity: 1 Mod PT Treatments $Therapeutic Activity: 8-22 mins        Maeghan Canny,PT Acute Rehabilitation Services Pager:  [phone]  Office:  [phone]    Berline LopesDawn F Jeyda Siebel 10/05/2018, 12:38 PM

## 2018-10-05 NOTE — Progress Notes (Signed)
Down to MRI and returned without incident stable, resting with eyes closed in MRI. sao2 93% on room air laying down. HR 100's PVCs

## 2018-10-05 NOTE — Progress Notes (Signed)
ANTICOAGULATION CONSULT NOTE   Pharmacy Consult for heparin Indication: atrial fibrillation  Allergies  Allergen Reactions  . Codeine Hives and Itching    Patient Measurements: Height: 5\' 5"  (165.1 cm) Weight: 149 lb 4 oz (67.7 kg) IBW/kg (Calculated) : 57 Heparin Dosing Weight: 73kg  Vital Signs: Temp: 99.1 F (37.3 C) (04/18 0800) Temp Source: Bladder (04/18 0400) BP: 141/76 (04/18 1300) Pulse Rate: 95 (04/18 1300)  Labs: Recent Labs    10/02/18 2325 10/03/18 0437 10/04/18 0327 10/04/18 1502 10/05/18 0022 10/05/18 0319 10/05/18 1053  HGB  --  12.9 11.1*  --   --  11.7*  --   HCT  --  38.4 33.8*  --   --  35.1*  --   PLT  --  213 167  --   --  152  --   APTT 85*  --   --   --   --   --   --   HEPARINUNFRC 0.38 0.49 0.18* 0.17* 0.64  --  0.81*  CREATININE 2.44* 2.10* 1.23*  --   --  0.97  --   CKTOTAL  --  1,594* 692*  --   --  353*  --     Estimated Creatinine Clearance: 50.6 mL/min (by C-G formula based on SCr of 0.97 mg/dL).   Medical History: History reviewed. No pertinent past medical history.  Medications:  Infusions:  . sodium chloride 10 mL/hr at 10/05/18 0947  . heparin 1,400 Units/hr (10/05/18 0909)  . lactated ringers 75 mL/hr at 10/05/18 1013  . piperacillin-tazobactam (ZOSYN)  IV 3.375 g (10/05/18 1243)    Assessment: 79 yof presented to the ED with tachycardia and altered mental status. To start IV heparin for afib. Baseline CBC is WNL. INR is 1.2 and troponin is elevated. It is unknown if she is on anticoagulation PTA.   Confirmatory heparin level came back supratherapeutic at 0.81 on 1400 units/hr.  Hgb stable at 11.7, plt 152. No major s/sx of bleeding (some bruising noted) or issues with infusion reported by nursing.   Goal of Therapy:  Heparin level 0.3-0.7 units/ml Monitor platelets by anticoagulation protocol: Yes   Plan:  Decrease heparin infusion to 1250 units/hr Heparin level in 6 hours Daily heparin level and CBC  Marcelino Freestone, PharmD PGY2 Cardiology Pharmacy Resident Please check AMION for all Pharmacist numbers by unit 10/05/2018 1:16 PM

## 2018-10-05 NOTE — Progress Notes (Signed)
ANTICOAGULATION CONSULT NOTE   Pharmacy Consult for heparin Indication: atrial fibrillation  Allergies  Allergen Reactions  . Codeine Hives and Itching    Patient Measurements: Height: 5\' 5"  (165.1 cm) Weight: 149 lb 4 oz (67.7 kg) IBW/kg (Calculated) : 57 Heparin Dosing Weight: 73kg  Vital Signs: Temp: 99.4 F (37.4 C) (04/18 2000) Temp Source: Axillary (04/18 2000) BP: 117/78 (04/18 2030) Pulse Rate: 89 (04/18 1900)  Labs: Recent Labs    10/02/18 2325  10/03/18 0437 10/04/18 0327  10/05/18 0022 10/05/18 0319 10/05/18 1053 10/05/18 2043  HGB  --    < > 12.9 11.1*  --   --  11.7*  --   --   HCT  --   --  38.4 33.8*  --   --  35.1*  --   --   PLT  --   --  213 167  --   --  152  --   --   APTT 85*  --   --   --   --   --   --   --   --   HEPARINUNFRC 0.38  --  0.49 0.18*   < > 0.64  --  0.81* 0.48  CREATININE 2.44*  --  2.10* 1.23*  --   --  0.97  --   --   CKTOTAL  --   --  1,594* 692*  --   --  353*  --   --    < > = values in this interval not displayed.    Estimated Creatinine Clearance: 50.6 mL/min (by C-G formula based on SCr of 0.97 mg/dL).   Medical History: History reviewed. No pertinent past medical history.  Medications:  Infusions:  . diltiazem (CARDIZEM) infusion 12.5 mg/hr (10/05/18 2000)  . feeding supplement (OSMOLITE 1.2 CAL) 1,000 mL (10/05/18 1955)  . heparin 1,250 Units/hr (10/05/18 2000)  . lactated ringers 75 mL/hr at 10/05/18 2000  . piperacillin-tazobactam (ZOSYN)  IV 12.5 mL/hr at 10/05/18 2000    Assessment: 67 yof presented to the ED with tachycardia and altered mental status. To start IV heparin for afib. Baseline CBC is WNL. INR is 1.2 and troponin is elevated. It is unknown if she is on anticoagulation PTA.   Repeat heparin level therapeutic at 0.48 after rate adjustment this afternoon, no further adjustments needed for now.  Goal of Therapy:  Heparin level 0.3-0.7 units/ml Monitor platelets by anticoagulation protocol:  Yes   Plan:  Continue heparin infusion to 1250 units/hr Daily heparin level and CBC   Fredonia Highland, PharmD, BCPS Clinical Pharmacist [phone] Please check AMION for all [facility] Pharmacy numbers 10/05/2018

## 2018-10-05 NOTE — Progress Notes (Signed)
ANTICOAGULATION CONSULT NOTE - Follow Up Consult  Pharmacy Consult for heparin Indication: atrial fibrillation  Labs: Recent Labs    10/02/18 1100  10/02/18 1839  10/02/18 2325 10/03/18 0437 10/04/18 0327 10/04/18 1502 10/05/18 0022  HGB 14.9   < > 11.9*  --   --  12.9 11.1*  --   --   HCT 47.5*   < > 35.0*  --   --  38.4 33.8*  --   --   PLT 286  --   --   --   --  213 167  --   --   APTT  --   --   --   --  85*  --   --   --   --   LABPROT 15.4*  --   --   --   --   --   --   --   --   INR 1.2  --   --   --   --   --   --   --   --   HEPARINUNFRC  --   --   --    < > 0.38 0.49 0.18* 0.17* 0.64  CREATININE 3.43*   < >  --   --  2.44* 2.10* 1.23*  --   --   CKTOTAL 2,642*  --   --   --   --  1,594* 692*  --   --   CKMB 42.8*  --   --   --   --   --   --   --   --   TROPONINI 0.08*  --   --   --   --   --   --   --   --    < > = values in this interval not displayed.    Assessment/Plan:  68yo female therapeutic on heparin after rate changes. Will continue gtt at current rate and confirm stable with am labs.   Vernard Gambles, PharmD, BCPS  10/05/2018,1:08 AM

## 2018-10-05 NOTE — Plan of Care (Signed)
Patient still not alert or oriented, not sure of baseline.  Covnerted from ST to Afib, medicated with PRN lopressor and cardizem. Marland Kitchen Awaiting nutrition orders.  Having some loose stools. Orders to stepdown. BP stable.

## 2018-10-06 ENCOUNTER — Encounter (HOSPITAL_COMMUNITY): Payer: Self-pay | Admitting: *Deleted

## 2018-10-06 DIAGNOSIS — I4891 Unspecified atrial fibrillation: Secondary | ICD-10-CM

## 2018-10-06 LAB — COMPREHENSIVE METABOLIC PANEL
ALT: 39 U/L (ref 0–44)
AST: 31 U/L (ref 15–41)
Albumin: 2.2 g/dL — ABNORMAL LOW (ref 3.5–5.0)
Alkaline Phosphatase: 74 U/L (ref 38–126)
Anion gap: 11 (ref 5–15)
BUN: 25 mg/dL — ABNORMAL HIGH (ref 8–23)
CO2: 21 mmol/L — ABNORMAL LOW (ref 22–32)
Calcium: 9.6 mg/dL (ref 8.9–10.3)
Chloride: 112 mmol/L — ABNORMAL HIGH (ref 98–111)
Creatinine, Ser: 0.9 mg/dL (ref 0.44–1.00)
GFR calc Af Amer: >60 mL/min (ref >60)
GFR calc non Af Amer: >60 mL/min (ref >60)
Glucose, Bld: 183 mg/dL — ABNORMAL HIGH (ref 70–99)
Potassium: 3.6 mmol/L (ref 3.5–5.1)
Sodium: 144 mmol/L (ref 135–145)
Total Bilirubin: 0.9 mg/dL (ref 0.3–1.2)
Total Protein: 5.3 g/dL — ABNORMAL LOW (ref 6.5–8.1)

## 2018-10-06 LAB — GLUCOSE, CAPILLARY
Glucose-Capillary: 119 mg/dL — ABNORMAL HIGH (ref 70–99)
Glucose-Capillary: 156 mg/dL — ABNORMAL HIGH (ref 70–99)
Glucose-Capillary: 159 mg/dL — ABNORMAL HIGH (ref 70–99)
Glucose-Capillary: 161 mg/dL — ABNORMAL HIGH (ref 70–99)
Glucose-Capillary: 169 mg/dL — ABNORMAL HIGH (ref 70–99)
Glucose-Capillary: 170 mg/dL — ABNORMAL HIGH (ref 70–99)
Glucose-Capillary: 176 mg/dL — ABNORMAL HIGH (ref 70–99)
Glucose-Capillary: 188 mg/dL — ABNORMAL HIGH (ref 70–99)

## 2018-10-06 LAB — CBC
HCT: 39.6 % (ref 36.0–46.0)
Hemoglobin: 13.2 g/dL (ref 12.0–15.0)
MCH: 29.1 pg (ref 26.0–34.0)
MCHC: 33.3 g/dL (ref 30.0–36.0)
MCV: 87.2 fL (ref 80.0–100.0)
Platelets: 189 10*3/uL (ref 150–400)
RBC: 4.54 MIL/uL (ref 3.87–5.11)
RDW: 14.6 % (ref 11.5–15.5)
WBC: 10.2 10*3/uL (ref 4.0–10.5)
nRBC: 0.2 % (ref 0.0–0.2)

## 2018-10-06 LAB — CULTURE, RESPIRATORY W GRAM STAIN

## 2018-10-06 LAB — CK: Total CK: 114 U/L (ref 38–234)

## 2018-10-06 LAB — HEPARIN LEVEL (UNFRACTIONATED): Heparin Unfractionated: 0.38 IU/mL (ref 0.30–0.70)

## 2018-10-06 MED ORDER — LEVOFLOXACIN IN D5W 750 MG/150ML IV SOLN
750.0000 mg | INTRAVENOUS | Status: DC
  Administered 2018-10-06 – 2018-10-07 (×2): 750 mg via INTRAVENOUS
  Filled 2018-10-06 (×2): qty 150

## 2018-10-06 MED ORDER — METOPROLOL TARTRATE 25 MG/10 ML ORAL SUSPENSION
25.0000 mg | Freq: Two times a day (BID) | ORAL | Status: DC
  Administered 2018-10-06 – 2018-10-09 (×7): 25 mg
  Filled 2018-10-06 (×7): qty 10

## 2018-10-06 MED ORDER — APIXABAN 5 MG PO TABS
5.0000 mg | ORAL_TABLET | Freq: Two times a day (BID) | ORAL | Status: DC
  Administered 2018-10-06 – 2018-10-16 (×21): 5 mg via ORAL
  Filled 2018-10-06 (×21): qty 1

## 2018-10-06 MED ORDER — ADULT MULTIVITAMIN LIQUID CH
15.0000 mL | Freq: Every day | ORAL | Status: DC
  Administered 2018-10-06 – 2018-10-16 (×11): 15 mL via ORAL
  Filled 2018-10-06 (×11): qty 15

## 2018-10-06 NOTE — Progress Notes (Signed)
Facial cellulitis looking more widespread, red to cheek left, abdomen red on right side. Warm to touch both places. Dr. Jomarie Longs paged. New medications eloquis, cardizem, which is off now, as well as heparin.  Daughter called with transfer room number. 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

## 2018-10-06 NOTE — Progress Notes (Addendum)
PROGRESS NOTE    [place] Reed  KZL:[id number] DOB: 1950-07-11 DOA: 10/02/2018 PCP: Patient, No Pcp Per   Brief Narrative:  Per HPI: 68 year old female with advanced dementia, former long-term smoker, diabetes, hypothyroidismshe has no medical records with [place] or in care everywhere.She apparently lives at home but has very active involvement of a caregiver who was unfortunately hospitalized 4 days ago. Since that time the patient has been by herself. A car driving past her house noticed her to be slumped over on the front porch on 10/02/2018. EMS was called, found to be profoundly tachycardic with a narrow complex rhythm, SVT,  cardioverted twice without effect. In the emergency department she remained tachycardic. She was somewhat cooperative was staff and able to give a limited history, however, she became increasingly agitated and required intubation for airway protection 4/15. Laboratory evaluation consistent with dehydration rhabdomyolysis and therefore IV fluid resuscitation was given. Patient admitted for hypoxemic respiratory failure as well as metabolic encephalopathy related to AKI with rhabdomyolysis. She is also noted to have atrial fibrillation with RVR for which she was started on heparin drip as well as amiodarone.  Patient has since converted to sinus tachycardia and amiodarone has been discontinued.    She was noted to have fever overnight on 4/16 for which she was initially given vancomycin as well as Zosyn.  Vancomycin has been discontinued and patient remains on Zosyn with sputum cultures demonstrating gram-negative rods.  She continues to have persistent encephalopathy with some facial droop, MRI negative for CVA. She was extubated on 4/17 -Has dysphagia currently getting tube feeds via NG tube  Assessment & Plan:   1.  Acute hypoxic respiratory failure -Patient was intubated for airway protection on admission due to severe agitation requiring sedation -Extubated 4/17   2. Marland Kitchen AKI- -Prerenal associated with rhabdomyolysis  -Resolved, discontinue IV fluids   3.  Atrial fibrillation with RVR  - complicated by hypotension initially, pretty briefly required amiodarone this was subsequently discontinued -2D echocardiogram with preserved EF, hyperdynamic LV -Currently on IV metoprolol will change to via NG tube -Also on IV heparin, called and discussed with daughter will transition to  Eliquis via NG tube -Low TSH was over suppressed at less than 0.01, unclear if she was taking Synthroid appropriately, currently on hold  4.  Hyperthyroidism -History of hypothyroidism on Synthroid replacement however TSH was less than 0.01 -See discussion above, repeat in few weeks  5.  Dementia -Called daughter patient has advanced dementia at baseline, she is on Aricept and Namenda confused most of the time, able to recognize immediate family members only -Swallow evaluation later today or tomorrow, to assess for p.o. tolerance  6.  Possible pneumonia/fever -X-ray and CT chest revealed right basilar atelectasis vs infiltrate -Sputum culture grew stenotrophomonas maltophilia -Currently on IV Zosyn day 3 -Will transition to oral antibiotics in 24 to 48 hours pneumonia  7.  DM/Hyperglycemia  -check Hemoglobin A1c  8. Long term smoker/suspected COPD  DVT prophylaxis:Heparin drip Code Status:DNR Family Communication:Called and updated daughter Margreta Journey Disposition Plan:to be determined may need rehab  Consultants:  PCCM  Procedures:  None  Antimicrobials:   Vancomycin 4/17-4/17  Zosyn 4/17->  Subjective: -Tolerating tube feeds, no events overnight  Objective: Vitals:   10/06/18 0800 10/06/18 0900 10/06/18 1000 10/06/18 1015  BP: (!) 109/57 (!) 112/52 (!) 116/57   Pulse: 78 89 86 86  Resp: (!) 24 (!) 22 (!) 24 (!) 24  Temp:      TempSrc:  SpO2: 100% 99% 100% 99%  Weight:      Height:        Intake/Output Summary (Last 24 hours) at  10/06/2018 1105 Last data filed at 10/06/2018 1000 Gross per 24 hour  Intake 1803.24 ml  Output 450 ml  Net 1353.24 ml   Filed Weights   10/04/18 0500 10/05/18 0500 10/06/18 0500  Weight: 74.3 kg 67.7 kg 72 kg    Examination:  Gen: Somnolent, arousable, oriented to self and partly to place only HEENT: PERRLA, Neck supple, no JVD Lungs: Decreased breath sounds at both bases, rest clear CVS: S1 S2/regular rate rhythm Abd: soft, Non tender, non distended, BS present Extremities: No edema Skin: no new rashes Psychiatry: Cognitive dysfunction, poor insight and judgment    Data Reviewed: I have personally reviewed following labs and imaging studies  CBC: Recent Labs  Lab 10/02/18 1100  10/02/18 1839 10/03/18 0437 10/04/18 0327 10/05/18 0319 10/06/18 0252  WBC 11.3*  --   --  12.1* 7.8 9.0 10.2  NEUTROABS 9.7*  --   --   --   --   --   --   HGB 14.9   < > 11.9* 12.9 11.1* 11.7* 13.2  HCT 47.5*   < > 35.0* 38.4 33.8* 35.1* 39.6  MCV 89.0  --   --  86.9 87.1 87.5 87.2  PLT 286  --   --  213 167 152 189   < > = values in this interval not displayed.   Basic Metabolic Panel: Recent Labs  Lab 10/02/18 2325 10/03/18 0437 10/03/18 1032 10/03/18 1625 10/04/18 0327 10/04/18 1933 10/05/18 0319 10/06/18 0252  NA 152* 152*  --   --  147*  --  144 144  K 3.5 3.6  --   --  3.3*  --  3.7 3.6  CL 124* 123*  --   --  120*  --  118* 112*  CO2 16* 15*  --   --  22  --  17* 21*  GLUCOSE 137* 164*  --   --  148*  --  115* 183*  BUN 71* 71*  --   --  60*  --  37* 25*  CREATININE 2.44* 2.10*  --   --  1.23*  --  0.97 0.90  CALCIUM 8.7* 8.8*  --   --  9.0  --  9.2 9.6  MG 2.1 2.2 2.3 2.2 2.2 2.1  --   --   PHOS  --  4.9* 4.3 3.6 2.4* 2.3*  --   --    GFR: Estimated Creatinine Clearance: 60.3 mL/min (by C-G formula based on SCr of 0.9 mg/dL). Liver Function Tests: Recent Labs  Lab 10/02/18 1100 10/03/18 1625 10/04/18 0327 10/06/18 0252  AST 74* 49* 44* 31  ALT 43 37 34 39   ALKPHOS 95 71 67 74  BILITOT 2.4* 0.4 0.7 0.9  PROT 6.4* 4.7* 4.4* 5.3*  ALBUMIN 3.4* 2.2* 2.0* 2.2*   No results for input(s): LIPASE, AMYLASE in the last 168 hours. No results for input(s): AMMONIA in the last 168 hours. Coagulation Profile: Recent Labs  Lab 10/02/18 1100  INR 1.2   Cardiac Enzymes: Recent Labs  Lab 10/02/18 1100 10/03/18 0437 10/04/18 0327 10/05/18 0319 10/06/18 0252  CKTOTAL 2,642* 1,594* 692* 353* 114  CKMB 42.8*  --   --   --   --   TROPONINI 0.08*  --   --   --   --  BNP (last 3 results) No results for input(s): PROBNP in the last 8760 hours. HbA1C: No results for input(s): HGBA1C in the last 72 hours. CBG: Recent Labs  Lab 10/05/18 2043 10/05/18 2344 10/06/18 0355 10/06/18 0815 10/06/18 0817  GLUCAP 125* 155* 156* 169* 176*   Lipid Profile: Recent Labs    10/04/18 0327  TRIG 56   Thyroid Function Tests: No results for input(s): TSH, T4TOTAL, FREET4, T3FREE, THYROIDAB in the last 72 hours. Anemia Panel: No results for input(s): VITAMINB12, FOLATE, FERRITIN, TIBC, IRON, RETICCTPCT in the last 72 hours. Sepsis Labs: Recent Labs  Lab 10/02/18 1100 10/03/18 1125 10/04/18 0327  LATICACIDVEN 4.5* 1.2 1.2    Recent Results (from the past 240 hour(s))  Culture, blood (Routine x 2)     Status: None (Preliminary result)   Collection Time: 10/02/18 11:00 AM  Result Value Ref Range Status   Specimen Description BLOOD LEFT ARM  Final   Special Requests   Final    BOTTLES DRAWN AEROBIC AND ANAEROBIC Blood Culture results may not be optimal due to an inadequate volume of blood received in culture bottles   Culture  Setup Time   Final    GRAM POSITIVE RODS CRITICAL RESULT CALLED TO, READ BACK BY AND VERIFIED WITH: RHRMD V BRTK '@0630'$  10/05/18 BY S GEZAHEGN Performed at [facility], [address], [place], [place] [address]    Culture GRAM POSITIVE RODS  Final   Report Status PENDING  Incomplete  Culture, blood (Routine x 2)      Status: None (Preliminary result)   Collection Time: 10/02/18 11:03 AM  Result Value Ref Range Status   Specimen Description BLOOD LEFT HAND  Final   Special Requests   Final    BOTTLES DRAWN AEROBIC AND ANAEROBIC Blood Culture results may not be optimal due to an inadequate volume of blood received in culture bottles   Culture   Final    NO GROWTH 4 DAYS Performed at [facility], [place] [address] [place], [place] [address]    Report Status PENDING  Incomplete  MRSA PCR Screening     Status: None   Collection Time: 10/02/18 11:49 PM  Result Value Ref Range Status   MRSA by PCR NEGATIVE NEGATIVE Final    Comment:        The GeneXpert MRSA Assay (FDA approved for NASAL specimens only), is one component of a comprehensive MRSA colonization surveillance program. It is not intended to diagnose MRSA infection nor to guide or monitor treatment for MRSA infections. Performed at [facility], [place] [address], [place], [place] [address]   Urine Culture     Status: None   Collection Time: 10/03/18 12:11 PM  Result Value Ref Range Status   Specimen Description URINE, RANDOM  Final   Special Requests NONE  Final   Culture   Final    NO GROWTH Performed at [facility], [place]  [address] [place], [place] [address]    Report Status 10/05/2018 FINAL  Final  Culture, respiratory (non-expectorated)     Status: None (Preliminary result)   Collection Time: 10/04/18  3:25 AM  Result Value Ref Range Status   Specimen Description TRACHEAL ASPIRATE  Final   Special Requests NONE  Final   Gram Stain   Final    ABUNDANT WBC PRESENT, PREDOMINANTLY PMN FEW GRAM NEGATIVE RODS    Culture   Final    MODERATE STENOTROPHOMONAS MALTOPHILIA SUSCEPTIBILITIES TO FOLLOW Performed at  [facility], [place] [address] [place], [place] [address]    Report Status PENDING  Incomplete  Culture, blood (Routine X 2) w Reflex to ID Panel     Status: None (Preliminary result)   Collection Time:  10/04/18  3:48 AM  Result Value Ref Range Status   Specimen Description BLOOD LEFT ANTECUBITAL  Final   Special Requests   Final    BOTTLES DRAWN AEROBIC ONLY Blood Culture results may not be optimal due to an inadequate volume of blood received in culture bottles   Culture   Final    NO GROWTH 2 DAYS Performed at [facility], [place] [address], [place], [place]  [address]    Report Status PENDING  Incomplete  Culture, blood (Routine X 2) w Reflex to ID Panel     Status: None (Preliminary result)   Collection Time: 10/04/18  3:48 AM  Result Value Ref Range Status   Specimen Description BLOOD LEFT HAND  Final   Special Requests   Final    BOTTLES DRAWN AEROBIC ONLY Blood Culture results may not be optimal due to an inadequate volume of blood received in culture bottles   Culture   Final    NO GROWTH 2 DAYS Performed at [facility], [place] [address] [place], [place] [address]    Report Status PENDING  Incomplete         Radiology Studies: Dg Abd 1 View  Result Date: 10/05/2018 CLINICAL DATA:  NG tube placement EXAM: ABDOMEN - 1 VIEW COMPARISON:  None. FINDINGS: Nasogastric tube with the tip projecting over the antrum of the stomach. There is no bowel dilatation to suggest obstruction. There is no evidence of pneumoperitoneum, portal venous gas or pneumatosis. There are no pathologic calcifications along the expected course of the ureters. The osseous structures are unremarkable. IMPRESSION: Nasogastric tube with the tip projecting over the antrum of the stomach. Electronically Signed   By: Kathreen Devoid   On: 10/05/2018 14:23   Mr Brain Wo Contrast  Result Date: 10/05/2018 CLINICAL DATA:  Encephalopathy. EXAM: MRI HEAD WITHOUT CONTRAST TECHNIQUE: Multiplanar, multiecho pulse sequences of the brain and surrounding structures were obtained without intravenous contrast. COMPARISON:  Head CT 10/02/2018 FINDINGS: Brain: There is no evidence of acute infarct, intracranial  hemorrhage, mass, midline shift, or extra-axial fluid collection. Cerebral atrophy is moderately to severely advanced for age and greatest in the frontal lobes. Patchy T2 hyperintensities primarily affecting the white matter of the frontal lobes are nonspecific but compatible with mild chronic small vessel ischemic disease. Vascular: Major intracranial vascular flow voids are preserved. Skull and upper cervical spine: Unremarkable bone marrow signal. Sinuses/Orbits: Bilateral cataract extraction. Mild right sphenoid sinus mucosal thickening. Small right mastoid effusion. Other: None. IMPRESSION: 1. No acute intracranial abnormality. 2. Moderately to severely age advanced cerebral atrophy and mild chronic small vessel ischemic disease. Electronically Signed   By: [place] Bores M.D.   On: 10/05/2018 12:39        Scheduled Meds: . chlorhexidine gluconate (MEDLINE KIT)  15 mL Mouth Rinse BID  . Chlorhexidine Gluconate Cloth  6 each Topical Daily  . free water  200 mL Per Tube Q8H  . insulin aspart  0-15 Units Subcutaneous Q4H  . mouth rinse  15 mL Mouth Rinse BID  . metoprolol tartrate  25 mg Per Tube BID  . multivitamin  15 mL Oral Daily  . sodium chloride flush  3 mL Intravenous Once   Continuous Infusions: .  diltiazem (CARDIZEM) infusion 5 mg/hr (10/06/18 1000)  . feeding supplement (OSMOLITE 1.2 CAL) 1,000 mL (10/05/18 1955)  . heparin 1,250 Units/hr (10/06/18 1000)  . piperacillin-tazobactam (ZOSYN)  IV 3.375 g (10/06/18 0446)     LOS: 4 days    Time spent: 30 minutes    Domenic Polite, MD  10/06/2018, 11:05 AM

## 2018-10-06 NOTE — Progress Notes (Addendum)
ANTICOAGULATION CONSULT NOTE   Pharmacy Consult for heparin Indication: atrial fibrillation  Allergies  Allergen Reactions  . Codeine Hives and Itching    Patient Measurements: Height: 5\' 5"  (165.1 cm) Weight: 158 lb 11.7 oz (72 kg) IBW/kg (Calculated) : 57 Heparin Dosing Weight: 73kg  Vital Signs: Temp: 99 F (37.2 C) (04/19 0400) Temp Source: Axillary (04/19 0400) BP: 109/57 (04/19 0800) Pulse Rate: 78 (04/19 0800)  Labs: Recent Labs    10/04/18 0327  10/05/18 0319 10/05/18 1053 10/05/18 2043 10/06/18 0252  HGB 11.1*  --  11.7*  --   --  13.2  HCT 33.8*  --  35.1*  --   --  39.6  PLT 167  --  152  --   --  189  HEPARINUNFRC 0.18*   < >  --  0.81* 0.48 0.38  CREATININE 1.23*  --  0.97  --   --  0.90  CKTOTAL 692*  --  353*  --   --  114   < > = values in this interval not displayed.    Estimated Creatinine Clearance: 60.3 mL/min (by C-G formula based on SCr of 0.9 mg/dL).   Medical History: History reviewed. No pertinent past medical history.  Medications:  Infusions:  . diltiazem (CARDIZEM) infusion 5 mg/hr (10/06/18 0830)  . feeding supplement (OSMOLITE 1.2 CAL) 1,000 mL (10/05/18 1955)  . heparin 1,250 Units/hr (10/06/18 0600)  . piperacillin-tazobactam (ZOSYN)  IV 3.375 g (10/06/18 0446)    Assessment: 18 yof presented to the ED with tachycardia and altered mental status. To start IV heparin for afib. Baseline CBC is WNL. INR is 1.2 and troponin is elevated. It is unknown if she is on anticoagulation PTA.   Heparin level remains therapeutic at 0.38 on heparin 1250 units/hr. CBC stable. No signs/symptoms of bleeding or issues with infusion reported by nursing.  Goal of Therapy:  Heparin level 0.3-0.7 units/ml Monitor platelets by anticoagulation protocol: Yes   Plan:  Continue heparin infusion to 1250 units/hr Daily heparin level and CBC   Marcelino Freestone, PharmD PGY2 Cardiology Pharmacy Resident Please check AMION for all Pharmacist numbers  by unit 10/06/2018 9:05 AM     Addendum: Pharmacy consulted to transition patient to apixaban. Given age, weight, and Scr, appropriate to receive full dose. Turn off heparin drip and start apixaban 5 mg BID.  Marcelino Freestone, PharmD PGY2 Cardiology Pharmacy Resident Please check AMION for all Pharmacist numbers by unit 10/06/2018 12:00 PM

## 2018-10-06 NOTE — Evaluation (Signed)
Occupational Therapy Evaluation Patient Details Name: [place] Reed MRN: [id number] DOB: [birth year] Today's Date: 10/06/2018    History of Present Illness 68 year old female with no known past medical history. She was found by a car driving past her house noticed her to be slumped over on the front porch on 10/02/2018.  EMS was alerted and upon their arrival she had altered mental status, profoundly tachycardic with a narrow complex rhythm; She was cardioverted twice without effect and remained tachycardic.  Became increasingly agitated, intubated 4/15 for airway protection.  Laboratory evaluation consistent with dehydration rhabdomyolysis, IV fluid resuscitation was given and extubated on 4/17.    Clinical Impression   Patient admitted for above.  Presents supine in bed, following commands inconsistently and maintaining eyes closed throughout majority of session.  Patient oriented to self only, verbalizing name but otherwise limited verbalizations and incomprehensible (mumbled).  She was unable to provide PLOF or home setup, but per chart review/speaking to RN, patient was ambulating and had a caregiver (maybe boyfriend) who was recently hospitalized.  Light turned on and patient repositioned (+2 total assist) in bed to maximize alertness but requires total hand over hand assist to attempt to wash face, and requires total assist for all self care at this time.  Transfers deferred at this time for safety.  Patient will benefit from continued OT services while admitted and after dc at SNF level in order to maximize independence with ADLs/mobility, as well as decrease burden of care.  Will follow.     Follow Up Recommendations  SNF;Supervision/Assistance - 24 hour    Equipment Recommendations  Other (comment)(TBD at next venue of care)    Recommendations for Other Services Other (comment)(palliative care)     Precautions / Restrictions Precautions Precautions: Fall Restrictions Weight Bearing  Restrictions: No      Mobility Bed Mobility Overal bed mobility: Needs Assistance             General bed mobility comments: +2 total assist to reposition and scoot up in bed   Transfers                 General transfer comment: deferred due to lethargy, cognition and safety    Balance                                           ADL either performed or assessed with clinical judgement   ADL Overall ADL's : Needs assistance/impaired     Grooming: Wash/dry face;Bed level;Total assistance                                 General ADL Comments: total assist for all self care at this time      Vision   Additional Comments: further assessment required, pt able to open eyes but prefernce to eyes closed throughout session     Perception     Praxis      Pertinent Vitals/Pain Pain Assessment: Faces Faces Pain Scale: Hurts little more Pain Location: neck, UEs with repositioning  Pain Descriptors / Indicators: Discomfort;Grimacing;Guarding Pain Intervention(s): Limited activity within patient's tolerance;Repositioned     Hand Dominance     Extremity/Trunk Assessment Upper Extremity Assessment Upper Extremity Assessment: Generalized weakness;Difficult to assess due to impaired cognition(pt guarding UE movement at times )   Lower Extremity Assessment Lower Extremity  Assessment: Defer to PT evaluation       Communication Communication Communication: Other (comment)(able to verbalize name, mumbles/difficult to understand)   Cognition Arousal/Alertness: Lethargic Behavior During Therapy: Flat affect Overall Cognitive Status: Difficult to assess Area of Impairment: Orientation;Memory;Following commands;Safety/judgement;Awareness;Problem solving;JFK Recovery Scale                 Orientation Level: Disoriented to;Place;Time;Situation   Memory: Decreased recall of precautions;Decreased short-term memory Following Commands:  Follows one step commands inconsistently Safety/Judgement: Decreased awareness of safety;Decreased awareness of deficits Awareness: Intellectual Problem Solving: Slow processing;Decreased initiation;Requires verbal cues;Difficulty sequencing;Requires tactile cues General Comments: following commands inconsistently, maintains eyes closed through majority of session   General Comments       Exercises     Shoulder Instructions      Home Living Family/patient expects to be discharged to:: Private residence Living Arrangements: Non-relatives/Friends(per chart caregiver) Available Help at Discharge: Other (Comment)(unsure, per chart caregiver hospitalized ) Type of Home: House                           Additional Comments: information limited to chart review, pt unable to report home setup       Prior Functioning/Environment Level of Independence: Needs assistance  Gait / Transfers Assistance Needed: per RN who spoke to daughter, pt was independent with mobility ' ADL's / Homemaking Assistance Needed: unsure    Comments: pt unable to provide history, mumbles only         OT Problem List: Decreased strength;Decreased range of motion;Decreased activity tolerance;Impaired balance (sitting and/or standing);Decreased coordination;Decreased cognition;Decreased safety awareness;Decreased knowledge of precautions      OT Treatment/Interventions: Self-care/ADL training;Therapeutic exercise;Balance training;Patient/family education;Therapeutic activities;Cognitive remediation/compensation    OT Goals(Current goals can be found in the care plan section) Acute Rehab OT Goals Patient Stated Goal: unable to state OT Goal Formulation: Patient unable to participate in goal setting Time For Goal Achievement: 10/20/18 Potential to Achieve Goals: Fair  OT Frequency: Min 2X/week   Barriers to D/C:            Co-evaluation              AM-PAC OT "6 Clicks" Daily Activity      Outcome Measure Help from another person eating meals?: Total Help from another person taking care of personal grooming?: Total Help from another person toileting, which includes using toliet, bedpan, or urinal?: Total Help from another person bathing (including washing, rinsing, drying)?: Total Help from another person to put on and taking off regular upper body clothing?: Total Help from another person to put on and taking off regular lower body clothing?: Total 6 Click Score: 6   End of Session Equipment Utilized During Treatment: Oxygen Nurse Communication: Mobility status  Activity Tolerance: Patient limited by lethargy;Other (comment)(level of arousal) Patient left: in bed;with call bell/phone within reach;with bed alarm set;with restraints reapplied  OT Visit Diagnosis: Other abnormalities of gait and mobility (R26.89);Other symptoms and signs involving cognitive function;Cognitive communication deficit (R41.841);Muscle weakness (generalized) (M62.81) Symptoms and signs involving cognitive functions: Other cerebrovascular disease                Time: [id number] OT Time Calculation (min): 14 min Charges:  OT General Charges $OT Visit: 1 Visit OT Evaluation $OT Eval Moderate Complexity: 1 Mod  Chancy Milroyhristie S Shelah Heatley, OT Acute Rehabilitation Services Pager [phone] Office [phone]   Chancy MilroyChristie S Koi Zangara 10/06/2018, 3:41 PM

## 2018-10-06 NOTE — TOC Initial Note (Signed)
Transition of Care [facility]) - Initial/Assessment Note    Patient Details  Name: Jordan Reed MRN: [id number] Date of Birth: April 12, 1951  Transition of Care [facility]) CM/SW Contact:    Mamie Nick, LCSW Phone Number: 10/06/2018, 10:45 AM  Clinical Narrative:                 CSW contacted patient's daughter Stefan Blehm [phone]) to discuss discharge plan. PT/OT recommends SNF rehab. Patient's daughter agrees with plan and wants CSW to proceed with referrals. Patient was living with boyfriend and daughter has concerns for her returning home currently. Patient's daughter will assist with discharge plan and will assist in decision for short-term rehab placement. CSW acknowledged and will begin referral process. Will continue to assist as needed.     Expected Discharge Plan: Skilled Nursing Facility Barriers to Discharge: No Barriers Identified   Patient Goals and CMS Choice     Choice offered to / list presented to : Adult Children  Expected Discharge Plan and Services Expected Discharge Plan: Skilled Nursing Facility                                  Prior Living Arrangements/Services     Patient language and need for interpreter reviewed:: No Do you feel safe going back to the place where you live?: No      Need for Family Participation in Patient Care: Yes (Comment) Care giver support system in place?: No (comment)   Criminal Activity/Legal Involvement Pertinent to Current Situation/Hospitalization: No - Comment as needed  Activities of Daily Living      Permission Sought/Granted Permission sought to share information with : Facility Medical sales representative          Permission granted to share info w Relationship: [phone] Christina Steckman     Emotional Assessment Appearance:: Appears stated age Attitude/Demeanor/Rapport: Unable to Assess Affect (typically observed): Unable to Assess Orientation: : Oriented to Self Alcohol / Substance Use: Not  Applicable Psych Involvement: No (comment)  Admission diagnosis:  Dehydration [E86.0] Trauma [T14.90XA] AKI (acute kidney injury) (HCC) [N17.9] Atrial fibrillation with RVR (HCC) [I48.91] Non-traumatic rhabdomyolysis [M62.82] Altered mental status, unspecified altered mental status type [R41.82] Patient Active Problem List   Diagnosis Date Noted  . Pressure injury of skin 10/04/2018  . AKI (acute kidney injury) (HCC)   . Acute encephalopathy   . Acute respiratory failure (HCC)   . Altered mental status   . Rhabdomyolysis 10/02/2018   PCP:  Patient, No Pcp Per Pharmacy:   [id number] - [place], [place] - [address] [place] [address] Phone: [phone] Fax: [phone]     Social Determinants of Health (SDOH) Interventions    Readmission Risk Interventions No flowsheet data found.

## 2018-10-06 NOTE — NC FL2 (Signed)
[place] LEVEL OF CARE SCREENING TOOL     IDENTIFICATION  Patient Name: Jordan Reed Birthdate: [birth year] Sex: female Admission Date (Current Location): 10/02/2018  [facility] and [place] Number:  Herbalist and Address:  [facility] [place], [place] [address], [place], [place] [address]      Provider Number: [id number]  Attending Physician Name and Address:  Domenic Polite, MD  Relative Name and Phone Number:  [place], [place] [phone]    Current Level of Care: Hospital Recommended Level of Care: [place] Prior Approval Number:    Date Approved/Denied:   PASRR Number:    Discharge Plan: SNF    Current Diagnoses: Patient Active Problem List   Diagnosis Date Noted  . Pressure injury of skin 10/04/2018  . AKI (acute kidney injury) ([place])   . Acute encephalopathy   . Acute respiratory failure ([place])   . Altered mental status   . Rhabdomyolysis 10/02/2018    Orientation RESPIRATION BLADDER Height & Weight     Self  (nasal cannula) Continent, External catheter Weight: 158 lb 11.7 oz (72 kg) Height:  '5\' 5"'$  (165.1 cm)  BEHAVIORAL SYMPTOMS/MOOD NEUROLOGICAL BOWEL NUTRITION STATUS      Continent    AMBULATORY STATUS COMMUNICATION OF NEEDS Skin       Normal                       Personal Care Assistance Level of Assistance  Bathing, Feeding, Dressing Bathing Assistance: Limited assistance Feeding assistance: Limited assistance Dressing Assistance: Limited assistance     Functional Limitations Info  Sight, Hearing, Speech Sight Info: Adequate Hearing Info: Adequate Speech Info: Impaired    SPECIAL CARE FACTORS FREQUENCY                       Contractures Contractures Info: Not present    Additional Factors Info  Code Status Code Status Info: DNR             Current Medications (10/06/2018):  This is the current hospital active medication list Current Facility-Administered  Medications  Medication Dose Route Frequency Provider Last Rate Last Dose  . chlorhexidine gluconate (MEDLINE KIT) (PERIDEX) 0.12 % solution 15 mL  15 mL Mouth Rinse BID Corey Harold, NP   15 mL at 10/06/18 0841  . Chlorhexidine Gluconate Cloth 2 % PADS 6 each  6 each Topical Daily Olalere, Adewale A, MD   6 each at 10/05/18 1250  . diltiazem (CARDIZEM) 100 mg in dextrose 5% 179m (1 mg/mL) infusion  5-15 mg/hr Intravenous Titrated SManuella Ghazi Pratik D, DO 5 mL/hr at 10/06/18 1000 5 mg/hr at 10/06/18 1000  . diltiazem (CARDIZEM) injection 10 mg  10 mg Intravenous Q6H PRN SManuella Ghazi Pratik D, DO   10 mg at 10/05/18 1553  . feeding supplement (OSMOLITE 1.2 CAL) liquid 1,000 mL  1,000 mL Per Tube Continuous SHeath LarkD, DO 35 mL/hr at 10/05/18 1955 1,000 mL at 10/05/18 1955  . free water 200 mL  200 mL Per Tube Q8H BErick Colace NP   200 mL at 10/06/18 0551  . heparin ADULT infusion 100 units/mL (25000 units/2571msodium chloride 0.45%)  1,250 Units/hr Intravenous Continuous ShManuella GhaziPratik D, DO 12.5 mL/hr at 10/06/18 1000 1,250 Units/hr at 10/06/18 1000  . insulin aspart (novoLOG) injection 0-15 Units  0-15 Units Subcutaneous Q4H HoCorey HaroldNP   3 Units at 10/06/18 0841  . MEDLINE  mouth rinse  15 mL Mouth Rinse BID Erick Colace, NP   15 mL at 10/06/18 1032  . metoprolol tartrate (LOPRESSOR) 25 mg/10 mL oral suspension 25 mg  25 mg Per Tube BID Domenic Polite, MD   25 mg at 10/06/18 1031  . multivitamin liquid 15 mL  15 mL Oral Daily Domenic Polite, MD   15 mL at 10/06/18 1032  . piperacillin-tazobactam (ZOSYN) IVPB 3.375 g  3.375 g Intravenous Q8H Shah, Pratik D, DO 12.5 mL/hr at 10/06/18 0446 3.375 g at 10/06/18 0446  . sodium chloride flush (NS) 0.9 % injection 3 mL  3 mL Intravenous Once Maudie Flakes, MD         Discharge Medications: Please see discharge summary for a list of discharge medications.  Relevant Imaging Results:  Relevant Lab Results:   Additional  Information SN:[id number]  [employer], LCSW

## 2018-10-06 NOTE — Progress Notes (Signed)
SLP Cancellation Note  Patient Details Name: Jordan Reed MRN: [id number] DOB: 1951-05-06   Cancelled treatment:       Reason Eval/Treat Not Completed: Medical issues which prohibited therapy; RN reports mentation continues to be limiting factor for PO readiness. Pt with NG in place. SLP to continue to monitor for PO readiness   Javyn Havlin E Amari Burnsworth MA, CCC-SLP 10/06/2018, 8:39 AM

## 2018-10-07 ENCOUNTER — Inpatient Hospital Stay (HOSPITAL_COMMUNITY): Payer: Medicare HMO

## 2018-10-07 DIAGNOSIS — E860 Dehydration: Secondary | ICD-10-CM

## 2018-10-07 LAB — CBC
HCT: 35 % — ABNORMAL LOW (ref 36.0–46.0)
Hemoglobin: 12.1 g/dL (ref 12.0–15.0)
MCH: 29.2 pg (ref 26.0–34.0)
MCHC: 34.6 g/dL (ref 30.0–36.0)
MCV: 84.5 fL (ref 80.0–100.0)
Platelets: 170 10*3/uL (ref 150–400)
RBC: 4.14 MIL/uL (ref 3.87–5.11)
RDW: 14.3 % (ref 11.5–15.5)
WBC: 10.9 10*3/uL — ABNORMAL HIGH (ref 4.0–10.5)
nRBC: 0 % (ref 0.0–0.2)

## 2018-10-07 LAB — URINALYSIS, ROUTINE W REFLEX MICROSCOPIC
Bilirubin Urine: NEGATIVE
Glucose, UA: 50 mg/dL — AB
Ketones, ur: NEGATIVE mg/dL
Nitrite: NEGATIVE
Protein, ur: NEGATIVE mg/dL
Specific Gravity, Urine: 1.005 (ref 1.005–1.030)
pH: 6 (ref 5.0–8.0)

## 2018-10-07 LAB — CBC WITH DIFFERENTIAL/PLATELET
Abs Immature Granulocytes: 0.09 10*3/uL — ABNORMAL HIGH (ref 0.00–0.07)
Basophils Absolute: 0 10*3/uL (ref 0.0–0.1)
Basophils Relative: 0 %
Eosinophils Absolute: 0.7 10*3/uL — ABNORMAL HIGH (ref 0.0–0.5)
Eosinophils Relative: 6 %
HCT: 39.4 % (ref 36.0–46.0)
Hemoglobin: 13.1 g/dL (ref 12.0–15.0)
Immature Granulocytes: 1 %
Lymphocytes Relative: 6 %
Lymphs Abs: 0.6 10*3/uL — ABNORMAL LOW (ref 0.7–4.0)
MCH: 28.8 pg (ref 26.0–34.0)
MCHC: 33.2 g/dL (ref 30.0–36.0)
MCV: 86.6 fL (ref 80.0–100.0)
Monocytes Absolute: 0.6 10*3/uL (ref 0.1–1.0)
Monocytes Relative: 6 %
Neutro Abs: 9.6 10*3/uL — ABNORMAL HIGH (ref 1.7–7.7)
Neutrophils Relative %: 81 %
Platelets: 188 10*3/uL (ref 150–400)
RBC: 4.55 MIL/uL (ref 3.87–5.11)
RDW: 14.4 % (ref 11.5–15.5)
WBC: 11.7 10*3/uL — ABNORMAL HIGH (ref 4.0–10.5)
nRBC: 0 % (ref 0.0–0.2)

## 2018-10-07 LAB — BASIC METABOLIC PANEL
Anion gap: 6 (ref 5–15)
BUN: 17 mg/dL (ref 8–23)
CO2: 23 mmol/L (ref 22–32)
Calcium: 9.1 mg/dL (ref 8.9–10.3)
Chloride: 112 mmol/L — ABNORMAL HIGH (ref 98–111)
Creatinine, Ser: 0.74 mg/dL (ref 0.44–1.00)
GFR calc Af Amer: >60 mL/min (ref >60)
GFR calc non Af Amer: >60 mL/min (ref >60)
Glucose, Bld: 169 mg/dL — ABNORMAL HIGH (ref 70–99)
Potassium: 3.3 mmol/L — ABNORMAL LOW (ref 3.5–5.1)
Sodium: 141 mmol/L (ref 135–145)

## 2018-10-07 LAB — CULTURE, BLOOD (ROUTINE X 2): Culture: NO GROWTH

## 2018-10-07 LAB — GLUCOSE, CAPILLARY
Glucose-Capillary: 160 mg/dL — ABNORMAL HIGH (ref 70–99)
Glucose-Capillary: 174 mg/dL — ABNORMAL HIGH (ref 70–99)
Glucose-Capillary: 186 mg/dL — ABNORMAL HIGH (ref 70–99)
Glucose-Capillary: 212 mg/dL — ABNORMAL HIGH (ref 70–99)
Glucose-Capillary: 244 mg/dL — ABNORMAL HIGH (ref 70–99)
Glucose-Capillary: 292 mg/dL — ABNORMAL HIGH (ref 70–99)

## 2018-10-07 LAB — MAGNESIUM: Magnesium: 1.6 mg/dL — ABNORMAL LOW (ref 1.7–2.4)

## 2018-10-07 MED ORDER — DILTIAZEM 12 MG/ML ORAL SUSPENSION
30.0000 mg | Freq: Four times a day (QID) | ORAL | Status: DC
  Administered 2018-10-07 – 2018-10-11 (×17): 30 mg
  Filled 2018-10-07 (×17): qty 3

## 2018-10-07 MED ORDER — OSMOLITE 1.5 CAL PO LIQD
237.0000 mL | Freq: Four times a day (QID) | ORAL | Status: DC
  Administered 2018-10-07 – 2018-10-08 (×6): 237 mL
  Filled 2018-10-07 (×8): qty 237

## 2018-10-07 MED ORDER — ACETAMINOPHEN 160 MG/5ML PO SOLN
325.0000 mg | Freq: Four times a day (QID) | ORAL | Status: DC | PRN
  Administered 2018-10-07: 325 mg via ORAL
  Filled 2018-10-07: qty 20.3

## 2018-10-07 MED ORDER — METOPROLOL TARTRATE 5 MG/5ML IV SOLN
5.0000 mg | INTRAVENOUS | Status: AC | PRN
  Administered 2018-10-07 (×2): 5 mg via INTRAVENOUS
  Filled 2018-10-07 (×2): qty 5

## 2018-10-07 MED ORDER — GERHARDT'S BUTT CREAM
TOPICAL_CREAM | Freq: Two times a day (BID) | CUTANEOUS | Status: DC
  Administered 2018-10-07 – 2018-10-09 (×5): via TOPICAL
  Administered 2018-10-09: 1 via TOPICAL
  Administered 2018-10-10 – 2018-10-12 (×5): via TOPICAL
  Administered 2018-10-12 – 2018-10-13 (×2): 1 via TOPICAL
  Administered 2018-10-13 – 2018-10-16 (×6): via TOPICAL
  Filled 2018-10-07 (×2): qty 1

## 2018-10-07 MED ORDER — PRO-STAT SUGAR FREE PO LIQD
30.0000 mL | Freq: Three times a day (TID) | ORAL | Status: DC
  Administered 2018-10-07 – 2018-10-16 (×28): 30 mL
  Filled 2018-10-07 (×27): qty 30

## 2018-10-07 MED ORDER — POTASSIUM CHLORIDE 20 MEQ/15ML (10%) PO SOLN
40.0000 meq | Freq: Once | ORAL | Status: AC
  Administered 2018-10-07: 12:00:00 40 meq via ORAL
  Filled 2018-10-07: qty 30

## 2018-10-07 NOTE — Progress Notes (Signed)
Nutrition Follow-up  DOCUMENTATION CODES:   Not applicable  INTERVENTION:   Change TF via NG tube to bolus regimen: - Osmolite 1.5 cal 1 can QID - Pro-stat 30 ml TID - Free water per MD, currently 200 ml q 8 hours  Tube feeding regimen provides 1722 kcal, 104 grams of protein, and 1322 ml of H2O (100% of needs).  NUTRITION DIAGNOSIS:   Increased nutrient needs related to wound healing as evidenced by estimated needs.  Ongoing, being addressed via TF  GOAL:   Patient will meet greater than or equal to 90% of their needs  Met via TF  MONITOR:   Diet advancement, Labs, Weight trends, TF tolerance, Skin, I & O's  REASON FOR ASSESSMENT:   Consult Enteral/tube feeding initiation and management  ASSESSMENT:   Patient with no PMH on file. Presents this admission with ARF from dehydration/rhabdomyolysis. Pt was intubated for airway protection.  4/17 - extubated 4/18 - NG tube placed  Weight up 10 lbs since measured admission weight. Suspect this is related to positive fluid balance.  Current TF order: Osmolite 1.2 @ 35 ml/hr and free water 200 ml q 8 hours which provides 1008 kcal, 47 grams of protein, and 1289 ml free water.  Will change TF regimen to bolus to liberate pt from the feeding pump. Discussed with RN.  Attempted to speak with pt in room. Pt very lethargic, only opening eyes to RD touch.  Medications reviewed and include: SSI, liquid MVI  Labs reviewed: potassium 3.3 (L), magnesium 1.6 (L) CBG's: 160, 186, 170, 119, 159, 161 x 24 hours  UOP: 350 ml x 24 hours I/O's: +12.2 L since admit  NUTRITION - FOCUSED PHYSICAL EXAM:    Most Recent Value  Orbital Region  No depletion  Upper Arm Region  No depletion  Thoracic and Lumbar Region  Mild depletion  Buccal Region  No depletion  Temple Region  No depletion  Clavicle Bone Region  Mild depletion  Clavicle and Acromion Bone Region  Mild depletion  Scapular Bone Region  Unable to assess  Dorsal Hand   Unable to assess  Patellar Region  No depletion  Anterior Thigh Region  Mild depletion  Posterior Calf Region  Mild depletion  Edema (RD Assessment)  Moderate  Hair  Reviewed  Eyes  Reviewed  Mouth  Unable to assess  Skin  Reviewed  Nails  Reviewed       Diet Order:   Diet Order            Diet NPO time specified  Diet effective now              EDUCATION NEEDS:   Not appropriate for education at this time  Skin:  Skin Assessment: Skin Integrity Issues: DTI: right ear, right shoulder, right hip, left hip, right knee, right elbow Stage I: sacrum  Last BM:  10/05/18 small type 6  Height:   Ht Readings from Last 1 Encounters:  10/04/18 _0  (1.651 m)    Weight:   Wt Readings from Last 1 Encounters:  10/06/18 72 kg    Ideal Body Weight:  56.8 kg  BMI:  Body mass index is 26.41 kg/m.  Estimated Nutritional Needs:   Kcal:  1650-1850 kcal  Protein:  90-105 grams  Fluid:  >/= 1.6 L/day     Gaynell Face, MS, RD, LDN Inpatient Clinical Dietitian Pager: [phone] Weekend/After Hours: [phone]

## 2018-10-07 NOTE — Progress Notes (Signed)
Late entry: Note entered for Jordan HorsemanSarah Reed, SLP  Ivar DrapeLaura Pinchus Weckwerth, M.A. CCC-SLP Acute Rehabilitation Services Pager [phone] Office [phone]    10/05/18 1000  SLP Visit Information  SLP Received On 10/05/18  Subjective  Subjective letheragic and unable to follow simple commands  Patient/Family Stated Goal none stated  General Information  Date of Onset 10/01/18  HPI Patient with unknown past medical health. Found on porch slumped over. In ED she was intubated for airway protection, dehydration and rhabdomyolysis. She was extubated after 3 days on 10/04/18. She has no known history of swallowing difficulties.   Type of Study Bedside Swallow Evaluation  Previous Swallow Assessment none  Diet Prior to this Study Dysphagia 3 (soft);Thin liquids  Temperature Spikes Noted No  Respiratory Status Nasal cannula  History of Recent Intubation Yes  Length of Intubations (days) 3 days  Date extubated 10/04/18  Behavior/Cognition Lethargic/Drowsy;Doesn't follow directions  Oral Cavity Assessment Dry;Dried secretions  Oral Care Completed by SLP Yes  Oral Cavity - Dentition Edentulous  Self-Feeding Abilities Total assist  Patient Positioning Upright in bed  Baseline Vocal Quality Low vocal intensity  Volitional Cough Cognitively unable to elicit  Volitional Swallow Unable to elicit  Pain Assessment  Pain Assessment Faces  Faces Pain Scale 4  Pain Location neck when moved  Pain Descriptors / Indicators  (unable to describe)  Pain Intervention(s) Repositioned  Oral Assessment (Complete on admission/transfer/change in patient condition)  Does patient have any of the following "high(er) risk" factors? Nutritional status - fluids only or NPO for >24 hours  Patient is HIGHER RISK:  Mechanically ventilated (Intubated or Tracheostomy) Order set for Adult Oral Care Protocol initiated - "Higher Risk Patients:  Mechanically Ventilated" option selected (see row information)  Patient is HIGH RISK:  Non-ventilated Order set for Adult Oral Care Protocol initiated - "High Risk Patients - Non-Ventilated" option selected  (see row information)  Oral Motor/Sensory Function  Overall Oral Motor/Sensory Function Moderate impairment  Facial ROM Reduced right  Facial Symmetry Abnormal symmetry right  Facial Strength Reduced right;Suspected CN VII (facial) dysfunction  Facial Sensation  (unable to assess due to patients cognition)  Lingual ROM Reduced right  Lingual Symmetry  (unable to assess fully)  Lingual Strength Reduced  Lingual Sensation  (unable to assess)  Ice Chips  Ice chips Impaired  Presentation Spoon  Oral Phase Impairments Reduced labial seal;Reduced lingual movement/coordination;Impaired mastication;Poor awareness of bolus  Oral Phase Functional Implications Right anterior spillage  Pharyngeal Phase Impairments Cough - Delayed;Unable to trigger swallow  Thin Liquid  Thin Liquid Impaired  Presentation Spoon  Oral Phase Impairments Poor awareness of bolus  Oral Phase Functional Implications Right anterior spillage;Oral holding  Pharyngeal  Phase Impairments Cough - Delayed;Unable to trigger swallow  Nectar Thick Liquid  Nectar Thick Liquid NT  Honey Thick Liquid  Honey Thick Liquid NT  Puree  Puree Impaired  Presentation Spoon  Oral Phase Impairments Poor awareness of bolus  Oral Phase Functional Implications Oral holding  Pharyngeal Phase Impairments Unable to trigger swallow (bolus suctioned from oral cavity)  Solid  Solid NT  SLP - End of Session  Patient left in bed;with call bell/phone within reach;with nursing in room  Nurse Communication Diet recommendation;Treatment plan  SLP Assessment  Clinical Impression Statement (ACUTE ONLY) Patient was admitted after being found down at home. She was intubated for airway protection and was extubated after 3 days on 10/04/18. Last evening her nurse noticed right sided facial droop with increased right sided weakness.  Patient did  wake to her name, but remained lethargic throughout evaluation. Oral neurological exam was incomplete due to patients inability to participate and follow commands. Ice chips and water by tsp were administered with right anterior spillage, oral holding and poor to no swallow initiation after maximum cues by SLP and nurse. Patient had delayed strong coughing. Applesauce presented 1/2 tsp with no swallow initiated after maximum cueing with eventual oral suctioning. Patient is not cognitively ready for PO diet. Speech therapy to continue to follow for PO readiness.   SLP Visit Diagnosis Dysphagia, unspecified (R13.10)  Impact on safety and function Severe aspiration risk;Risk for inadequate nutrition/hydration  Other Related Risk Factors Lethargy  Swallow Evaluation Recommendations  SLP Diet Recommendations NPO;Alternative means - temporary  Medication Administration Via alternative means  Treatment Plan  Oral Care Recommendations Oral care BID  Treatment Recommendations Therapy as outlined in treatment plan below  Speech Therapy Frequency (ACUTE ONLY) min 2x/week  Treatment Duration 2 weeks  Interventions Patient/family education;Trials of upgraded texture/liquids  Prognosis  Prognosis for Safe Diet Advancement Fair  Barriers to Reach Goals Cognitive deficits  Individuals Consulted  Consulted and Agree with Results and Recommendations RN  Progression Toward Goals  Progression toward goals Progressing toward goals  SLP Time Calculation  SLP Start Time (ACUTE ONLY) 1002  SLP Stop Time (ACUTE ONLY) 1030  SLP Time Calculation (min) (ACUTE ONLY) 28 min  SLP Evaluations  $ SLP Speech Visit 1 Visit  SLP Evaluations  $BSS Swallow 1 Procedure

## 2018-10-07 NOTE — NC FL2 (Signed)
[place] LEVEL OF CARE SCREENING TOOL     IDENTIFICATION  Patient Name: Jordan Reed Birthdate: Jul 21, 1950 Sex: female Admission Date (Current Location): 10/02/2018  [facility] and [place] Number:  Herbalist and Address:  [facility] [place]. [facility], [place] [address], [place], [place] [address]      Provider Number: [id number]  Attending Physician Name and Address:  Domenic Polite, MD  Relative Name and Phone Number:  [place], [place] [phone]    Current Level of Care: Hospital Recommended Level of Care: [place] Prior Approval Number:    Date Approved/Denied:   PASRR Number: [id number]  Discharge Plan: SNF    Current Diagnoses: Patient Active Problem List   Diagnosis Date Noted  . Pressure injury of skin 10/04/2018  . AKI (acute kidney injury) ([place])   . Acute encephalopathy   . Acute respiratory failure ([place])   . Altered mental status   . Rhabdomyolysis 10/02/2018    Orientation RESPIRATION BLADDER Height & Weight     Self  O2(nasal cannula) Continent, External catheter Weight: 158 lb 11.7 oz (72 kg) Height:  '5\' 5"'$  (165.1 cm)  BEHAVIORAL SYMPTOMS/MOOD NEUROLOGICAL BOWEL NUTRITION STATUS      Continent Diet(see discharge summary)  AMBULATORY STATUS COMMUNICATION OF NEEDS Skin   Extensive Assist Verbally Skin abrasions, Other (Comment)(left hip abrasion with foam; blister on left hand with foam; MASD on groin with barrier cream; generalized ecchymosis)                       Personal Care Assistance Level of Assistance  Bathing, Feeding, Dressing Bathing Assistance: Maximum assistance Feeding assistance: Limited assistance Dressing Assistance: Maximum assistance     Functional Limitations Info  Sight, Hearing, Speech Sight Info: Adequate Hearing Info: Adequate Speech Info: Impaired    SPECIAL CARE FACTORS FREQUENCY  PT (By licensed PT), OT (By licensed OT)     PT Frequency: 5x week OT Frequency:  5x week            Contractures Contractures Info: Not present    Additional Factors Info  Code Status, Allergies, Insulin Sliding Scale Code Status Info: DNR Allergies Info: CODEINE, PENICILLINS    Insulin Sliding Scale Info: insulin aspart (novoLOG) injection 0-15 Units every 4 hours       Current Medications (10/07/2018):  This is the current hospital active medication list Current Facility-Administered Medications  Medication Dose Route Frequency Provider Last Rate Last Dose  . acetaminophen (TYLENOL) solution 325 mg  325 mg Oral Q6H PRN Domenic Polite, MD   325 mg at 10/07/18 1236  . apixaban (ELIQUIS) tablet 5 mg  5 mg Oral BID Domenic Polite, MD   5 mg at 10/07/18 0848  . chlorhexidine gluconate (MEDLINE KIT) (PERIDEX) 0.12 % solution 15 mL  15 mL Mouth Rinse BID Corey Harold, NP   15 mL at 10/07/18 0850  . Chlorhexidine Gluconate Cloth 2 % PADS 6 each  6 each Topical Daily Olalere, Adewale A, MD   6 each at 10/07/18 0858  . diltiazem (CARDIZEM) 10 mg/ml oral suspension 30 mg  30 mg Per Tube Q6H Domenic Polite, MD   30 mg at 10/07/18 1433  . diltiazem (CARDIZEM) injection 10 mg  10 mg Intravenous Q6H PRN Manuella Ghazi, Pratik D, DO   10 mg at 10/07/18 0301  . feeding supplement (OSMOLITE 1.5 CAL) liquid 237 mL  237 mL Per Tube QID Domenic Polite, MD   237 mL at  10/07/18 1426  . feeding supplement (PRO-STAT SUGAR FREE 64) liquid 30 mL  30 mL Per Tube TID Domenic Polite, MD   30 mL at 10/07/18 1145  . free water 200 mL  200 mL Per Tube Q8H Erick Colace, NP   200 mL at 10/07/18 1426  . Gerhardt's butt cream   Topical BID Domenic Polite, MD      . insulin aspart (novoLOG) injection 0-15 Units  0-15 Units Subcutaneous Q4H Corey Harold, NP   3 Units at 10/07/18 1433  . levofloxacin (LEVAQUIN) IVPB 750 mg  750 mg Intravenous Q24H Blount, Lolita Cram, NP   Stopped at 10/07/18 0001  . MEDLINE mouth rinse  15 mL Mouth Rinse BID Erick Colace, NP   15 mL at 10/07/18 0851  .  metoprolol tartrate (LOPRESSOR) 25 mg/10 mL oral suspension 25 mg  25 mg Per Tube BID Domenic Polite, MD   25 mg at 10/07/18 0849  . metoprolol tartrate (LOPRESSOR) injection 5 mg  5 mg Intravenous Q5 min PRN Lovey Newcomer T, NP   5 mg at 10/07/18 0530  . multivitamin liquid 15 mL  15 mL Oral Daily Domenic Polite, MD   15 mL at 10/07/18 0849  . sodium chloride flush (NS) 0.9 % injection 3 mL  3 mL Intravenous Once Maudie Flakes, MD         Discharge Medications: Please see discharge summary for a list of discharge medications.  Relevant Imaging Results:  Relevant Lab Results:   Additional Information [id number]  Alexander [place], LCSWA

## 2018-10-07 NOTE — Progress Notes (Addendum)
PROGRESS NOTE    Jordan Reed  HDQ:[id number] DOB: January 14, 1951 DOA: 10/02/2018 PCP: Patient, No Pcp Per   Brief Narrative:  Per HPI: 68 year old female with advanced dementia, former long-term smoker, diabetes, hypothyroidismshe has no medical records with [place] or in care everywhere.she lives with her boyfriend who is also a caregiver who was unfortunately hospitalized 4 days prior to her admission, she was found slumped down in her front porch by neighbors, subsequently EMS was alerted she was found to be somnolent, noted to be in SVT, cardioverted  -See room she remained tachycardic, not narrow complex subsequently became agitated in the emergency room required sedation and subsequently intubation for airway protection. -Laboratory evaluation consistent with dehydration rhabdomyolysis and therefore IV fluid resuscitation was given. Patient admitted for hypoxemic respiratory failure as well as metabolic encephalopathy related to AKI with rhabdomyolysis. She is also noted to have atrial fibrillation with RVR for which she was started on heparin drip as well as amiodarone.  Patient has since converted to sinus tachycardia and amiodarone has been discontinued.    She was noted to have fever overnight on 4/16 for which she was initially given vancomycin as well as Zosyn.  Vancomycin has been discontinued and patient remains on Zosyn with sputum cultures demonstrating gram-negative rods.  She continues to have persistent encephalopathy with some facial droop, MRI negative for CVA. She was extubated on 4/17 -Has dysphagia currently getting tube feeds via NG tube -4/19 developed diffuse erythematous rash, likley drug rash, Zosyn was discontinued, she was started on levofloxacin instead  Assessment & Plan:   1.  Acute hypoxic respiratory failure -Patient was intubated for airway protection on admission due to severe agitation requiring sedation -Extubated 4/17 -wean O2 as tolerated  2. . AKI-  -Prerenal associated with rhabdomyolysis  -Resolved, discontinue IV fluids   3.  Atrial fibrillation with RVR  - complicated by hypotension initially, pretty briefly required amiodarone this was subsequently discontinued -2D echocardiogram with preserved EF, hyperdynamic LV -Currently on oral metoprolol, heart rate suboptimal, added Cardizem p.o. via NG tube -Discussed anticoagulation options with daughter yesterday she was transitioned from IV heparin to Eliquis -Was also noted to have a very low TSH on Synthroid which suggestive of over suppression, unclear how much Synthroid she was taking appropriately due to dementia, currently on hold, repeat TSH in 2weeks  4.  Fever/rash -I suspect this is drug fever, given concomitant diffuse erythematous blanching rash, initially I suspected "red man" syndrome from vancomycin which she had originally received in the ICU however this was discontinued 2 days ago at this time I suspect this could have been penicillin allergy, dosing discontinued last night, now on levofloxacin -Complete additional work-up, rule out other infectious etiologies  5.  Hyperthyroidism -History of hypothyroidism on Synthroid replacement however TSH was less than 0.01 -See discussion above, repeat in few weeks  6.  Dementia -Called daughter patient has advanced dementia at baseline, she is on Aricept and Namenda confused most of the time, able to recognize immediate family members only -Swallow evaluation today to assess for p.o. tolerance she was intubated only for 48hours  7.  Possible pneumonia/fever -X-ray and CT chest revealed right basilar atelectasis vs infiltrate -Sputum culture grew stenotrophomonas maltophilia -He on IV levofloxacin, see discussion above, concern for drug fever -We will also repeat chest x-ray  8.  DM/Hyperglycemia  -FU Hemoglobin A1c A1c  9. Long term smoker/suspected COPD  DVT prophylaxis:Heparin drip Code Status:DNR Family  Communication:Updated daughter Margreta Journey yesterday will call her  again this afternoon Disposition Plan:She will need SNF for rehab once medically optimized  Consultants:  PCCM  Procedures:  None  Antimicrobials:   Vancomycin 4/17-4/17  Zosyn 4/17->  Subjective: -generalized rash noted  Objective: Vitals:   10/07/18 0751 10/07/18 0848 10/07/18 0907 10/07/18 1005  BP:  119/60    Pulse:  (!) 122    Resp:      Temp: (!) 101.4 F (38.6 C)  99.6 F (37.6 C) 99.8 F (37.7 C)  TempSrc: Axillary  Oral Oral  SpO2:      Weight:      Height:        Intake/Output Summary (Last 24 hours) at 10/07/2018 1037 Last data filed at 10/07/2018 0844 Gross per 24 hour  Intake 2055.61 ml  Output 750 ml  Net 1305.61 ml   Filed Weights   10/04/18 0500 10/05/18 0500 10/06/18 0500  Weight: 74.3 kg 67.7 kg 72 kg    Examination:  Gen: Frail chronically ill female, oriented to self, partly to place HEENT: PERRLA, Neck supple, no JVD Lungs: Decreased breath sounds at both bases CVS: RRR,No Gallops,Rubs or new Murmurs Abd: soft, Non tender, non distended, BS present Extremities: No edema Skin: diffuse erythematous rash on face/neck/torse, thighs Psychiatry: Cognitive dysfunction, poor insight and judgment    Data Reviewed: I have personally reviewed following labs and imaging studies  CBC: Recent Labs  Lab 10/02/18 1100  10/03/18 0437 10/04/18 0327 10/05/18 0319 10/06/18 0252 10/07/18 0230  WBC 11.3*  --  12.1* 7.8 9.0 10.2 10.9*  NEUTROABS 9.7*  --   --   --   --   --   --   HGB 14.9   < > 12.9 11.1* 11.7* 13.2 12.1  HCT 47.5*   < > 38.4 33.8* 35.1* 39.6 35.0*  MCV 89.0  --  86.9 87.1 87.5 87.2 84.5  PLT 286  --  213 167 152 189 170   < > = values in this interval not displayed.   Basic Metabolic Panel: Recent Labs  Lab 10/03/18 0437 10/03/18 1032 10/03/18 1625 10/04/18 0327 10/04/18 1933 10/05/18 0319 10/06/18 0252 10/07/18 0230  NA 152*  --   --  147*   --  144 144 141  K 3.6  --   --  3.3*  --  3.7 3.6 3.3*  CL 123*  --   --  120*  --  118* 112* 112*  CO2 15*  --   --  22  --  17* 21* 23  GLUCOSE 164*  --   --  148*  --  115* 183* 169*  BUN 71*  --   --  60*  --  37* 25* 17  CREATININE 2.10*  --   --  1.23*  --  0.97 0.90 0.74  CALCIUM 8.8*  --   --  9.0  --  9.2 9.6 9.1  MG 2.2 2.3 2.2 2.2 2.1  --   --  1.6*  PHOS 4.9* 4.3 3.6 2.4* 2.3*  --   --   --    GFR: Estimated Creatinine Clearance: 67.9 mL/min (by C-G formula based on SCr of 0.74 mg/dL). Liver Function Tests: Recent Labs  Lab 10/02/18 1100 10/03/18 1625 10/04/18 0327 10/06/18 0252  AST 74* 49* 44* 31  ALT 43 37 34 39  ALKPHOS 95 71 67 74  BILITOT 2.4* 0.4 0.7 0.9  PROT 6.4* 4.7* 4.4* 5.3*  ALBUMIN 3.4* 2.2* 2.0* 2.2*   No results for input(s):  LIPASE, AMYLASE in the last 168 hours. No results for input(s): AMMONIA in the last 168 hours. Coagulation Profile: Recent Labs  Lab 10/02/18 1100  INR 1.2   Cardiac Enzymes: Recent Labs  Lab 10/02/18 1100 10/03/18 0437 10/04/18 0327 10/05/18 0319 10/06/18 0252  CKTOTAL 2,642* 1,594* 692* 353* 114  CKMB 42.8*  --   --   --   --   TROPONINI 0.08*  --   --   --   --    BNP (last 3 results) No results for input(s): PROBNP in the last 8760 hours. HbA1C: No results for input(s): HGBA1C in the last 72 hours. CBG: Recent Labs  Lab 10/06/18 1832 10/06/18 2031 10/06/18 2356 10/07/18 0347 10/07/18 0750  GLUCAP 159* 119* 170* 186* 160*   Lipid Profile: No results for input(s): CHOL, HDL, LDLCALC, TRIG, CHOLHDL, LDLDIRECT in the last 72 hours. Thyroid Function Tests: No results for input(s): TSH, T4TOTAL, FREET4, T3FREE, THYROIDAB in the last 72 hours. Anemia Panel: No results for input(s): VITAMINB12, FOLATE, FERRITIN, TIBC, IRON, RETICCTPCT in the last 72 hours. Sepsis Labs: Recent Labs  Lab 10/02/18 1100 10/03/18 1125 10/04/18 0327  LATICACIDVEN 4.5* 1.2 1.2    Recent Results (from the past 240  hour(s))  Culture, blood (Routine x 2)     Status: Abnormal   Collection Time: 10/02/18 11:00 AM  Result Value Ref Range Status   Specimen Description BLOOD LEFT ARM  Final   Special Requests   Final    BOTTLES DRAWN AEROBIC AND ANAEROBIC Blood Culture results may not be optimal due to an inadequate volume of blood received in culture bottles   Culture  Setup Time   Final    GRAM POSITIVE RODS CRITICAL RESULT CALLED TO, READ BACK BY AND VERIFIED WITH: RHRMD V BRTK '@0630'$  10/05/18 BY S GEZAHEGN    Culture (A)  Final    CORYNEBACTERIUM, GROUP JK Standardized susceptibility testing for this organism is not available. Performed at [facility], [place] [address], [place], [place] [address]    Report Status 10/07/2018 FINAL  Final  Culture, blood (Routine x 2)     Status: None (Preliminary result)   Collection Time: 10/02/18 11:03 AM  Result Value Ref Range Status   Specimen Description BLOOD LEFT HAND  Final   Special Requests   Final    BOTTLES DRAWN AEROBIC AND ANAEROBIC Blood Culture results may not be optimal due to an inadequate volume of blood received in culture bottles   Culture   Final    NO GROWTH 4 DAYS Performed at [facility], [place] [address] [place], [place] [address]    Report Status PENDING  Incomplete  MRSA PCR Screening     Status: None   Collection Time: 10/02/18 11:49 PM  Result Value Ref Range Status   MRSA by PCR NEGATIVE NEGATIVE Final    Comment:        The GeneXpert MRSA Assay (FDA approved for NASAL specimens only), is one component of a comprehensive MRSA colonization surveillance program. It is not intended to diagnose MRSA infection nor to guide or monitor treatment for MRSA infections. Performed at [facility], [place] [address], [place], [place] [address]   Urine Culture     Status: None   Collection Time: 10/03/18 12:11 PM  Result Value Ref Range Status   Specimen Description URINE, RANDOM  Final   Special Requests NONE  Final    Culture   Final  NO GROWTH Performed at [facility], [place] [address], [place], [place] [address]    Report Status 10/05/2018 FINAL  Final  Culture, respiratory (non-expectorated)     Status: None   Collection Time: 10/04/18  3:25 AM  Result Value Ref Range Status   Specimen Description TRACHEAL ASPIRATE  Final   Special Requests NONE  Final   Gram Stain   Final    ABUNDANT WBC PRESENT, PREDOMINANTLY PMN FEW GRAM NEGATIVE RODS Performed at [facility], [place] [address] [place], [place] [address]    Culture MODERATE STENOTROPHOMONAS MALTOPHILIA  Final   Report Status 10/06/2018 FINAL  Final   Organism ID, Bacteria STENOTROPHOMONAS MALTOPHILIA  Final      Susceptibility   Stenotrophomonas maltophilia - MIC*    LEVOFLOXACIN 0.5 SENSITIVE Sensitive     TRIMETH/SULFA 80 RESISTANT Resistant     * MODERATE STENOTROPHOMONAS MALTOPHILIA  Culture, blood (Routine X 2) w Reflex to ID Panel     Status: None (Preliminary result)   Collection Time: 10/04/18  3:48 AM  Result Value Ref Range Status   Specimen Description BLOOD LEFT ANTECUBITAL  Final   Special Requests   Final    BOTTLES DRAWN AEROBIC ONLY Blood Culture results may not be optimal due to an inadequate volume of blood received in culture bottles   Culture   Final    NO GROWTH 2 DAYS Performed at [facility], [place] [address], [place], [place] [address]    Report Status PENDING  Incomplete  Culture, blood (Routine X 2) w Reflex to ID Panel     Status: None (Preliminary result)   Collection Time: 10/04/18  3:48 AM  Result Value Ref Range Status   Specimen Description BLOOD LEFT HAND  Final   Special Requests   Final    BOTTLES DRAWN AEROBIC ONLY Blood Culture results may not be optimal due to an inadequate volume of blood received in culture bottles   Culture   Final    NO GROWTH 2 DAYS Performed at [facility], [place] [address] [place], [place] [address]    Report Status PENDING  Incomplete          Radiology Studies: Dg Abd 1 View  Result Date: 10/05/2018 CLINICAL DATA:  NG tube placement EXAM: ABDOMEN - 1 VIEW COMPARISON:  None. FINDINGS: Nasogastric tube with the tip projecting over the antrum of the stomach. There is no bowel dilatation to suggest obstruction. There is no evidence of pneumoperitoneum, portal venous gas or pneumatosis. There are no pathologic calcifications along the expected course of the ureters. The osseous structures are unremarkable. IMPRESSION: Nasogastric tube with the tip projecting over the antrum of the stomach. Electronically Signed   By: Kathreen Devoid   On: 10/05/2018 14:23   Mr Brain Wo Contrast  Result Date: 10/05/2018 CLINICAL DATA:  Encephalopathy. EXAM: MRI HEAD WITHOUT CONTRAST TECHNIQUE: Multiplanar, multiecho pulse sequences of the brain and surrounding structures were obtained without intravenous contrast. COMPARISON:  Head CT 10/02/2018 FINDINGS: Brain: There is no evidence of acute infarct, intracranial hemorrhage, mass, midline shift, or extra-axial fluid collection. Cerebral atrophy is moderately to severely advanced for age and greatest in the frontal lobes. Patchy T2 hyperintensities primarily affecting the white matter of the frontal lobes are nonspecific but compatible with mild chronic small vessel ischemic disease. Vascular: Major intracranial vascular flow voids are preserved. Skull and upper cervical spine: Unremarkable bone marrow signal. Sinuses/Orbits: Bilateral cataract extraction. Mild right sphenoid  sinus mucosal thickening. Small right mastoid effusion. Other: None. IMPRESSION: 1. No acute intracranial abnormality. 2. Moderately to severely age advanced cerebral atrophy and mild chronic small vessel ischemic disease. Electronically Signed   By: Logan Bores M.D.   On: 10/05/2018 12:39        Scheduled Meds: . apixaban  5 mg Oral BID  . chlorhexidine gluconate (MEDLINE KIT)  15 mL Mouth Rinse BID  . Chlorhexidine Gluconate Cloth  6  each Topical Daily  . diltiazem  30 mg Per Tube Q6H  . free water  200 mL Per Tube Q8H  . insulin aspart  0-15 Units Subcutaneous Q4H  . mouth rinse  15 mL Mouth Rinse BID  . metoprolol tartrate  25 mg Per Tube BID  . multivitamin  15 mL Oral Daily  . potassium chloride  40 mEq Oral Once  . sodium chloride flush  3 mL Intravenous Once   Continuous Infusions: . feeding supplement (OSMOLITE 1.2 CAL) 35 mL/hr at 10/07/18 0700  . levofloxacin (LEVAQUIN) IV Stopped (10/07/18 0001)     LOS: 5 days    Time spent: 30 minutes    Domenic Polite, MD  10/07/2018, 10:37 AM

## 2018-10-07 NOTE — Progress Notes (Signed)
Shift summary- MD informed of pt tele rhythm and ectopy, magnesium added to AM labs. Pt rec'd PRN cardizem for elevated pulse, ineffective. MD informed, PRN beta blocker added to regimen, and administered. Pulse decreased below intervention parameter.

## 2018-10-07 NOTE — Discharge Instructions (Signed)

## 2018-10-07 NOTE — Progress Notes (Signed)
Cortrak Tube Team Note:  Consult received to place a Cortrak feeding tube.   Pt already has NG tube in place. Per RN, pt no longer requires Cortrak tube to be placed. Order to be discontinued.    Romelle Starcher MS, RD, LDN, CNSC [phone] Pager  [phone] Weekend/On-Call Pager

## 2018-10-07 NOTE — Progress Notes (Signed)
  Speech Language Pathology Treatment: Dysphagia  Patient Details Name: Jordan Reed MRN: [id number] DOB: 04-20-1951 Today's Date: 10/07/2018 Time: 8366-2947 SLP Time Calculation (min) (ACUTE ONLY): 9 min  Assessment / Plan / Recommendation Clinical Impression  Pt remains lethargic and unsafe for PO diet. She demonstrates fleeting alertness, opening her eyes to name calling. She does make some attempts to speak to SLP but her speech is unintelligible. Even when her eyes are open, she makes no attempts to take POs from the spoon or the straw. Will continue to follow for PO readiness pending improvements in alertness/mentation.   HPI HPI: Patient with unknown past medical health. Found on porch slumped over. In ED she was intubated for airway protection, dehydration and rhabdomyolysis. She was extubated after 3 days on 10/04/18. She has no known history of swallowing difficulties.       SLP Plan  Continue with current plan of care       Recommendations  Diet recommendations: NPO Medication Administration: Via alternative means                Oral Care Recommendations: Oral care QID Follow up Recommendations: Skilled Nursing facility SLP Visit Diagnosis: Dysphagia, unspecified (R13.10) Plan: Continue with current plan of care       GO                Virl Axe [name] 10/07/2018, 2:43 PM  Ivar Drape, M.A. CCC-SLP Acute Herbalist [phone] Office [phone]

## 2018-10-08 ENCOUNTER — Inpatient Hospital Stay (HOSPITAL_COMMUNITY): Payer: Medicare HMO

## 2018-10-08 LAB — BASIC METABOLIC PANEL
Anion gap: 12 (ref 5–15)
BUN: 18 mg/dL (ref 8–23)
CO2: 22 mmol/L (ref 22–32)
Calcium: 9.4 mg/dL (ref 8.9–10.3)
Chloride: 110 mmol/L (ref 98–111)
Creatinine, Ser: 0.83 mg/dL (ref 0.44–1.00)
GFR calc Af Amer: >60 mL/min (ref >60)
GFR calc non Af Amer: >60 mL/min (ref >60)
Glucose, Bld: 149 mg/dL — ABNORMAL HIGH (ref 70–99)
Potassium: 3.9 mmol/L (ref 3.5–5.1)
Sodium: 144 mmol/L (ref 135–145)

## 2018-10-08 LAB — GLUCOSE, CAPILLARY
Glucose-Capillary: 154 mg/dL — ABNORMAL HIGH (ref 70–99)
Glucose-Capillary: 290 mg/dL — ABNORMAL HIGH (ref 70–99)
Glucose-Capillary: 316 mg/dL — ABNORMAL HIGH (ref 70–99)
Glucose-Capillary: 333 mg/dL — ABNORMAL HIGH (ref 70–99)
Glucose-Capillary: 340 mg/dL — ABNORMAL HIGH (ref 70–99)

## 2018-10-08 LAB — CBC
HCT: 40.8 % (ref 36.0–46.0)
Hemoglobin: 13.4 g/dL (ref 12.0–15.0)
MCH: 28.1 pg (ref 26.0–34.0)
MCHC: 32.8 g/dL (ref 30.0–36.0)
MCV: 85.5 fL (ref 80.0–100.0)
Platelets: 223 10*3/uL (ref 150–400)
RBC: 4.77 MIL/uL (ref 3.87–5.11)
RDW: 14.6 % (ref 11.5–15.5)
WBC: 12.3 10*3/uL — ABNORMAL HIGH (ref 4.0–10.5)
nRBC: 0 % (ref 0.0–0.2)

## 2018-10-08 LAB — HEPATIC FUNCTION PANEL
ALT: 34 U/L (ref 0–44)
AST: 35 U/L (ref 15–41)
Albumin: 1.8 g/dL — ABNORMAL LOW (ref 3.5–5.0)
Alkaline Phosphatase: 60 U/L (ref 38–126)
Bilirubin, Direct: 0.1 mg/dL (ref 0.0–0.2)
Indirect Bilirubin: 0.4 mg/dL (ref 0.3–0.9)
Total Bilirubin: 0.5 mg/dL (ref 0.3–1.2)
Total Protein: 4.6 g/dL — ABNORMAL LOW (ref 6.5–8.1)

## 2018-10-08 LAB — HEMOGLOBIN A1C
Hgb A1c MFr Bld: 5.7 % — ABNORMAL HIGH (ref 4.8–5.6)
Mean Plasma Glucose: 116.89 mg/dL

## 2018-10-08 LAB — PROCALCITONIN: Procalcitonin: <0.1 ng/mL

## 2018-10-08 MED ORDER — ACETAMINOPHEN 160 MG/5ML PO SOLN
650.0000 mg | Freq: Four times a day (QID) | ORAL | Status: DC | PRN
  Administered 2018-10-08 – 2018-10-10 (×5): 650 mg via ORAL
  Filled 2018-10-08 (×6): qty 20.3

## 2018-10-08 MED ORDER — METRONIDAZOLE IN NACL 5-0.79 MG/ML-% IV SOLN
500.0000 mg | Freq: Three times a day (TID) | INTRAVENOUS | Status: DC
  Administered 2018-10-08 – 2018-10-16 (×24): 500 mg via INTRAVENOUS
  Filled 2018-10-08 (×24): qty 100

## 2018-10-08 MED ORDER — SODIUM CHLORIDE 0.9 % IV SOLN
INTRAVENOUS | Status: DC
  Administered 2018-10-08 – 2018-10-09 (×3): via INTRAVENOUS

## 2018-10-08 MED ORDER — IOPAMIDOL (ISOVUE-300) INJECTION 61%
100.0000 mL | Freq: Once | INTRAVENOUS | Status: AC | PRN
  Administered 2018-10-08: 100 mL via INTRAVENOUS

## 2018-10-08 MED ORDER — METOPROLOL TARTRATE 5 MG/5ML IV SOLN
2.5000 mg | Freq: Once | INTRAVENOUS | Status: AC
  Administered 2018-10-08: 2.5 mg via INTRAVENOUS
  Filled 2018-10-08: qty 5

## 2018-10-08 MED ORDER — SODIUM CHLORIDE 0.9 % IV SOLN
2.0000 g | Freq: Three times a day (TID) | INTRAVENOUS | Status: DC
  Administered 2018-10-08 – 2018-10-09 (×4): 2 g via INTRAVENOUS
  Filled 2018-10-08 (×6): qty 2

## 2018-10-08 MED ORDER — MAGNESIUM SULFATE 2 GM/50ML IV SOLN
2.0000 g | Freq: Once | INTRAVENOUS | Status: AC
  Administered 2018-10-08: 2 g via INTRAVENOUS
  Filled 2018-10-08: qty 50

## 2018-10-08 MED ORDER — OSMOLITE 1.2 CAL PO LIQD
237.0000 mL | Freq: Every day | ORAL | Status: DC
  Administered 2018-10-08 – 2018-10-11 (×15): 237 mL
  Filled 2018-10-08 (×3): qty 237
  Filled 2018-10-08: qty 1000
  Filled 2018-10-08: qty 237
  Filled 2018-10-08: qty 1000
  Filled 2018-10-08 (×3): qty 237
  Filled 2018-10-08: qty 1000
  Filled 2018-10-08: qty 237
  Filled 2018-10-08: qty 1000
  Filled 2018-10-08: qty 237
  Filled 2018-10-08: qty 1000
  Filled 2018-10-08 (×3): qty 237
  Filled 2018-10-08 (×2): qty 1000
  Filled 2018-10-08 (×2): qty 237

## 2018-10-08 MED ORDER — DIGOXIN 0.25 MG/ML IJ SOLN
0.5000 mg | Freq: Once | INTRAMUSCULAR | Status: AC
  Administered 2018-10-08: 0.5 mg via INTRAVENOUS
  Filled 2018-10-08: qty 2

## 2018-10-08 MED ORDER — SODIUM CHLORIDE 0.9 % IV BOLUS
250.0000 mL | Freq: Once | INTRAVENOUS | Status: AC
  Administered 2018-10-08: 23:00:00 250 mL via INTRAVENOUS

## 2018-10-08 NOTE — Progress Notes (Signed)
Occupational Therapy Treatment Patient Details Name: Jordan Reed [place] MRN: [id number] DOB: 02/22/1951 Today's Date: 10/08/2018    History of present illness 68 year old female with no known past medical history. She was found by a car driving past her house noticed her to be slumped over on the front porch on 10/02/2018.  EMS was alerted and upon their arrival she had altered mental status, profoundly tachycardic with a narrow complex rhythm; She was cardioverted twice without effect and remained tachycardic.  Became increasingly agitated, intubated 4/15 for airway protection.  Laboratory evaluation consistent with dehydration rhabdomyolysis, IV fluid resuscitation was given and extubated on 4/17.    OT comments  Patient supine in bed. Remains confused, mumbled and incoherent responses throughout session; but noted more alert today.  Tolerated chair position in bed and PROM to BUEs, repositioned on pillows to assist with edema reduction.  Total assist to wash face, and guarding with mobility attempted to EOB.  Will follow, plan to update goals next session if not progressing.    Follow Up Recommendations  SNF;Supervision/Assistance - 24 hour    Equipment Recommendations  Other (comment)(TBD at next venue of care)    Recommendations for Other Services Other (comment)(palliative care)    Precautions / Restrictions Precautions Precautions: Fall Restrictions Weight Bearing Restrictions: No       Mobility Bed Mobility Overal bed mobility: Needs Assistance Bed Mobility: Rolling;Supine to Sit Rolling: Total assist;+2 for physical assistance         General bed mobility comments: repositioned in bed with total assist +2, attempted transition to EOB but limited initation and pt guarding movement; utilized chair position to improve position in bed   Transfers                      Balance Overall balance assessment: Needs assistance   Sitting balance-Leahy Scale: Zero        Standing balance-Leahy Scale: Zero                             ADL either performed or assessed with clinical judgement   ADL Overall ADL's : Needs assistance/impaired     Grooming: Wash/dry face;Bed level;Total assistance Grooming Details (indicate cue type and reason): total hand over hand assist to wash face, pt guarding to movement towards face with R hand                               General ADL Comments: total assist for all self care at this time      Vision       Perception     Praxis      Cognition Arousal/Alertness: Lethargic Behavior During Therapy: Flat affect Overall Cognitive Status: Difficult to assess                         Following Commands: Follows one step commands inconsistently   Awareness: Intellectual   General Comments: pt mumbling and responds incoherently throughout sesssion (unable to understand verbalizing her name today); but more alert than previous session         Exercises Exercises: General Upper Extremity General Exercises - Upper Extremity Shoulder Flexion: PROM;Both;10 reps;Supine Shoulder Extension: PROM;Both;10 reps;Supine Elbow Flexion: PROM;Both;10 reps;Supine Elbow Extension: PROM;Both;10 reps;Supine Digit Composite Flexion: PROM;Both;10 reps;Supine Composite Extension: PROM;Both;10 reps;Supine   Shoulder Instructions       General  Comments repositioned B UEs on pillows and provided PROM (below) to reduce edema    Pertinent Vitals/ Pain       Pain Assessment: Faces Faces Pain Scale: Hurts a little bit Pain Location: generalized grimacing Pain Descriptors / Indicators: Discomfort;Grimacing;Guarding Pain Intervention(s): Limited activity within patient's tolerance;Repositioned  Home Living                                          Prior Functioning/Environment              Frequency  Min 2X/week        Progress Toward Goals  OT Goals(current  goals can now be found in the care plan section)  Progress towards OT goals: Progressing toward goals  Acute Rehab OT Goals Patient Stated Goal: unable to state  Plan Discharge plan remains appropriate;Frequency remains appropriate    Co-evaluation    PT/OT/SLP Co-Evaluation/Treatment: Yes Reason for Co-Treatment: Complexity of the patient's impairments (multi-system involvement) PT goals addressed during session: Balance;Strengthening/ROM OT goals addressed during session: ADL's and self-care      AM-PAC OT "6 Clicks" Daily Activity     Outcome Measure   Help from another person eating meals?: Total Help from another person taking care of personal grooming?: Total Help from another person toileting, which includes using toliet, bedpan, or urinal?: Total Help from another person bathing (including washing, rinsing, drying)?: Total Help from another person to put on and taking off regular upper body clothing?: Total Help from another person to put on and taking off regular lower body clothing?: Total 6 Click Score: 6    End of Session Equipment Utilized During Treatment: Oxygen  OT Visit Diagnosis: Other abnormalities of gait and mobility (R26.89);Other symptoms and signs involving cognitive function;Cognitive communication deficit (R41.841);Muscle weakness (generalized) (M62.81) Symptoms and signs involving cognitive functions: Other cerebrovascular disease   Activity Tolerance Patient limited by lethargy;Other (comment)(cognition)   Patient Left in bed;with call bell/phone within reach;with bed alarm set   Nurse Communication Mobility status        Time: 8299-3716 OT Time Calculation (min): 25 min  Charges: OT General Charges $OT Visit: 1 Visit OT Treatments $Self Care/Home Management : 8-22 mins  Chancy Milroy, OT Acute Rehabilitation Services Pager [phone] Office [phone]    Chancy Milroy 10/08/2018, 4:15 PM

## 2018-10-08 NOTE — Progress Notes (Signed)
Inpatient Diabetes Program Recommendations  AACE/ADA: New Consensus Statement on Inpatient Glycemic Control (2015)  Target Ranges:  Prepandial:   less than 140 mg/dL      Peak postprandial:   less than 180 mg/dL (1-2 hours)      Critically ill patients:  140 - 180 mg/dL   Results for Jordan Reed, Jordan Reed (MRN [id number]) as of 10/08/2018 09:55  Ref. Range 10/06/2018 23:56 10/07/2018 03:47 10/07/2018 07:50 10/07/2018 12:31 10/07/2018 16:47 10/07/2018 19:39  Glucose-Capillary Latest Ref Range: 70 - 99 mg/dL 861 (H)  3 units NOVOLOG  186 (H)  3 units NOVOLOG  160 (H)  3 units NOVOLOG  174 (H)  3 units NOVOLOG  292 (H)  8 units NOVOLOG  212 (H)  5 units NOVOLOG    Results for Jordan Reed, Jordan Reed (MRN [id number]) as of 10/08/2018 09:55  Ref. Range 10/07/2018 23:56 10/08/2018 04:02  Glucose-Capillary Latest Ref Range: 70 - 99 mg/dL 115 (H)  5 units NOVOLOG  154 (H)  3 units NOVOLOG     Admit with: 68 year old female found down with altered mental status admitted 4/15 with acute renal failure from dehydration/rhabdomyolysis  History: DM, Dementia  Home DM Meds: None listed  Current Orders: Novolog Moderate Correction Scale/ SSI (0-15 units) Q4 hours      Remains NPO.  Getting Osmolite bolus feeds 237 ml QID.     MD- Note pt now having glucose elevations with the bolus tube feeds.  Please consider the following to help assist with pt's glucose control:  1. Change Novolog SSI to correspond with the timing of the Osmolite bolus tube feeds: Change to 10am, 2pm, 6pm, 10pm  Currently Novolog SSi ordered Q4 hours   2. Add small amount of Novolog coverage to help with the carbohydrates in the tube feed boluses:  Novolog 3 units QID with bolus feeds (10am, 2pm, 6pm, 10pm)  Do NOT give if bolus feed HELD for any reason     --Will follow patient during hospitalization--  Ambrose Finland RN, MSN, CDE Diabetes Coordinator Inpatient Glycemic Control Team Team Pager:  [phone] (8a-5p)

## 2018-10-08 NOTE — Progress Notes (Signed)
Pharmacy Antibiotic Note  Jordan Reed is a 68 y.o. female admitted on 10/02/2018 with fever.  Pharmacy has been consulted for fortaz dosing.   Still having fevers overnight, tmax 101.9, wbc 12.3. LVQ stopped due to concern over drug fevers, zosyn stopped d/t possible drug rash. Discussed options with MD, will try ceftaz as third line option. Unfortunately micro was unable to add sensitivities. Will plan to continue empiric fortaz/flagyl for steno and aspiration. If fevers/condition worsens will consider ID consult vs restarting LVQ.   Plan: Fortaz 2g q8 hours Flagyl 500mg  q8 hours  Height: 5\' 5"  (165.1 cm) Weight: 168 lb 3.4 oz (76.3 kg) IBW/kg (Calculated) : 57  Temp (24hrs), Avg:99.8 F (37.7 C), Min:98.2 F (36.8 C), Max:101.9 F (38.8 C)  Recent Labs  Lab 10/02/18 1100  10/03/18 1125 10/04/18 0327 10/05/18 0319 10/06/18 0252 10/07/18 0230 10/07/18 1154 10/08/18 0249 10/08/18 0734  WBC 11.3*   < >  --  7.8 9.0 10.2 10.9* 11.7* 12.3*  --   CREATININE 3.43*   < >  --  1.23* 0.97 0.90 0.74  --   --  0.83  LATICACIDVEN 4.5*  --  1.2 1.2  --   --   --   --   --   --    < > = values in this interval not displayed.    Estimated Creatinine Clearance: 67.2 mL/min (by C-G formula based on SCr of 0.83 mg/dL).    Allergies  Allergen Reactions  . Codeine Hives and Itching  . Penicillins Rash   Zosyn 4/17>> 4/19 Levaquin 4/19>>4/21 Vanc 4/17 x1 Fortaz 4/21>  MRSA PCR 4/15: neg Ucx 4/16: ng Bcx 4/15: GPR 1/4 bottles - corynebacterium  TA 4/17: mod steno - sen to LVQ Bcx 4/17: ngtd  Thank you for allowing pharmacy to be a part of this patient's care.  Sheppard Coil PharmD., BCPS Clinical Pharmacist 10/08/2018 10:58 AM

## 2018-10-08 NOTE — Progress Notes (Signed)
  Speech Language Pathology Treatment: Dysphagia  Patient Details Name: Jordan Reed MRN: [id number] DOB: 1950/09/07 Today's Date: 10/08/2018 Time: [id number] SLP Time Calculation (min) (ACUTE ONLY): 10 min  Assessment / Plan / Recommendation Clinical Impression  Pt is mildly more alert than on previous date, but still needs Max faded to Mod cues for acceptance of POs. Regardless of consistency, pt had increased amount of time from bolus presentation to swallow initiation, multiple subswallows, and facial grimacing. Coughing was associated with thin liquids. Given the above as well as new fever, would continue to hold POs at this time. Will f/u for readiness.   HPI HPI: Patient with unknown past medical health. Found on porch slumped over. In ED she was intubated for airway protection, dehydration and rhabdomyolysis. She was extubated after 3 days on 10/04/18. She has no known history of swallowing difficulties.       SLP Plan  Continue with current plan of care       Recommendations  Diet recommendations: NPO Medication Administration: Via alternative means                Oral Care Recommendations: Oral care QID Follow up Recommendations: Skilled Nursing facility SLP Visit Diagnosis: Dysphagia, unspecified (R13.10) Plan: Continue with current plan of care       GO                Virl Axe Taysen Bushart 10/08/2018, 4:22 PM  Ivar Drape, M.A. CCC-SLP Acute Herbalist [phone] Office [phone]

## 2018-10-08 NOTE — Progress Notes (Signed)
Brief Nutrition Note  RD was informed that pharmacy is out of Osmolite 1.5 ARC for bolus feeders. New shipment expected on Thursday, 4/23.  Discussed with RN who reports there are 2 containers of Osmolite 1.5 left on the unit.  RD to change order to Osmolite 1.2 until more Osmolite 1.5 can be obtained.  Tube feeding via NG tube: - Osmolite 1.2 cal 1 can 5 times daily - Continue Pro-stat 30 ml TID - Continue free water per MD, currently 200 ml q 8 hours  Tube feeding regimen provides 1722 kcal, 111 grams of protein, and 1572 ml of H2O (100% of needs).   Earma Reading, MS, RD, LDN Inpatient Clinical Dietitian Pager: [phone] Weekend/After Hours: [phone]

## 2018-10-08 NOTE — Progress Notes (Addendum)
PROGRESS NOTE    Jordan Reed  HFW:[id number] DOB: 04/06/1951 DOA: 10/02/2018 PCP: Patient, No Pcp Per   Brief Narrative:  Per HPI: 68 year old female with advanced dementia, former long-term smoker, diabetes, hypothyroidismshe has no medical records with [place] or in care everywhere.she lives with her boyfriend who is also a caregiver who was unfortunately hospitalized 4 days prior to her admission, she was found slumped down in her front porch by neighbors, subsequently EMS was alerted she was found to be somnolent, noted to be in SVT, cardioverted  -In ED she remained tachycardic, narrow complex subsequently became agitated in the emergency room required sedation and subsequently intubation for airway protection, also had low grade temp of 100. -Laboratory evaluation consistent with dehydration rhabdomyolysis and therefore IV fluid resuscitation was given. Patient admitted to ICU for hypoxemic respiratory failure as well as metabolic encephalopathy related to AKI with rhabdomyolysis. She is also noted to have atrial fibrillation with RVR for which she was started on heparin drip as well as amiodarone.  Patient has since converted to sinus tachycardia and amiodarone has been discontinued.    On admission to ICU, noted to have fever overnight on 4/16 for which she was initially given vancomycin as well as Zosyn.  Vancomycin has been discontinued and patient remains on Zosyn with sputum cultures demonstrating gram-negative rods.  She continues to have persistent encephalopathy with some facial droop, MRI negative for CVA. She was extubated on 4/17 -Has dysphagia currently getting tube feeds via NG tube -4/18: transferred from PCCM to [facility] service -4/19 developed diffuse erythematous rash, likley drug rash, Zosyn was discontinued, she was started on levofloxacin instead -4/20: worsening fever to 101, CXR with bibasilar opacities concerning for Asp PNA  Assessment & Plan:   1.  Acute hypoxic  respiratory failure -Patient was intubated for airway protection on admission due to severe agitation requiring sedation -Extubated 4/17 -O2 quickly resolved [facility] course further complicated by aspiration pneumonia 4/20, unfortunately developed a severe drug rash and hence Zosyn was discontinued and she was started on Ceftazidine today with Flagyl, requiring 2L at this time -CXR with bibasilar opacities, given lethargy and dementia, aspiration of secretions is most likely explanation  2.  Aspiration pneumonia/Fever/Drug rash -Patient was recently admitted to the ICU with low-grade fever, agitation, SVT -Was started on IV Zosyn in the ICU, sputum cultures grew stenotrophomonas maltophilia of questionable significance. -Subsequently developed diffused drug rash, vancomycin was  initially discontinued, the following day Zosyn was discontinued due to persistent rash. Fevers worsened, Repeat x-ray 4/20 shows bibasilar opacities concerning for aspiration -Drug rash is improving, fevers of 101 range last night, I have transitioned her from [place] over to [place] -Currently has an NG tube, getting nutrition via this, can still be aspirating her secretions, has wet cough with rhonchorous breath sounds -In addition has more profound epigastric abdominal tenderness today, due to dementia and lethargy patient is unable to contribute to history, will obtain CT abdomen pelvis  3. Abd tenderness -profound on exam today -check CT as noted above  4. . AKI- -Prerenal associated with rhabdomyolysis  -Resolved, discontinue IV fluids   5.  Atrial fibrillation with RVR  - complicated by hypotension initially, briefly required amiodarone this was subsequently discontinued -2D echocardiogram with preserved EF, hyperdynamic LV -Currently on oral metoprolol, heart rate suboptimal, added Cardizem p.o. via NG tube -Discussed anticoagulation options with daughter 4/19 she was transitioned from IV heparin to Eliquis  -Was also noted to have a very low TSH on Synthroid  which suggestive of over suppression, unclear how much Synthroid she was taking appropriately due to dementia, currently on hold, repeat TSH in 2weeks  6.  Hyperthyroidism -History of hypothyroidism on Synthroid replacement however TSH was less than 0.01 -See discussion above, repeat in few weeks  7.  Dementia -Called daughter patient has mod to advanced dementia at baseline, she is on Aricept and Namenda confused part of the time, able to recognize immediate family members only  -SLP eval when mentation better  8.  DM/Hyperglycemia  -FU Hemoglobin A1c A1c  9. Long term smoker/suspected COPD  DVT prophylaxis:Heparin drip Code Status:DNR Family Communication:Updated daughter Margreta Journey 4/19 and again today 4/21 Disposition Plan:She will need SNF for rehab once medically optimized  Consultants:  PCCM  Procedures:  None  Antimicrobials:   Vancomycin 4/17-4/17  Zosyn 4/17->  Subjective: -Patient was febrile, continues to have sinus tachycardia, tolerating tube feeds -Drug rash still noted to be present but slight improvement over her face  Objective: Vitals:   10/08/18 0413 10/08/18 0500 10/08/18 0717 10/08/18 0830  BP: 103/67     Pulse:      Resp: (!) 21     Temp: 100 F (37.8 C)  (!) 100.7 F (38.2 C) 99 F (37.2 C)  TempSrc: Axillary  Axillary Oral  SpO2: 100%     Weight:  76.3 kg    Height:       No intake or output data in the 24 hours ending 10/08/18 1026 Filed Weights   10/05/18 0500 10/06/18 0500 10/08/18 0500  Weight: 67.7 kg 72 kg 76.3 kg    Examination:  Gen: Awake alert, oriented to self only pleasantly confused, ill-appearing HEENT: PERRLA, Neck supple, no JVD Lungs: Conducted upper airway sounds, rhonchi both bases CVS: RRR,No Gallops,Rubs or new Murmurs Abd: Soft, epigastric tenderness with mild rebound noted Extremities: Trace edema Skin: Generalized erythematous rash noted mild  improvement over the face from yesterday still pretty profound over her neck thighs etc. Psychiatry: Cognitive dysfunction, poor insight and judgment    Data Reviewed: I have personally reviewed following labs and imaging studies  CBC: Recent Labs  Lab 10/02/18 1100  10/05/18 0319 10/06/18 0252 10/07/18 0230 10/07/18 1154 10/08/18 0249  WBC 11.3*   < > 9.0 10.2 10.9* 11.7* 12.3*  NEUTROABS 9.7*  --   --   --   --  9.6*  --   HGB 14.9   < > 11.7* 13.2 12.1 13.1 13.4  HCT 47.5*   < > 35.1* 39.6 35.0* 39.4 40.8  MCV 89.0   < > 87.5 87.2 84.5 86.6 85.5  PLT 286   < > 152 189 170 188 223   < > = values in this interval not displayed.   Basic Metabolic Panel: Recent Labs  Lab 10/03/18 0437 10/03/18 1032 10/03/18 1625 10/04/18 0327 10/04/18 1933 10/05/18 0319 10/06/18 0252 10/07/18 0230 10/08/18 0734  NA 152*  --   --  147*  --  144 144 141 144  K 3.6  --   --  3.3*  --  3.7 3.6 3.3* 3.9  CL 123*  --   --  120*  --  118* 112* 112* 110  CO2 15*  --   --  22  --  17* 21* 23 22  GLUCOSE 164*  --   --  148*  --  115* 183* 169* 149*  BUN 71*  --   --  60*  --  37* 25* 17 18  CREATININE  2.10*  --   --  1.23*  --  0.97 0.90 0.74 0.83  CALCIUM 8.8*  --   --  9.0  --  9.2 9.6 9.1 9.4  MG 2.2 2.3 2.2 2.2 2.1  --   --  1.6*  --   PHOS 4.9* 4.3 3.6 2.4* 2.3*  --   --   --   --    GFR: Estimated Creatinine Clearance: 67.2 mL/min (by C-G formula based on SCr of 0.83 mg/dL). Liver Function Tests: Recent Labs  Lab 10/02/18 1100 10/03/18 1625 10/04/18 0327 10/06/18 0252  AST 74* 49* 44* 31  ALT 43 37 34 39  ALKPHOS 95 71 67 74  BILITOT 2.4* 0.4 0.7 0.9  PROT 6.4* 4.7* 4.4* 5.3*  ALBUMIN 3.4* 2.2* 2.0* 2.2*   No results for input(s): LIPASE, AMYLASE in the last 168 hours. No results for input(s): AMMONIA in the last 168 hours. Coagulation Profile: Recent Labs  Lab 10/02/18 1100  INR 1.2   Cardiac Enzymes: Recent Labs  Lab 10/02/18 1100 10/03/18 0437 10/04/18 0327  10/05/18 0319 10/06/18 0252  CKTOTAL 2,642* 1,594* 692* 353* 114  CKMB 42.8*  --   --   --   --   TROPONINI 0.08*  --   --   --   --    BNP (last 3 results) No results for input(s): PROBNP in the last 8760 hours. HbA1C: No results for input(s): HGBA1C in the last 72 hours. CBG: Recent Labs  Lab 10/07/18 1231 10/07/18 1647 10/07/18 1939 10/07/18 2356 10/08/18 0402  GLUCAP 174* 292* 212* 244* 154*   Lipid Profile: No results for input(s): CHOL, HDL, LDLCALC, TRIG, CHOLHDL, LDLDIRECT in the last 72 hours. Thyroid Function Tests: No results for input(s): TSH, T4TOTAL, FREET4, T3FREE, THYROIDAB in the last 72 hours. Anemia Panel: No results for input(s): VITAMINB12, FOLATE, FERRITIN, TIBC, IRON, RETICCTPCT in the last 72 hours. Sepsis Labs: Recent Labs  Lab 10/02/18 1100 10/03/18 1125 10/04/18 0327  LATICACIDVEN 4.5* 1.2 1.2    Recent Results (from the past 240 hour(s))  Culture, blood (Routine x 2)     Status: Abnormal   Collection Time: 10/02/18 11:00 AM  Result Value Ref Range Status   Specimen Description BLOOD LEFT ARM  Final   Special Requests   Final    BOTTLES DRAWN AEROBIC AND ANAEROBIC Blood Culture results may not be optimal due to an inadequate volume of blood received in culture bottles   Culture  Setup Time   Final    GRAM POSITIVE RODS CRITICAL RESULT CALLED TO, READ BACK BY AND VERIFIED WITH: RHRMD V BRTK _0  10/05/18 BY S GEZAHEGN    Culture (A)  Final    CORYNEBACTERIUM, GROUP JK Standardized susceptibility testing for this organism is not available. Performed at [facility], [place] [address], [place], [place] [address]    Report Status 10/07/2018 FINAL  Final  Culture, blood (Routine x 2)     Status: None   Collection Time: 10/02/18 11:03 AM  Result Value Ref Range Status   Specimen Description BLOOD LEFT HAND  Final   Special Requests   Final    BOTTLES DRAWN AEROBIC AND ANAEROBIC Blood Culture results may not be optimal due to an  inadequate volume of blood received in culture bottles   Culture   Final    NO GROWTH 5 DAYS Performed at [facility], [place] [address], [place], [place] [address]    Report Status 10/07/2018 FINAL  Final  MRSA PCR Screening     Status: None   Collection Time: 10/02/18 11:49 PM  Result Value Ref Range Status   MRSA by PCR NEGATIVE NEGATIVE Final    Comment:        The GeneXpert MRSA Assay (FDA approved for NASAL specimens only), is one component of a comprehensive MRSA colonization surveillance program. It is not intended to diagnose MRSA infection nor to guide or monitor treatment for MRSA infections. Performed at [facility], [place] [address], [place], [place] [address]   Urine Culture     Status: None   Collection Time: 10/03/18 12:11 PM  Result Value Ref Range Status   Specimen Description URINE, RANDOM  Final   Special Requests NONE  Final   Culture   Final    NO GROWTH Performed at [facility], [place] [address], [place], [place] [address]    Report Status 10/05/2018 FINAL  Final  Culture, respiratory (non-expectorated)     Status: None   Collection Time: 10/04/18  3:25 AM  Result Value Ref Range Status   Specimen Description TRACHEAL ASPIRATE  Final   Special Requests NONE  Final   Gram Stain   Final    ABUNDANT WBC PRESENT, PREDOMINANTLY PMN FEW GRAM NEGATIVE RODS Performed at [facility], [place] [address] [place], [place] [address]    Culture MODERATE STENOTROPHOMONAS MALTOPHILIA  Final   Report Status 10/06/2018 FINAL  Final   Organism ID, Bacteria STENOTROPHOMONAS MALTOPHILIA  Final      Susceptibility   Stenotrophomonas maltophilia - MIC*    LEVOFLOXACIN 0.5 SENSITIVE Sensitive     TRIMETH/SULFA 80 RESISTANT Resistant     * MODERATE STENOTROPHOMONAS MALTOPHILIA  Culture, blood (Routine X 2) w Reflex to ID Panel     Status: None (Preliminary result)   Collection Time: 10/04/18  3:48 AM  Result Value Ref Range Status   Specimen  Description BLOOD LEFT ANTECUBITAL  Final   Special Requests   Final    BOTTLES DRAWN AEROBIC ONLY Blood Culture results may not be optimal due to an inadequate volume of blood received in culture bottles   Culture   Final    NO GROWTH 3 DAYS Performed at [facility], [place] [address] [place], [place] [address]    Report Status PENDING  Incomplete  Culture, blood (Routine X 2) w Reflex to ID Panel     Status: None (Preliminary result)   Collection Time: 10/04/18  3:48 AM  Result Value Ref Range Status   Specimen Description BLOOD LEFT HAND  Final   Special Requests   Final    BOTTLES DRAWN AEROBIC ONLY Blood Culture results may not be optimal due to an inadequate volume of blood received in culture bottles   Culture   Final    NO GROWTH 3 DAYS Performed at [facility], [place] [address] [place], [place] [address]    Report Status PENDING  Incomplete         Radiology Studies: Dg Chest Port 1 View  Result Date: 10/07/2018 CLINICAL DATA:  68 year old female with fever EXAM: PORTABLE CHEST 1 VIEW COMPARISON:  Prior chest x-ray 10/03/2018 FINDINGS: The patient has been extubated. A gastric tube remains in place. The tip of the tube lies off the field of view, presumably within the stomach. New bibasilar airspace opacities which are veil like on the right, and more consolidated on the left with obscuration of the hemidiaphragm. Overall,  the inspiratory volumes are lower resulting in crowding of the pulmonary vasculature. However, there is no overt edema. No pneumothorax. No acute osseous abnormality. Surgical changes in the left upper quadrant, right upper quadrant and right shoulder. IMPRESSION: 1. Lower inspiratory volumes following extubation. 2. New bibasilar airspace opacities favored to reflect a combination of small layering pleural effusions and atelectasis. In the appropriate clinical setting, pneumonia or aspiration would be difficult to exclude radiographically.  Electronically Signed   By: Jacqulynn Cadet M.D.   On: 10/07/2018 12:58        Scheduled Meds: . apixaban  5 mg Oral BID  . chlorhexidine gluconate (MEDLINE KIT)  15 mL Mouth Rinse BID  . Chlorhexidine Gluconate Cloth  6 each Topical Daily  . diltiazem  30 mg Per Tube Q6H  . feeding supplement (OSMOLITE 1.5 CAL)  237 mL Per Tube QID  . feeding supplement (PRO-STAT SUGAR FREE 64)  30 mL Per Tube TID  . free water  200 mL Per Tube Q8H  . Gerhardt's butt cream   Topical BID  . insulin aspart  0-15 Units Subcutaneous Q4H  . mouth rinse  15 mL Mouth Rinse BID  . metoprolol tartrate  25 mg Per Tube BID  . multivitamin  15 mL Oral Daily  . sodium chloride flush  3 mL Intravenous Once   Continuous Infusions: . sodium chloride 75 mL/hr at 10/08/18 0939  . magnesium sulfate 1 - 4 g bolus IVPB 2 g (10/08/18 1006)  . metronidazole 500 mg (10/08/18 0942)     LOS: 6 days    Time spent: 35 minutes    Domenic Polite, MD  10/08/2018, 10:26 AM

## 2018-10-08 NOTE — Care Management Important Message (Signed)
Important Message  Patient Details  Name: Jordan Reed MRN: [id number] Date of Birth: Jan 08, 1951   Medicare Important Message Given:  Yes    Issam Carlyon Stefan Church 10/08/2018, 2:14 PM

## 2018-10-08 NOTE — Progress Notes (Signed)
Physical Therapy Treatment Patient Details Name: Jordan Reed MRN: [id number] DOB: 04-Oct-1950 Today's Date: 10/08/2018    History of Present Illness 68 year old female with no known past medical history. She was found by a car driving past her house noticed her to be slumped over on the front porch on 10/02/2018.  EMS was alerted and upon their arrival she had altered mental status, profoundly tachycardic with a narrow complex rhythm; She was cardioverted twice without effect and remained tachycardic.  Became increasingly agitated, intubated 4/15 for airway protection.  Laboratory evaluation consistent with dehydration rhabdomyolysis, IV fluid resuscitation was given and extubated on 4/17.     PT Comments    Patient with mild progression in cognition today, continues to only follow some commands, largly confused and AOx0 at this time. Unsafe to mobilize to EOB due to confusion and patient bracing herself against multimodal cues to do faciliate. Repositioned in bed and put into chair mode patient able to tolerate from vital standpoint. Discussed with RN.   BP supine 100/50 (59) BP Seated 110/70 (64)    Follow Up Recommendations  SNF;Supervision/Assistance - 24 hour     Equipment Recommendations  Other (comment)    Recommendations for Other Services       Precautions / Restrictions Precautions Precautions: Fall Restrictions Weight Bearing Restrictions: No    Mobility  Bed Mobility Overal bed mobility: Needs Assistance Bed Mobility: Rolling;Supine to Sit Rolling: Total assist;+2 for physical assistance         General bed mobility comments: slid patient with total A x2 to better position in bed and moved in to chair mode. tolerated well per vitals.   Transfers                    Ambulation/Gait                 Stairs             Wheelchair Mobility    Modified Rankin (Stroke Patients Only)       Balance Overall balance assessment: Needs  assistance   Sitting balance-Leahy Scale: Zero       Standing balance-Leahy Scale: Zero                              Cognition Arousal/Alertness: Lethargic Behavior During Therapy: Flat affect Overall Cognitive Status: Difficult to assess                                 General Comments: AOx0, talking incoherent responses, can follow some cues "squeeze my finger" but not many others.       Exercises      General Comments        Pertinent Vitals/Pain Pain Assessment: Faces Faces Pain Scale: Hurts a little bit Pain Location: generalized grimacing    Home Living                      Prior Function            PT Goals (current goals can now be found in the care plan section) Acute Rehab PT Goals Patient Stated Goal: unable to state PT Goal Formulation: Patient unable to participate in goal setting Time For Goal Achievement: 10/19/18 Potential to Achieve Goals: Good Progress towards PT goals: Progressing toward goals    Frequency    Min 2X/week  PT Plan Current plan remains appropriate    Co-evaluation PT/OT/SLP Co-Evaluation/Treatment: Yes Reason for Co-Treatment: Complexity of the patient's impairments (multi-system involvement);Necessary to address cognition/behavior during functional activity;For patient/therapist safety PT goals addressed during session: Balance;Strengthening/ROM        AM-PAC PT "6 Clicks" Mobility   Outcome Measure  Help needed turning from your back to your side while in a flat bed without using bedrails?: Total Help needed moving from lying on your back to sitting on the side of a flat bed without using bedrails?: Total Help needed moving to and from a bed to a chair (including a wheelchair)?: Total Help needed standing up from a chair using your arms (e.g., wheelchair or bedside chair)?: Total Help needed to walk in hospital room?: Total Help needed climbing 3-5 steps with a railing? :  Total 6 Click Score: 6    End of Session Equipment Utilized During Treatment: Gait belt Activity Tolerance: Patient limited by fatigue Patient left: in bed;with call bell/phone within reach;with nursing/sitter in room Nurse Communication: Mobility status PT Visit Diagnosis: Unsteadiness on feet (R26.81);History of falling (Z91.81);Muscle weakness (generalized) (M62.81);Pain     Time: [id number] PT Time Calculation (min) (ACUTE ONLY): 22 min  Charges:  $Therapeutic Activity: 8-22 mins                     Etta GrandchildSean Tullio Chausse, PT, DPT Acute Rehabilitation Services Pager: [phone] Office: [phone]     Etta GrandchildSean Electra Paladino 10/08/2018, 3:09 PM

## 2018-10-09 DIAGNOSIS — Z515 Encounter for palliative care: Secondary | ICD-10-CM

## 2018-10-09 DIAGNOSIS — J9601 Acute respiratory failure with hypoxia: Secondary | ICD-10-CM

## 2018-10-09 DIAGNOSIS — G300 Alzheimer's disease with early onset: Secondary | ICD-10-CM

## 2018-10-09 DIAGNOSIS — F0280 Dementia in other diseases classified elsewhere without behavioral disturbance: Secondary | ICD-10-CM

## 2018-10-09 LAB — CULTURE, BLOOD (ROUTINE X 2)
Culture: NO GROWTH
Culture: NO GROWTH

## 2018-10-09 LAB — COMPREHENSIVE METABOLIC PANEL
ALT: 33 U/L (ref 0–44)
AST: 25 U/L (ref 15–41)
Albumin: 1.7 g/dL — ABNORMAL LOW (ref 3.5–5.0)
Alkaline Phosphatase: 70 U/L (ref 38–126)
Anion gap: 8 (ref 5–15)
BUN: 44 mg/dL — ABNORMAL HIGH (ref 8–23)
CO2: 19 mmol/L — ABNORMAL LOW (ref 22–32)
Calcium: 9.7 mg/dL (ref 8.9–10.3)
Chloride: 114 mmol/L — ABNORMAL HIGH (ref 98–111)
Creatinine, Ser: 1.39 mg/dL — ABNORMAL HIGH (ref 0.44–1.00)
GFR calc Af Amer: 45 mL/min — ABNORMAL LOW (ref >60)
GFR calc non Af Amer: 39 mL/min — ABNORMAL LOW (ref >60)
Glucose, Bld: 250 mg/dL — ABNORMAL HIGH (ref 70–99)
Potassium: 4.2 mmol/L (ref 3.5–5.1)
Sodium: 141 mmol/L (ref 135–145)
Total Bilirubin: 0.4 mg/dL (ref 0.3–1.2)
Total Protein: 4.6 g/dL — ABNORMAL LOW (ref 6.5–8.1)

## 2018-10-09 LAB — URINALYSIS, ROUTINE W REFLEX MICROSCOPIC
Bilirubin Urine: NEGATIVE
Glucose, UA: NEGATIVE mg/dL
Ketones, ur: NEGATIVE mg/dL
Nitrite: NEGATIVE
Protein, ur: NEGATIVE mg/dL
RBC / HPF: >50 RBC/hpf — ABNORMAL HIGH (ref 0–5)
Specific Gravity, Urine: 1.038 — ABNORMAL HIGH (ref 1.005–1.030)
WBC, UA: >50 WBC/hpf — ABNORMAL HIGH (ref 0–5)
pH: 6 (ref 5.0–8.0)

## 2018-10-09 LAB — CBC
HCT: 39.2 % (ref 36.0–46.0)
Hemoglobin: 12.7 g/dL (ref 12.0–15.0)
MCH: 28.5 pg (ref 26.0–34.0)
MCHC: 32.4 g/dL (ref 30.0–36.0)
MCV: 87.9 fL (ref 80.0–100.0)
Platelets: 268 10*3/uL (ref 150–400)
RBC: 4.46 MIL/uL (ref 3.87–5.11)
RDW: 15.1 % (ref 11.5–15.5)
WBC: 13.9 10*3/uL — ABNORMAL HIGH (ref 4.0–10.5)
nRBC: 0 % (ref 0.0–0.2)

## 2018-10-09 LAB — MAGNESIUM: Magnesium: 2.1 mg/dL (ref 1.7–2.4)

## 2018-10-09 LAB — GLUCOSE, CAPILLARY
Glucose-Capillary: 185 mg/dL — ABNORMAL HIGH (ref 70–99)
Glucose-Capillary: 266 mg/dL — ABNORMAL HIGH (ref 70–99)
Glucose-Capillary: 272 mg/dL — ABNORMAL HIGH (ref 70–99)
Glucose-Capillary: 220 mg/dL — ABNORMAL HIGH (ref 70–99)

## 2018-10-09 MED ORDER — FREE WATER
200.0000 mL | Freq: Four times a day (QID) | Status: DC
  Administered 2018-10-09 – 2018-10-13 (×15): 200 mL

## 2018-10-09 MED ORDER — SODIUM CHLORIDE 0.9 % IV SOLN
2.0000 g | Freq: Two times a day (BID) | INTRAVENOUS | Status: DC
  Administered 2018-10-10 – 2018-10-11 (×3): 2 g via INTRAVENOUS
  Filled 2018-10-09 (×5): qty 2

## 2018-10-09 MED ORDER — SODIUM CHLORIDE 0.9 % IV SOLN
400.0000 mg | Freq: Once | INTRAVENOUS | Status: AC
  Administered 2018-10-09: 400 mg via INTRAVENOUS
  Filled 2018-10-09: qty 4

## 2018-10-09 MED ORDER — METOPROLOL TARTRATE 5 MG/5ML IV SOLN
5.0000 mg | Freq: Once | INTRAVENOUS | Status: AC
  Administered 2018-10-09: 5 mg via INTRAVENOUS
  Filled 2018-10-09: qty 5

## 2018-10-09 MED ORDER — SODIUM CHLORIDE 0.9 % IV BOLUS
250.0000 mL | Freq: Once | INTRAVENOUS | Status: AC
  Administered 2018-10-09: 250 mL via INTRAVENOUS

## 2018-10-09 MED ORDER — METOPROLOL TARTRATE 25 MG/10 ML ORAL SUSPENSION
50.0000 mg | Freq: Two times a day (BID) | ORAL | Status: DC
  Administered 2018-10-09 – 2018-10-16 (×12): 50 mg
  Filled 2018-10-09 (×14): qty 20

## 2018-10-09 NOTE — Consult Note (Signed)
Consultation Note Date: 10/09/2018   Patient Name: Jordan Reed  DOB: 10/18/1950  MRN: [id number]  Age / Sex: 68 y.o., female  PCP: Patient, No Pcp Per Referring Physician: Antonieta Pert, MD  Reason for Consultation: Establishing goals of care  HPI/Patient Profile: 68 y.o. female  with past medical history of dementia/Alzheimer's, diabetes, HTN, hyperthyroid, anemia, former long term smoker admitted on 10/02/2018 after found slumped on porch and EMS (significant other is caregiver and had been hospitalized x 4 days at this time reportedly) called she was found to be in SVT and cardioverted. Required intubation (extubated 10/04/18) and with AKI with rhabdomyolysis and severe dehydration. Has had atrial fibrillation with RVR and continues with fever and concern for aspiration. Continues to fail swallow study and high risk aspiration.     Clinical Assessment and Goals of Care: I met today at Ms. Reddix' bedside with her RN. She is somnolent but does tell me "hi." RN reports that she was a little more alert this morning. She is not very responsive and does not open her eyes. She does not really move or follow commands. She moans and grimaces to even light palpation to abd. RN bladder scan while I was at bedside so foley catheter was ordered (hopefully this will assist with abd tenderness).   She appears to be resting comfortably unless you palpate abd but then she also immediately returns to a restful state. Not alert enough for intake and high risk of aspiration. I attempted to speak with daughters but no answer. I left a voicemail for daughter Margreta Journey. Hopeful to learn more about Ms. Brunson' baseline and family expectations and wishes. Especially need to discuss artificial feeding as I fear that Ms. Mcnulty' will continue to struggle with adequate nutrition and hydration.   Will continue to reach out to family. Per notes  there was question of her significant other being her HCPOA but no documents available and he is reportedly hospitalized himself. In the absence of documented HCPOA and absence of spouse her adult children are her surrogate decision makers.   Primary Decision Maker Daughters (see above)    SUMMARY OF RECOMMENDATIONS   - Will continue to reach out to children for [place] discussion  Code Status/Advance Care Planning:  DNR - already in place   Symptom Management:   Abd pain: CT done yesterday with report of distended urinary bladder and possible urinary obstruction. Also with worse renal function this am. Foley to be placed and hope this can help with tenderness. Attending working up further.   Palliative Prophylaxis:   Aspiration, Delirium Protocol, Frequent Pain Assessment, Oral Care and Turn Reposition  Psycho-social/Spiritual:   Desire for further Chaplaincy support:no  Additional Recommendations: Caregiving  Support/Resources  Prognosis:   Overall poor prognosis with dementia (unclear how advanced this was prior to admission) and poor progress after acute illness. Prognosis likely poor but somewhat dependent on aggressiveness of care desired.   Discharge Planning: To Be Determined      Primary Diagnoses: Present on Admission: .  Rhabdomyolysis   I have reviewed the medical record, interviewed the patient and family, and examined the patient. The following aspects are pertinent.  History reviewed. No pertinent past medical history. Social History   Socioeconomic History  . Marital status: Married    Spouse name: Not on file  . Number of children: Not on file  . Years of education: Not on file  . Highest education level: Not on file  Occupational History  . Not on file  Social Needs  . Financial resource strain: Not on file  . Food insecurity:    Worry: Not on file    Inability: Not on file  . Transportation needs:    Medical: Not on file    Non-medical: Not  on file  Tobacco Use  . Smoking status: Not on file  Substance and Sexual Activity  . Alcohol use: Not on file  . Drug use: Not on file  . Sexual activity: Not on file  Lifestyle  . Physical activity:    Days per week: Not on file    Minutes per session: Not on file  . Stress: Not on file  Relationships  . Social connections:    Talks on phone: Not on file    Gets together: Not on file    Attends religious service: Not on file    Active member of club or organization: Not on file    Attends meetings of clubs or organizations: Not on file    Relationship status: Not on file  Other Topics Concern  . Not on file  Social History Narrative  . Not on file   No family history on file. Scheduled Meds: . apixaban  5 mg Oral BID  . chlorhexidine gluconate (MEDLINE KIT)  15 mL Mouth Rinse BID  . diltiazem  30 mg Per Tube Q6H  . feeding supplement (OSMOLITE 1.2 CAL)  237 mL Per Tube 5 X Daily  . feeding supplement (PRO-STAT SUGAR FREE 64)  30 mL Per Tube TID  . free water  200 mL Per Tube Q6H  . Gerhardt's butt cream   Topical BID  . insulin aspart  0-15 Units Subcutaneous Q4H  . mouth rinse  15 mL Mouth Rinse BID  . metoprolol tartrate  50 mg Per Tube BID  . multivitamin  15 mL Oral Daily  . sodium chloride flush  3 mL Intravenous Once   Continuous Infusions: . sodium chloride 75 mL/hr at 10/09/18 1200  . cefTAZidime (FORTAZ)  IV 2 g (10/09/18 0524)  . metronidazole 500 mg (10/09/18 0958)   PRN Meds:.acetaminophen (TYLENOL) oral liquid 160 mg/5 mL, diltiazem Allergies  Allergen Reactions  . Codeine Hives and Itching  . Penicillins Rash   Review of Systems  Unable to perform ROS: Dementia    Physical Exam Vitals signs and nursing note reviewed.  Constitutional:      Appearance: She is ill-appearing.  Cardiovascular:     Rate and Rhythm: Regular rhythm. Tachycardia present.  Pulmonary:     Effort: Pulmonary effort is normal. No tachypnea, accessory muscle usage or  respiratory distress.  Abdominal:     Palpations: Abdomen is soft.     Tenderness: There is abdominal tenderness in the suprapubic area.  Skin:    General: Skin is warm.     Comments: Drug rash noted to trunk and extremities  Neurological:     Comments: Somnolent      Vital Signs: BP (!) 106/53 (BP Location: Left Arm)   Pulse Marland Kitchen)  106   Temp 98.6 F (37 C) (Oral)   Resp (!) 34   Ht '5\' 5"'$  (1.651 m)   Wt 80.1 kg   SpO2 96%   BMI 29.39 kg/m  Pain Scale: Faces       SpO2: SpO2: 96 % O2 Device:SpO2: 96 % O2 Flow Rate: .O2 Flow Rate (L/min): 2 L/min  IO: Intake/output summary:   Intake/Output Summary (Last 24 hours) at 10/09/2018 1352 Last data filed at 10/09/2018 1200 Gross per 24 hour  Intake 2148.85 ml  Output 1225 ml  Net 923.85 ml    LBM: Last BM Date: 10/09/18 Baseline Weight: Weight: 77.1 kg Most recent weight: Weight: 80.1 kg     Palliative Assessment/Data:     Time In: 1355 Time Out: 1430 Time Total: 35 min Greater than 50%  of this time was spent counseling and coordinating care related to the above assessment and plan.  Signed by: Vinie Sill, NP Palliative Medicine Team Pager # [phone] (M-F 8a-5p) Team Phone # [phone] (Nights/Weekends)

## 2018-10-09 NOTE — Progress Notes (Signed)
PROGRESS NOTE    Jordan Reed  JWJ:[id number] DOB: January 08, 1951 DOA: 10/02/2018 PCP: Patient, No Pcp Per   Brief Narrative: Per HPI: 68 year old female with advanced dementia, former long-term smoker, diabetes, hypothyroidismshe has no medical records with [place] or in care everywhere.she lives with her boyfriend who is also a caregiver who was unfortunately hospitalized 4 days prior to her admission, she was found slumped down in her front porch by neighbors, subsequently EMS was alerted she was found to be somnolent, noted to be in SVT, cardioverted  -In ED she remained tachycardic, narrow complex subsequently became agitated in the emergency room required sedation and subsequently intubation for airway protection, also had low grade temp of 100. -Laboratory evaluation consistent with dehydration rhabdomyolysis and therefore IV fluid resuscitation was given. Patient admitted to ICU for hypoxemic respiratory failure as well as metabolic encephalopathy related to AKI with rhabdomyolysis. She is also noted to have atrial fibrillation with RVR for which she was started on heparin drip as well as amiodarone.  Patient has since converted to sinus tachycardia and amiodarone has been discontinued.    On admission to ICU, noted to have fever overnight on 4/16 for which she was initially given vancomycin as well as Zosyn.  Vancomycin has been discontinued and patient remains on Zosyn with sputum cultures demonstrating gram-negative rods.  She continues to have persistent encephalopathy with some facial droop, MRI negative for CVA.  -4/17: extubated on 4/17 -4/18: transferred from PCCM to [facility] service -4/19 developed diffuse erythematous rash, likley drug rash, Zosyn was discontinued, she was started on levofloxacin instead -4/20: worsening fever to 101, CXR with bibasilar opacities concerning for Asp PNA -4/21: abx adjusted to ceftazidime and Flagyl. Ct ABD lower chest with small bilateral pleural effusion and  associated atelectasis or consolidation, no acute abdominal findings   Subjective: Patient is alert awake follows minimal commands.  Per nursing no agitation, on 2 L nasal cannula oxygen.  T-max one 1.8 last night  Assessment & Plan:  Acute respiratory failure with hypoxia, extubated 4/17: on 2l [place] currently. Now w aspiration pneumonia, on ceftazidime and Flagyl, on supplemental oxygen 2 L.  Monitor closely, CT abdomen shows lower chest with small bilateral pleural effusion with associated consolidation/atelectasis.  Wean oxygen as tolerated, continue pulmonary toileting.  Aspiration pneumonia/fever: Was admitted to ICU with low-grade fever initially along with agitation SVT.Started on IV Zosyn in the ICU, sputum cultures grew stenotrophomonas maltophilia of questionable significance, had severe drug rash and Zosyn was discontinued 4/21 and started on ceftazidime/Flagyl since 4/21.  Leukocytosis up at 13.9k from 12. Repeat x-ray 4/20 shows bibasilar opacities concerning for aspiration. CT 4/21 w lower chest showing Small bilateral pleural effusion with associated atelectasis/consolidation. We will continue current antibiotics, continue aspiration precaution.Patient remains tachypneic and tachycardic.Speech following closely: Unsafe for p.o yet.  Drug rash: Still has erythematous rash involving two thirds of her body mostly in the abdomen arms thighs distal legs.monitor. No break/ulcer.   AKI: creat up at 1.39. initially w rhabdo that resolved. We will cont on NSS IVF and supportive care.  Avoid nephrotoxic medication. Received ibuprofen last night. Increase her NSS to 100 ml/hr and also free water. Monitor UOP w purewick/prn bladder scan: pt was retaining urine >600 ml, will place foley in. Recent Labs  Lab 10/05/18 0319 10/06/18 0252 10/07/18 0230 10/08/18 0734 10/09/18 0216  CREATININE 0.97 0.90 0.74 0.83 1.39*   PAF: in NSR. Hr was up overnight and was given digoxin, lopressor, ivf, but  was febrile,  likely the reason. TTE with preserved EF, hyperdynamic LV.  c/w Cardizem (new), increase home hemibody.acute CVA metoprolol, Eliquis.  Noted to have suppressed TSH on admission, holding Synthroid and checking TSH in 2 weeks.  Dementia with acute encephalopathy likely metabolic: Patient was more alert awake in am but sleepy later. She did not follow commands.  Per daughter has moderate to severe dementia at baseline on Aricept and Namenda at home and confused part of the day, able to recognize  immediate family members only.  Continue supportive care.  Hypothyroidism history on Synthroid but TSH was less than 0.01.  Holding Synthroid check TSH in 2 weeks.  T2 Diabetes mellitus with hyperglycemia : Controlled.  Hemoglobin C 5.7.  Stable.  Continue sliding scale insulin.  Long-term smoker/suspected COPD.  Continue supportive care.   DVT prophylaxis: eliquis Code Status: DNR. C/s Palliative care for goals of care Family Communication: will call daughter and update. Daughter was updated 4/19, 4/21 previously. Disposition Plan: remains inpatient pending clinical improvement. Needs SNF if improves medically.  Consultants:  PCCM  Procedures:  CT ABD/Pelvis 10/08/18 Lower chest: Small bilateral pleural effusions and associated atelectasis or consolidation, new compared to prior examination, No definite acute CT finding of the abdomen or pelvis to explain abdominal pain. 2. Distended urinary bladder with mild bilateral hydronephrosis. Correlate for urinary obstruction. 3.  Other chronic and incidental findings as detailed above.  Antimicrobials:  Vancomycin 4/17-4/17  Zosyn 4/17->4/21  Levaquin: 4/19>4/21  Ceftazidime/Flagyl: 4/21.>  Anti-infectives (From admission, onward)   Start     Dose/Rate Route Frequency Ordered Stop   10/08/18 1200  cefTAZidime (FORTAZ) 2 g in sodium chloride 0.9 % 100 mL IVPB     2 g 200 mL/hr over 30 Minutes Intravenous Every 8 hours 10/08/18 1045      10/08/18 1000  metroNIDAZOLE (FLAGYL) IVPB 500 mg     500 mg 100 mL/hr over 60 Minutes Intravenous Every 8 hours 10/08/18 0914     10/06/18 2200  levofloxacin (LEVAQUIN) IVPB 750 mg  Status:  Discontinued     750 mg 100 mL/hr over 90 Minutes Intravenous Every 24 hours 10/06/18 2111 10/08/18 0906   10/06/18 0400  vancomycin (VANCOCIN) 1,250 mg in sodium chloride 0.9 % 250 mL IVPB  Status:  Discontinued     1,250 mg 166.7 mL/hr over 90 Minutes Intravenous Every 48 hours 10/04/18 0336 10/04/18 1036   10/04/18 1200  piperacillin-tazobactam (ZOSYN) IVPB 3.375 g  Status:  Discontinued     3.375 g 12.5 mL/hr over 240 Minutes Intravenous Every 8 hours 10/04/18 0332 10/06/18 2111   10/04/18 0345  piperacillin-tazobactam (ZOSYN) IVPB 3.375 g     3.375 g 100 mL/hr over 30 Minutes Intravenous  Once 10/04/18 0332 10/04/18 0421   10/04/18 0345  vancomycin (VANCOCIN) 1,250 mg in sodium chloride 0.9 % 250 mL IVPB     1,250 mg 166.7 mL/hr over 90 Minutes Intravenous  Once 10/04/18 0332 10/04/18 0607   10/02/18 1100  cefTRIAXone (ROCEPHIN) 2 g in sodium chloride 0.9 % 100 mL IVPB     2 g 200 mL/hr over 30 Minutes Intravenous  Once 10/02/18 1051 10/02/18 1144       Objective: Vitals:   10/09/18 0311 10/09/18 0400 10/09/18 0600 10/09/18 0705  BP:  (!) 91/52 (!) 101/51 (!) 106/53  Pulse:  97 (!) 106   Resp:  (!) 34 (!) 34   Temp:    98.4 F (36.9 C)  TempSrc:    Oral  SpO2:  94% 96%   Weight: 80.1 kg     Height:        Intake/Output Summary (Last 24 hours) at 10/09/2018 0936 Last data filed at 10/09/2018 0755 Gross per 24 hour  Intake 1437 ml  Output 975 ml  Net 462 ml   Filed Weights   10/06/18 0500 10/08/18 0500 10/09/18 0311  Weight: 72 kg 76.3 kg 80.1 kg   Weight change: 3.8 kg  Body mass index is 29.39 kg/m.  Intake/Output from previous day: 04/21 0701 - 04/22 0700 In: 8315 [I.V.:300; NG/GT:437; IV Piggyback:700] Out: 675 [Stool:675] Intake/Output this shift: Total I/O In:  -  Out: 300 [Urine:300]  Examination:  General exam: Alert, awake, confused.  Does not follow commands, not in acute distress HEENT:PERRL,Oral mucosa moist, Ear/Nose normal on gross exam Respiratory system: Bilateral equal air entry present diminished at the base Cardiovascular system: S1 & S2 heard,No JVD, murmurs. Gastrointestinal system: Abdomen is  soft,non distended, BS +  Nervous System:Alert and oriented. No focal neurological deficits/moving extremities, sensation intact. Extremities: UE edema +, no clubbing, distal peripheral pulses palpable. Skin: erythematous rash present diffusely abdomen chest upper lip, foot, arms MSK: Normal muscle bulk,tone ,power  Medications:  Scheduled Meds: . apixaban  5 mg Oral BID  . chlorhexidine gluconate (MEDLINE KIT)  15 mL Mouth Rinse BID  . Chlorhexidine Gluconate Cloth  6 each Topical Daily  . diltiazem  30 mg Per Tube Q6H  . feeding supplement (OSMOLITE 1.2 CAL)  237 mL Per Tube 5 X Daily  . feeding supplement (PRO-STAT SUGAR FREE 64)  30 mL Per Tube TID  . free water  200 mL Per Tube Q8H  . Gerhardt's butt cream   Topical BID  . insulin aspart  0-15 Units Subcutaneous Q4H  . mouth rinse  15 mL Mouth Rinse BID  . metoprolol tartrate  25 mg Per Tube BID  . multivitamin  15 mL Oral Daily  . sodium chloride flush  3 mL Intravenous Once   Continuous Infusions: . sodium chloride 75 mL/hr at 10/09/18 0203  . cefTAZidime (FORTAZ)  IV 2 g (10/09/18 0524)  . metronidazole 500 mg (10/09/18 0205)    Data Reviewed: I have personally reviewed following labs and imaging studies  CBC: Recent Labs  Lab 10/02/18 1100  10/06/18 0252 10/07/18 0230 10/07/18 1154 10/08/18 0249 10/09/18 0216  WBC 11.3*   < > 10.2 10.9* 11.7* 12.3* 13.9*  NEUTROABS 9.7*  --   --   --  9.6*  --   --   HGB 14.9   < > 13.2 12.1 13.1 13.4 12.7  HCT 47.5*   < > 39.6 35.0* 39.4 40.8 39.2  MCV 89.0   < > 87.2 84.5 86.6 85.5 87.9  PLT 286   < > 189 170 188 223 268    < > = values in this interval not displayed.   Basic Metabolic Panel: Recent Labs  Lab 10/03/18 0437 10/03/18 1032 10/03/18 1625 10/04/18 0327 10/04/18 1933 10/05/18 0319 10/06/18 0252 10/07/18 0230 10/08/18 0734 10/09/18 0216  NA 152*  --   --  147*  --  144 144 141 144 141  K 3.6  --   --  3.3*  --  3.7 3.6 3.3* 3.9 4.2  CL 123*  --   --  120*  --  118* 112* 112* 110 114*  CO2 15*  --   --  22  --  17* 21* 23 22 19*  GLUCOSE 164*  --   --  148*  --  115* 183* 169* 149* 250*  BUN 71*  --   --  60*  --  37* 25* 17 18 44*  CREATININE 2.10*  --   --  1.23*  --  0.97 0.90 0.74 0.83 1.39*  CALCIUM 8.8*  --   --  9.0  --  9.2 9.6 9.1 9.4 9.7  MG 2.2 2.3 2.2 2.2 2.1  --   --  1.6*  --  2.1  PHOS 4.9* 4.3 3.6 2.4* 2.3*  --   --   --   --   --    GFR: Estimated Creatinine Clearance: 41 mL/min (A) (by C-G formula based on SCr of 1.39 mg/dL (H)). Liver Function Tests: Recent Labs  Lab 10/03/18 1625 10/04/18 0327 10/06/18 0252 10/08/18 0734 10/09/18 0216  AST 49* 44* 31 35 25  ALT 37 34 39 34 33  ALKPHOS 71 67 74 60 70  BILITOT 0.4 0.7 0.9 0.5 0.4  PROT 4.7* 4.4* 5.3* 4.6* 4.6*  ALBUMIN 2.2* 2.0* 2.2* 1.8* 1.7*   No results for input(s): LIPASE, AMYLASE in the last 168 hours. No results for input(s): AMMONIA in the last 168 hours. Coagulation Profile: Recent Labs  Lab 10/02/18 1100  INR 1.2   Cardiac Enzymes: Recent Labs  Lab 10/02/18 1100 10/03/18 0437 10/04/18 0327 10/05/18 0319 10/06/18 0252  CKTOTAL 2,642* 1,594* 692* 353* 114  CKMB 42.8*  --   --   --   --   TROPONINI 0.08*  --   --   --   --    BNP (last 3 results) No results for input(s): PROBNP in the last 8760 hours. HbA1C: Recent Labs    10/08/18 0249  HGBA1C 5.7*   CBG: Recent Labs  Lab 10/08/18 1203 10/08/18 1817 10/08/18 2031 10/08/18 2351 10/09/18 0305  GLUCAP 290* 333* 340* 316* 185*   Lipid Profile: No results for input(s): CHOL, HDL, LDLCALC, TRIG, CHOLHDL, LDLDIRECT in the  last 72 hours. Thyroid Function Tests: No results for input(s): TSH, T4TOTAL, FREET4, T3FREE, THYROIDAB in the last 72 hours. Anemia Panel: No results for input(s): VITAMINB12, FOLATE, FERRITIN, TIBC, IRON, RETICCTPCT in the last 72 hours. Sepsis Labs: Recent Labs  Lab 10/02/18 1100 10/03/18 1125 10/04/18 0327 10/08/18 1040  PROCALCITON  --   --   --  <0.10  LATICACIDVEN 4.5* 1.2 1.2  --     Recent Results (from the past 240 hour(s))  Culture, blood (Routine x 2)     Status: Abnormal   Collection Time: 10/02/18 11:00 AM  Result Value Ref Range Status   Specimen Description BLOOD LEFT ARM  Final   Special Requests   Final    BOTTLES DRAWN AEROBIC AND ANAEROBIC Blood Culture results may not be optimal due to an inadequate volume of blood received in culture bottles   Culture  Setup Time   Final    GRAM POSITIVE RODS CRITICAL RESULT CALLED TO, READ BACK BY AND VERIFIED WITH: RHRMD V BRTK '@0630'$  10/05/18 BY S GEZAHEGN    Culture (A)  Final    CORYNEBACTERIUM, GROUP JK Standardized susceptibility testing for this organism is not available. Performed at [facility], [place] [address], [place], [place] [address]    Report Status 10/07/2018 FINAL  Final  Culture, blood (Routine x 2)     Status: None   Collection Time: 10/02/18 11:03 AM  Result Value Ref Range Status   Specimen Description BLOOD LEFT HAND  Final   Special  Requests   Final    BOTTLES DRAWN AEROBIC AND ANAEROBIC Blood Culture results may not be optimal due to an inadequate volume of blood received in culture bottles   Culture   Final    NO GROWTH 5 DAYS Performed at [facility], [place] [address] [place], [place] [address]    Report Status 10/07/2018 FINAL  Final  MRSA PCR Screening     Status: None   Collection Time: 10/02/18 11:49 PM  Result Value Ref Range Status   MRSA by PCR NEGATIVE NEGATIVE Final    Comment:        The GeneXpert MRSA Assay (FDA approved for NASAL specimens only), is one component  of a comprehensive MRSA colonization surveillance program. It is not intended to diagnose MRSA infection nor to guide or monitor treatment for MRSA infections. Performed at [facility], [place] [address], [place], [place] [address]   Urine Culture     Status: None   Collection Time: 10/03/18 12:11 PM  Result Value Ref Range Status   Specimen Description URINE, RANDOM  Final   Special Requests NONE  Final   Culture   Final    NO GROWTH Performed at [facility], [place] [address] [place], [place] [address]    Report Status 10/05/2018 FINAL  Final  Culture, respiratory (non-expectorated)     Status: None   Collection Time: 10/04/18  3:25 AM  Result Value Ref Range Status   Specimen Description TRACHEAL ASPIRATE  Final   Special Requests NONE  Final   Gram Stain   Final    ABUNDANT WBC PRESENT, PREDOMINANTLY PMN FEW GRAM NEGATIVE RODS Performed at [facility], [place] [address], [place], [place] [address]    Culture MODERATE STENOTROPHOMONAS MALTOPHILIA  Final   Report Status 10/06/2018 FINAL  Final   Organism ID, Bacteria STENOTROPHOMONAS MALTOPHILIA  Final      Susceptibility   Stenotrophomonas maltophilia - MIC*    LEVOFLOXACIN 0.5 SENSITIVE Sensitive     TRIMETH/SULFA 80 RESISTANT Resistant     * MODERATE STENOTROPHOMONAS MALTOPHILIA  Culture, blood (Routine X 2) w Reflex to ID Panel     Status: None (Preliminary result)   Collection Time: 10/04/18  3:48 AM  Result Value Ref Range Status   Specimen Description BLOOD LEFT ANTECUBITAL  Final   Special Requests   Final    BOTTLES DRAWN AEROBIC ONLY Blood Culture results may not be optimal due to an inadequate volume of blood received in culture bottles   Culture   Final    NO GROWTH 4 DAYS Performed at [facility], [place] [address] [place], [place] [address]    Report Status PENDING  Incomplete  Culture, blood (Routine X 2) w Reflex to ID Panel     Status: None (Preliminary result)   Collection Time:  10/04/18  3:48 AM  Result Value Ref Range Status   Specimen Description BLOOD LEFT HAND  Final   Special Requests   Final    BOTTLES DRAWN AEROBIC ONLY Blood Culture results may not be optimal due to an inadequate volume of blood received in culture bottles   Culture   Final    NO GROWTH 4 DAYS Performed at [facility], [place] [address], [place],  [address]    Report Status PENDING  Incomplete  Culture, blood (routine x 2)     Status: None (Preliminary result)   Collection Time: 10/07/18 11:50 AM  Result Value Ref Range Status   Specimen Description BLOOD LEFT HAND  Final   Special Requests   Final    BOTTLES DRAWN AEROBIC ONLY Blood Culture adequate volume   Culture   Final    NO GROWTH < 24 HOURS Performed at [facility], [address], [place], [place] [address]    Report Status PENDING  Incomplete  Culture, blood (routine x 2)     Status: None (Preliminary result)   Collection Time: 10/07/18 11:55 AM  Result Value Ref Range Status   Specimen Description BLOOD LEFT HAND  Final   Special Requests   Final    BOTTLES DRAWN AEROBIC ONLY Blood Culture results may not be optimal due to an inadequate volume of blood received in culture bottles   Culture   Final    NO GROWTH < 24 HOURS Performed at [facility], [place] [address] [place], [place] [address]    Report Status PENDING  Incomplete      Radiology Studies: Ct Abdomen Pelvis W Contrast  Result Date: 10/08/2018 CLINICAL DATA:  Epigastric tenderness EXAM: CT ABDOMEN AND PELVIS WITH CONTRAST TECHNIQUE: Multidetector CT imaging of the abdomen and pelvis was performed using the standard protocol following bolus administration of intravenous contrast. CONTRAST:  176m ISOVUE-300 IOPAMIDOL (ISOVUE-300) INJECTION 61% COMPARISON:  10/02/2018 FINDINGS: Lower chest: Small bilateral pleural effusions and associated atelectasis or consolidation, new compared to prior examination. Coronary artery calcifications.  Hepatobiliary: No focal liver abnormality is seen. Status post cholecystectomy. Dilatation of the common bile duct to approximately 11 mm. Pancreas: Unremarkable. No pancreatic ductal dilatation or surrounding inflammatory changes. Spleen: Normal in size without focal abnormality. Adrenals/Urinary Tract: Adrenal glands are unremarkable. Mild bilateral hydronephrosis. Distended urinary bladder. Stomach/Bowel: Esophagogastric tube with tip and side port below the diaphragm. Extensive pleural material about the greater curvature of stomach. Appendix appears normal. No evidence of bowel wall thickening, distention, or inflammatory changes. Rectal tube. Vascular/Lymphatic: Mixed calcific atherosclerosis. No enlarged abdominal or pelvic lymph nodes. Reproductive: No mass or other abnormality. Other: Small, fat containing epigastric hernia. No abdominopelvic ascites. Musculoskeletal: No acute or significant osseous findings. IMPRESSION: 1. No definite acute CT finding of the abdomen or pelvis to explain abdominal pain. 2. Distended urinary bladder with mild bilateral hydronephrosis. Correlate for urinary obstruction. 3.  Other chronic and incidental findings as detailed above. Electronically Signed   By: AEddie CandleM.D.   On: 10/08/2018 13:27   Dg Chest Port 1 View  Result Date: 10/07/2018 CLINICAL DATA:  68year old female with fever EXAM: PORTABLE CHEST 1 VIEW COMPARISON:  Prior chest x-ray 10/03/2018 FINDINGS: The patient has been extubated. A gastric tube remains in place. The tip of the tube lies off the field of view, presumably within the stomach. New bibasilar airspace opacities which are veil like on the right, and more consolidated on the left with obscuration of the hemidiaphragm. Overall, the inspiratory volumes are lower resulting in crowding of the pulmonary vasculature. However, there is no overt edema. No pneumothorax. No acute osseous abnormality. Surgical changes in the left upper quadrant, right  upper quadrant and right shoulder. IMPRESSION: 1. Lower inspiratory volumes following extubation. 2. New bibasilar airspace opacities favored to reflect a combination of small layering pleural effusions and atelectasis. In the appropriate clinical setting, pneumonia or aspiration would be difficult to exclude radiographically. Electronically Signed   By: HJacqulynn CadetM.D.   On: 10/07/2018 12:58     LOS: 7 days   Time spent:  More than 50% of that time was spent in counseling and/or coordination of care.  Antonieta Pert, MD [facility]  10/09/2018, 9:36 AM

## 2018-10-09 NOTE — Progress Notes (Signed)
Summary-Pt tachycardiac when care assumed, PRN cardizem given per MD parameter- ineffective. At approx 2110, scheduled beta blocker administered, also ineffective. Hospitalist informed, orders rec'd for digoxin and lopressor IV, and a 250 cc bolus at approx 2215. Pt pulse remained elevated above desired parameters, temp spike noted at 101.8 ax. PRN tylenol administered, ice packs applied to axillary region and hospitalist paged. Status update provided, order rec'd for IV ibuprofen and an additional bolus. Pt pulse improved, but remained above intervention parameter. Hospitalist updated, order rec'd for another bolus and beta blocker administration- effective. Pt rate 90's, SR at this time.

## 2018-10-09 NOTE — Progress Notes (Signed)
Inpatient Diabetes Program Recommendations  AACE/ADA: New Consensus Statement on Inpatient Glycemic Control   Target Ranges:  Prepandial:   less than 140 mg/dL      Peak postprandial:   less than 180 mg/dL (1-2 hours)      Critically ill patients:  140 - 180 mg/dL   Results for Jordan Reed, Jordan Reed (MRN [id number]) as of 10/09/2018 10:40  Ref. Range 10/08/2018 04:02 10/08/2018 10:16 10/08/2018 12:03 10/08/2018 18:17 10/08/2018 20:31 10/08/2018 23:51 10/09/2018 03:05  Glucose-Capillary Latest Ref Range: 70 - 99 mg/dL 979 (H)  Novolog 3 units   Novolog 3 units @ 10:16 290 (H)  Novolog 8 units 333 (H)  Novolog 15 units 340 (H)  Novolog 11 units 316 (H)  Novolog 11 units 185 (H)  Novolog 3 units   Review of Glycemic Control  Diabetes history: DM2 Outpatient Diabetes medications: None Current orders for Inpatient glycemic control: Novolog 0-15 units Q4H  Inpatient Diabetes Program Recommendations:   Insulin - Tube Feeding Coverage: Noted hyperglycemia with tube feeding boluses. Please order Novolog 4 units 5xday scheduled with same timing as tube feeding bolus (6:00, 10:00, 14:00, 18:00, 22:00).  Thanks, Orlando Penner, RN, MSN, CDE Diabetes Coordinator Inpatient Diabetes Program [phone] (Team Pager from 8am to 5pm)

## 2018-10-09 NOTE — Progress Notes (Signed)
Physical Therapy Treatment Patient Details Name: Jordan Reed MRN: [id number] DOB: Aug 23, 1950 Today's Date: 10/09/2018    History of Present Illness 68 year old female with no known past medical history. She was found by a car driving past her house noticed her to be slumped over on the front porch on 10/02/2018.  EMS was alerted and upon their arrival she had altered mental status, profoundly tachycardic with a narrow complex rhythm; She was cardioverted twice without effect and remained tachycardic.  Became increasingly agitated, intubated 4/15 for airway protection.  Laboratory evaluation consistent with dehydration rhabdomyolysis, IV fluid resuscitation was given and extubated on 4/17.     PT Comments    Patient sleeping and lethargic this visit, tolerating PROM therex bedside today. Not alert enough to attempt further mobility at this time.    Follow Up Recommendations  SNF;Supervision/Assistance - 24 hour     Equipment Recommendations  Other (comment)    Recommendations for Other Services       Precautions / Restrictions Precautions Precautions: Fall    Mobility  Bed Mobility Overal bed mobility: Needs Assistance Bed Mobility: Rolling;Supine to Sit Rolling: Total assist;+2 for physical assistance         General bed mobility comments: repositioned in bed with total assist +2, attempted transition to EOB but limited initation and pt guarding movement; utilized chair position to improve position in bed   Transfers                    Ambulation/Gait                 Stairs             Wheelchair Mobility    Modified Rankin (Stroke Patients Only)       Balance                                            Cognition Arousal/Alertness: Lethargic Behavior During Therapy: Flat affect Overall Cognitive Status: Difficult to assess                         Following Commands: Follows one step commands  inconsistently   Awareness: Intellectual   General Comments: pt mumbling and responds incoherently throughout sesssion (unable to understand verbalizing her name today); but more alert than previous session       Exercises General Exercises - Upper Extremity Shoulder Flexion: PROM;Both;10 reps;Supine Shoulder Extension: PROM;Both;10 reps;Supine Elbow Flexion: PROM;Both;10 reps;Supine Elbow Extension: PROM;Both;10 reps;Supine Digit Composite Flexion: PROM;Both;10 reps;Supine Composite Extension: PROM;Both;10 reps;Supine General Exercises - Lower Extremity Heel Slides: PROM;10 reps Hip ABduction/ADduction: PROM;10 reps    General Comments        Pertinent Vitals/Pain Pain Assessment: Faces Faces Pain Scale: Hurts a little bit Pain Location: generalized grimacing Pain Descriptors / Indicators: Discomfort;Grimacing;Guarding    Home Living                      Prior Function            PT Goals (current goals can now be found in the care plan section) Acute Rehab PT Goals Patient Stated Goal: unable to state PT Goal Formulation: Patient unable to participate in goal setting Time For Goal Achievement: 10/19/18 Potential to Achieve Goals: Good Progress towards PT goals: Not progressing toward goals - comment  Frequency    Min 2X/week      PT Plan Current plan remains appropriate    Co-evaluation              AM-PAC PT "6 Clicks" Mobility   Outcome Measure  Help needed turning from your back to your side while in a flat bed without using bedrails?: Total Help needed moving from lying on your back to sitting on the side of a flat bed without using bedrails?: Total Help needed moving to and from a bed to a chair (including a wheelchair)?: Total Help needed standing up from a chair using your arms (e.g., wheelchair or bedside chair)?: Total Help needed to walk in hospital room?: Total Help needed climbing 3-5 steps with a railing? : Total 6 Click  Score: 6    End of Session Equipment Utilized During Treatment: Gait belt Activity Tolerance: Patient limited by fatigue Patient left: in bed;with call bell/phone within reach;with nursing/sitter in room Nurse Communication: Mobility status PT Visit Diagnosis: Unsteadiness on feet (R26.81);History of falling (Z91.81);Muscle weakness (generalized) (M62.81);Pain     Time: 1400-1415 PT Time Calculation (min) (ACUTE ONLY): 15 min  Charges:  $Therapeutic Exercise: 8-22 mins                     Etta GrandchildSean Eathel Pajak, PT, DPT Acute Rehabilitation Services Pager: [phone] Office: [phone]     Etta GrandchildSean Levaughn Puccinelli 10/09/2018, 2:08 PM

## 2018-10-09 NOTE — Progress Notes (Signed)
PHARMACY NOTE:  ANTIMICROBIAL RENAL DOSAGE ADJUSTMENT  Current antimicrobial regimen includes a mismatch between antimicrobial dosage and estimated renal function.  As per policy approved by the Pharmacy & Therapeutics and Medical Executive Committees, the antimicrobial dosage will be adjusted accordingly.  Current antimicrobial dosage:  Fortaz 2gm IV Q8H  Indication: Stenotrophomonas PNA  Renal Function:  Estimated Creatinine Clearance: 41 mL/min (A) (by C-G formula based on SCr of 1.39 mg/dL (H)). []      On intermittent HD, scheduled: []      On CRRT    Antimicrobial dosage has been changed to:  Fortaz 2gm IV Q12H  Jordan Reed, PharmD, BCPS, BCCCP 10/09/2018, 9:21 PM

## 2018-10-09 NOTE — Progress Notes (Signed)
Nutrition Follow-up  RD working remotely.  INTERVENTION:   Continue bolus TF regimen via NGT: - Osmolite 1.2 cal 1 can 5 times daily (1185 ml/day) - Pro-stat 30 ml TID - Free water per MD, currently 200 ml q 6 hours  Tube feeding regimen provides1722kcal, 111grams of protein, and 1717m of H2O (100% of needs).  NUTRITION DIAGNOSIS:   Increased nutrient needs related to wound healing as evidenced by estimated needs.  Ongoing, being addressed via TF  GOAL:   Patient will meet greater than or equal to 90% of their needs  Met via TF  MONITOR:   Diet advancement, Labs, Weight trends, TF tolerance, Skin, I & O's  REASON FOR ASSESSMENT:   Consult Assessment of nutrition requirement/status  ASSESSMENT:   Patient with no PMH on file. Presents this admission with ARF from dehydration/rhabdomyolysis.   Per SLP, pt is still not safe for POs. Palliative care consulted for [place]  RD re-consulted to assess pt. Reviewed Diabetes Coordinator notes. Recommending Novolog 4 units 5 x day scheduled with same timing as tube feeding bolus due to hyperglycemia after bolus feeds.  CT abdomen and lower chest on 4/21 revealed small bilateral pleural effusion and associated atelectasis/consolidation, no acute abdominal findings. Pt on antibiotics for aspiration pneumonia.  Weight up 28 lbs since first measured weight on 4/16. Suspect this is related to fluid status. Reviewed RN edema assessment. Pt with generalized moderate pitting edema.  Medications reviewed and include: SSI, liquid MVI, IV abx IVF: NS @ 100 ml/hr  Labs reviewed: BUN 44 (H) CBG's: 266, 185, 316, 340, 333 x 24 hours  Rectal tube: 675 ml x 24 hours I/O's: +13.3 L since admit  Diet Order:   Diet Order            Diet NPO time specified  Diet effective now              EDUCATION NEEDS:   Not appropriate for education at this time  Skin:  Skin Assessment: Skin Integrity Issues: DTI: right ear, right  shoulder, right hip, left hip, right knee, right elbow Stage I: sacrum  Last BM:  10/09/18 675 ml x 24 hours via rectal tube  Height:   Ht Readings from Last 1 Encounters:  10/04/18 '5\' 5"'$  (1.651 m)    Weight:   Wt Readings from Last 1 Encounters:  10/09/18 80.1 kg    Ideal Body Weight:  56.8 kg  BMI:  Body mass index is 29.39 kg/m.  Estimated Nutritional Needs:   Kcal:  1650-1850 kcal  Protein:  90-105 grams  Fluid:  >/= 1.6 L/day     KGaynell Face MS, RD, LDN Inpatient Clinical Dietitian Pager: [phone]/After Hours: [phone]

## 2018-10-09 NOTE — Progress Notes (Signed)
Speech Language Pathology Treatment: Dysphagia  Patient Details Name: Jordan Reed MRN: [id number] DOB: Sep 25, 1950 Today's Date: 10/09/2018 Time: 0920-0935 SLP Time Calculation (min) (ACUTE ONLY): 15 min  Assessment / Plan / Recommendation Clinical Impression  Pt is resistive to oral care efforts and most POs offered, pursing her lips during attempts. SLP provided different bolus textures and delivery methods. Thin liquids were swallowed with facial gimacing, multiple swallows, and concern for aspiration given coughing and wet vocal quality. Purees were orally held until SLP suctioned them out. She is still not ready for a PO diet. Unclear what her baseline function was PTA, but will need to see further improvements in mentation and swallow function prior to being ready for POs.   HPI HPI: Patient with unknown past medical health. Found on porch slumped over. In ED she was intubated for airway protection, dehydration and rhabdomyolysis. She was extubated after 3 days on 10/04/18. She has no known history of swallowing difficulties.       SLP Plan  Continue with current plan of care       Recommendations  Diet recommendations: NPO Medication Administration: Via alternative means                Oral Care Recommendations: Oral care QID Follow up Recommendations: Skilled Nursing facility SLP Visit Diagnosis: Dysphagia, unspecified (R13.10) Plan: Continue with current plan of care       GO                Virl Axe Wakeelah Solan 10/09/2018, 9:41 AM  Ivar Drape, M.A. CCC-SLP Acute Herbalist [phone] Office [phone]

## 2018-10-10 DIAGNOSIS — Z7189 Other specified counseling: Secondary | ICD-10-CM

## 2018-10-10 LAB — CBC
HCT: 34.6 % — ABNORMAL LOW (ref 36.0–46.0)
Hemoglobin: 11.3 g/dL — ABNORMAL LOW (ref 12.0–15.0)
MCH: 28.8 pg (ref 26.0–34.0)
MCHC: 32.7 g/dL (ref 30.0–36.0)
MCV: 88.3 fL (ref 80.0–100.0)
Platelets: 214 10*3/uL (ref 150–400)
RBC: 3.92 MIL/uL (ref 3.87–5.11)
RDW: 15.2 % (ref 11.5–15.5)
WBC: 11.4 10*3/uL — ABNORMAL HIGH (ref 4.0–10.5)
nRBC: 0 % (ref 0.0–0.2)

## 2018-10-10 LAB — GLUCOSE, CAPILLARY
Glucose-Capillary: 157 mg/dL — ABNORMAL HIGH (ref 70–99)
Glucose-Capillary: 149 mg/dL — ABNORMAL HIGH (ref 70–99)
Glucose-Capillary: 169 mg/dL — ABNORMAL HIGH (ref 70–99)
Glucose-Capillary: 206 mg/dL — ABNORMAL HIGH (ref 70–99)
Glucose-Capillary: 202 mg/dL — ABNORMAL HIGH (ref 70–99)
Glucose-Capillary: 148 mg/dL — ABNORMAL HIGH (ref 70–99)

## 2018-10-10 LAB — BASIC METABOLIC PANEL
Anion gap: 7 (ref 5–15)
BUN: 53 mg/dL — ABNORMAL HIGH (ref 8–23)
CO2: 20 mmol/L — ABNORMAL LOW (ref 22–32)
Calcium: 9.3 mg/dL (ref 8.9–10.3)
Chloride: 115 mmol/L — ABNORMAL HIGH (ref 98–111)
Creatinine, Ser: 1.43 mg/dL — ABNORMAL HIGH (ref 0.44–1.00)
GFR calc Af Amer: 44 mL/min — ABNORMAL LOW (ref >60)
GFR calc non Af Amer: 38 mL/min — ABNORMAL LOW (ref >60)
Glucose, Bld: 215 mg/dL — ABNORMAL HIGH (ref 70–99)
Potassium: 3.9 mmol/L (ref 3.5–5.1)
Sodium: 142 mmol/L (ref 135–145)

## 2018-10-10 MED ORDER — INSULIN ASPART 100 UNIT/ML ~~LOC~~ SOLN
3.0000 [IU] | Freq: Every day | SUBCUTANEOUS | Status: DC
  Administered 2018-10-10 – 2018-10-16 (×27): 3 [IU] via SUBCUTANEOUS

## 2018-10-10 MED ORDER — INSULIN ASPART 100 UNIT/ML ~~LOC~~ SOLN
0.0000 [IU] | Freq: Three times a day (TID) | SUBCUTANEOUS | Status: DC
  Administered 2018-10-10 – 2018-10-11 (×2): 3 [IU] via SUBCUTANEOUS
  Administered 2018-10-11 – 2018-10-13 (×5): 2 [IU] via SUBCUTANEOUS
  Administered 2018-10-13: 12:00:00 3 [IU] via SUBCUTANEOUS
  Administered 2018-10-14: 06:00:00 1 [IU] via SUBCUTANEOUS
  Administered 2018-10-15 (×3): 3 [IU] via SUBCUTANEOUS

## 2018-10-10 NOTE — Progress Notes (Signed)
Noted Diabetic Coordinator recommendations for Novolog administration to be coordinated w/tube feeding boluses.  Discussed this w/Dr. Jonathon Bellows, attending.  Dr. Jonathon Bellows prefers to continue w/SSI q4hrs instead of coordinating w/tube feeding boluses.  No change to either order at this time.

## 2018-10-10 NOTE — Progress Notes (Signed)
OT Cancellation Note  Patient Details Name: Jordan Reed MRN: [id number] DOB: 07-08-1950   Cancelled Treatment:    Reason Eval/Treat Not Completed: Fatigue/lethargy limiting ability to participate;Other (comment)(Pt not following commands, lethargic)  Evern Bio [facility] 10/10/2018, 3:33 PM   Sherryl Manges OTR/L Acute Rehabilitation Services Pager: [phone] Office: [phone]

## 2018-10-10 NOTE — Progress Notes (Signed)
Inpatient Diabetes Program Recommendations  AACE/ADA: New Consensus Statement on Inpatient Glycemic Control  Target Ranges:  Prepandial:   less than 140 mg/dL      Peak postprandial:   less than 180 mg/dL (1-2 hours)      Critically ill patients:  140 - 180 mg/dL   Results for CHANNEL, TEE (MRN [id number]) as of 10/10/2018 10:09  Ref. Range 10/09/2018 03:05 10/09/2018 11:37 10/09/2018 16:49 10/09/2018 20:12 10/10/2018 01:03 10/10/2018 04:36 10/10/2018 08:03  Glucose-Capillary Latest Ref Range: 70 - 99 mg/dL 021 (H)  Novolog 3 units 266 (H)  Novolog 8 units  272 (H)  Novolog 8 units 220 (H)  Novolog 5 units 157 (H)  Novolog 3 units 149 (H)  Novolog 2 units 169 (H)  Novolog 3 units   Review of Glycemic Control  Diabetes history: DM2 Outpatient Diabetes medications: None Current orders for Inpatient glycemic control: Novolog 0-15 units Q4H  Inpatient Diabetes Program Recommendations:   Insulin - Tube Feeding Coverage: Noted hyperglycemia with tube feeding boluses. Please order Novolog 4 units 5xday scheduled with same timing as tube feeding bolus (6:00, 10:00, 14:00, 18:00, 22:00).  Thanks, Orlando Penner, RN, MSN, CDE Diabetes Coordinator Inpatient Diabetes Program [phone] (Team Pager from 8am to 5pm)

## 2018-10-10 NOTE — Progress Notes (Signed)
PROGRESS NOTE    Jordan Reed  IRS:[id number] DOB: 1950-07-07 DOA: 10/02/2018 PCP: Patient, No Pcp Per   Brief Narrative: Per HPI: 68 year old female with advanced dementia, former long-term smoker, diabetes, hypothyroidismshe has no medical records with [place] or in care everywhere.she lives with her boyfriend who is also a caregiver who was unfortunately hospitalized 4 days prior to her admission, she was found slumped down in her front porch by neighbors, subsequently EMS was alerted she was found to be somnolent, noted to be in SVT, cardioverted  -In ED she remained tachycardic, narrow complex subsequently became agitated in the emergency room required sedation and subsequently intubation for airway protection, also had low grade temp of 100. -Laboratory evaluation consistent with dehydration rhabdomyolysis and therefore IV fluid resuscitation was given. Patient admitted to ICU for hypoxemic respiratory failure as well as metabolic encephalopathy related to AKI with rhabdomyolysis. She is also noted to have atrial fibrillation with RVR for which she was started on heparin drip as well as amiodarone.  Patient has since converted to sinus tachycardia and amiodarone has been discontinued.    On admission to ICU, noted to have fever overnight on 4/16 for which she was initially given vancomycin as well as Zosyn.  Vancomycin has been discontinued and patient remains on Zosyn with sputum cultures demonstrating gram-negative rods.  She continues to have persistent encephalopathy with some facial droop, MRI negative for CVA.  -4/17: extubated on 4/17 -4/18: transferred from PCCM to [facility] service -4/19 developed diffuse erythematous rash, likley drug rash, Zosyn was discontinued, she was started on levofloxacin instead -4/20: worsening fever to 101, CXR with bibasilar opacities concerning for Asp PNA -4/21: abx adjusted to ceftazidime and Flagyl. Ct ABD lower chest with small bilateral pleural effusion and  associated atelectasis or consolidation, no acute abdominal findings   Subjective: This morning patient is alert awake confused mumbling words difficult to interpret.  Has been lethargic most of the day yesterday.  Did not spike fever yesterday.  Assessment & Plan:  Acute respiratory failure with hypoxia.  Initially intubated for airway protection, extubated 4/17 and with aspiration pneumonia: Currently on 2 L nasal cannula. Patient has been tachycardic tachypneic. C/W [place], pulmonary toileting supportive care, aspiration precaution.  CT abdomen 4/21 shows lower chest with small bilateral pleural effusion with associated consolidation/atelectasis.   Aspiration pneumonia/fever: Was admitted to ICU with low-grade fever initially along with agitation SVT.Started on IV Zosyn in the ICU, sputum cultures grew stenotrophomonas maltophilia of questionable significance, had severe drug rash and Zosyn was discontinued 4/21 and started on ceftazidime/Flagyl since 4/21. Repeat x-ray 4/20 shows bibasilar opacities concerning for aspiration. CT 4/21 w lower chest showing Small bilateral pleural effusion with associated atelectasis/consolidation.Cont on ceftazidime/Flagyl.  She hasn't had any spike in  fever more than 24 hours. continue aspiration precaution, NGT, suctioning positioning.  Drug rash: Still has erythematous rash involving two thirds of her body mostly in the abdomen arms thighs distal legs.monitor. No break/ulcer.   AKI: creat up at 1.39_1.55. initially w rhabdo that resolved. We will cont on NSS IVF, free water and supportive care.  Avoid nephrotoxic medication. Received ibuprofen last night. Had purewick and on bladder scan: pt was retaining urine >600 ml- foley placed 4/22. UOP 2550, cut down fluids. . Recent Labs  Lab 10/06/18 0252 10/07/18 0230 10/08/18 0734 10/09/18 0216 10/10/18 0234  CREATININE 0.90 0.74 0.83 1.39* 1.43*   PAF: in NSR. HR in 110s.TTE with preserved EF, hyperdynamic LV.   c/w Cardizem (new), increased  home metoprolol to 50 mg 4/22- bp IN LOW 100s limiting further increase on the medication but will monitor closely and reassess. Cont Eliquis.Noted to have suppressed TSH on admission, holding Synthroid and checking TSH in 2 weeks.  Dementia with acute encephalopathy likely metabolic: Patient was more alert awake in am but sleepy later. She did not follow commands.  Per daughter has moderate to severe dementia at baseline on Aricept and Namenda at home and confused part of the day, able to recognize  immediate family members only.  Continue supportive care.  Hypothyroidism history on Synthroid but TSH was less than 0.01.  Holding Synthroid check TSH in 2 weeks.  T2 Diabetes mellitus with hyperglycemia : Controlled.  Hemoglobin C 5.7.  Stable.  Continue sliding scale insulin.  Long-term smoker/suspected COPD.  Continue supportive care.  DVT prophylaxis: eliquis Code Status: DNR.  Palliative care on board for goals of care Family Communication: Discussed with the palliative care, who has spoken with patient's daughter who understands that patient may not improve, if she does improve plan is for SNF if not may need to look into hospice, daughter has verbalized understanding as per palliative care discussion.   Disposition Plan: remains inpatient pending clinical improvement. Needs SNF if improves medically.  Consultants:  PCCM Palliative care medicine  Procedures:  CT ABD/Pelvis 10/08/18 Lower chest: Small bilateral pleural effusions and associated atelectasis or consolidation, new compared to prior examination, No definite acute CT finding of the abdomen or pelvis to explain abdominal pain. 2. Distended urinary bladder with mild bilateral hydronephrosis. Correlate for urinary obstruction. 3.  Other chronic and incidental findings as detailed above.  Antimicrobials:  Vancomycin 4/17-4/17  Zosyn 4/17->4/21  Levaquin: 4/19>4/21  Ceftazidime/Flagyl: 4/21.>   Anti-infectives (From admission, onward)   Start     Dose/Rate Route Frequency Ordered Stop   10/10/18 0200  cefTAZidime (FORTAZ) 2 g in sodium chloride 0.9 % 100 mL IVPB     2 g 200 mL/hr over 30 Minutes Intravenous Every 12 hours 10/09/18 2122     10/08/18 1200  cefTAZidime (FORTAZ) 2 g in sodium chloride 0.9 % 100 mL IVPB  Status:  Discontinued     2 g 200 mL/hr over 30 Minutes Intravenous Every 8 hours 10/08/18 1045 10/09/18 2122   10/08/18 1000  metroNIDAZOLE (FLAGYL) IVPB 500 mg     500 mg 100 mL/hr over 60 Minutes Intravenous Every 8 hours 10/08/18 0914     10/06/18 2200  levofloxacin (LEVAQUIN) IVPB 750 mg  Status:  Discontinued     750 mg 100 mL/hr over 90 Minutes Intravenous Every 24 hours 10/06/18 2111 10/08/18 0906   10/06/18 0400  vancomycin (VANCOCIN) 1,250 mg in sodium chloride 0.9 % 250 mL IVPB  Status:  Discontinued     1,250 mg 166.7 mL/hr over 90 Minutes Intravenous Every 48 hours 10/04/18 0336 10/04/18 1036   10/04/18 1200  piperacillin-tazobactam (ZOSYN) IVPB 3.375 g  Status:  Discontinued     3.375 g 12.5 mL/hr over 240 Minutes Intravenous Every 8 hours 10/04/18 0332 10/06/18 2111   10/04/18 0345  piperacillin-tazobactam (ZOSYN) IVPB 3.375 g     3.375 g 100 mL/hr over 30 Minutes Intravenous  Once 10/04/18 0332 10/04/18 0421   10/04/18 0345  vancomycin (VANCOCIN) 1,250 mg in sodium chloride 0.9 % 250 mL IVPB     1,250 mg 166.7 mL/hr over 90 Minutes Intravenous  Once 10/04/18 0332 10/04/18 0607   10/02/18 1100  cefTRIAXone (ROCEPHIN) 2 g in sodium chloride 0.9 %  100 mL IVPB     2 g 200 mL/hr over 30 Minutes Intravenous  Once 10/02/18 1051 10/02/18 1144       Objective: Vitals:   10/09/18 2013 10/09/18 2300 10/09/18 2328 10/10/18 0407  BP: (!) 104/51  (!) 109/56 109/61  Pulse: (!) 112  (!) 118   Resp: (!) 27 (!) 24 (!) 24   Temp:   99.4 F (37.4 C) 100.1 F (37.8 C)  TempSrc: Axillary  Oral Oral  SpO2: 98%  100%   Weight:    79.8 kg  Height:         Intake/Output Summary (Last 24 hours) at 10/10/2018 1146 Last data filed at 10/10/2018 0939 Gross per 24 hour  Intake 1715.79 ml  Output 2000 ml  Net -284.21 ml   Filed Weights   10/08/18 0500 10/09/18 0311 10/10/18 0407  Weight: 76.3 kg 80.1 kg 79.8 kg   Weight change: -0.3 kg  Body mass index is 29.28 kg/m.  Intake/Output from previous day: 04/22 0701 - 04/23 0700 In: 1965.7 [I.V.:857; NG/GT:711; IV Piggyback:397.7] Out: 2550 [Urine:2550] Intake/Output this shift: Total I/O In: 237 [NG/GT:237] Out: -   Examination:  General exam: Weak, frail appears alert awake, mumbling words and not following commands HEENT:PERRL,Oral mucosa moist, Ear/Nose normal on gross exam Respiratory system: Bilateral equal air entry present with crackly sound coming mostly from upper airways- RN for getting ready for suctioning. Cardiovascular system: S1 & S2 heard,No JVD, murmurs. Gastrointestinal system: Abdomen is  soft,non distended, BS +  Nervous System: Weak and lethargic but appears awake Extremities: UE edema+ distal peripheral pulses palpable. Skin: Erythematous rash present abdomen uppercase upper arm and lower extremities-more or less the same MSK: Normal muscle bulk,tone ,power  Medications:  Scheduled Meds: . apixaban  5 mg Oral BID  . chlorhexidine gluconate (MEDLINE KIT)  15 mL Mouth Rinse BID  . diltiazem  30 mg Per Tube Q6H  . feeding supplement (OSMOLITE 1.2 CAL)  237 mL Per Tube 5 X Daily  . feeding supplement (PRO-STAT SUGAR FREE 64)  30 mL Per Tube TID  . free water  200 mL Per Tube Q6H  . Gerhardt's butt cream   Topical BID  . insulin aspart  0-15 Units Subcutaneous Q4H  . mouth rinse  15 mL Mouth Rinse BID  . metoprolol tartrate  50 mg Per Tube BID  . multivitamin  15 mL Oral Daily  . sodium chloride flush  3 mL Intravenous Once   Continuous Infusions: . sodium chloride 100 mL/hr at 10/10/18 0520  . cefTAZidime (FORTAZ)  IV 2 g (10/10/18 0118)  . metronidazole  500 mg (10/10/18 0959)    Data Reviewed: I have personally reviewed following labs and imaging studies  CBC: Recent Labs  Lab 10/07/18 0230 10/07/18 1154 10/08/18 0249 10/09/18 0216 10/10/18 0234  WBC 10.9* 11.7* 12.3* 13.9* 11.4*  NEUTROABS  --  9.6*  --   --   --   HGB 12.1 13.1 13.4 12.7 11.3*  HCT 35.0* 39.4 40.8 39.2 34.6*  MCV 84.5 86.6 85.5 87.9 88.3  PLT 170 188 223 268 496   Basic Metabolic Panel: Recent Labs  Lab 10/03/18 1625 10/04/18 0327 10/04/18 1933  10/06/18 0252 10/07/18 0230 10/08/18 0734 10/09/18 0216 10/10/18 0234  NA  --  147*  --    < > 144 141 144 141 142  K  --  3.3*  --    < > 3.6 3.3* 3.9 4.2 3.9  CL  --  120*  --    < > 112* 112* 110 114* 115*  CO2  --  22  --    < > 21* 23 22 19* 20*  GLUCOSE  --  148*  --    < > 183* 169* 149* 250* 215*  BUN  --  60*  --    < > 25* 17 18 44* 53*  CREATININE  --  1.23*  --    < > 0.90 0.74 0.83 1.39* 1.43*  CALCIUM  --  9.0  --    < > 9.6 9.1 9.4 9.7 9.3  MG 2.2 2.2 2.1  --   --  1.6*  --  2.1  --   PHOS 3.6 2.4* 2.3*  --   --   --   --   --   --    < > = values in this interval not displayed.   GFR: Estimated Creatinine Clearance: 39.8 mL/min (A) (by C-G formula based on SCr of 1.43 mg/dL (H)). Liver Function Tests: Recent Labs  Lab 10/03/18 1625 10/04/18 0327 10/06/18 0252 10/08/18 0734 10/09/18 0216  AST 49* 44* 31 35 25  ALT 37 34 39 34 33  ALKPHOS 71 67 74 60 70  BILITOT 0.4 0.7 0.9 0.5 0.4  PROT 4.7* 4.4* 5.3* 4.6* 4.6*  ALBUMIN 2.2* 2.0* 2.2* 1.8* 1.7*   No results for input(s): LIPASE, AMYLASE in the last 168 hours. No results for input(s): AMMONIA in the last 168 hours. Coagulation Profile: No results for input(s): INR, PROTIME in the last 168 hours. Cardiac Enzymes: Recent Labs  Lab 10/04/18 0327 10/05/18 0319 10/06/18 0252  CKTOTAL 692* 353* 114   BNP (last 3 results) No results for input(s): PROBNP in the last 8760 hours. HbA1C: Recent Labs    10/08/18 0249  HGBA1C  5.7*   CBG: Recent Labs  Lab 10/09/18 1649 10/09/18 2012 10/10/18 0103 10/10/18 0436 10/10/18 0803  GLUCAP 272* 220* 157* 149* 169*   Lipid Profile: No results for input(s): CHOL, HDL, LDLCALC, TRIG, CHOLHDL, LDLDIRECT in the last 72 hours. Thyroid Function Tests: No results for input(s): TSH, T4TOTAL, FREET4, T3FREE, THYROIDAB in the last 72 hours. Anemia Panel: No results for input(s): VITAMINB12, FOLATE, FERRITIN, TIBC, IRON, RETICCTPCT in the last 72 hours. Sepsis Labs: Recent Labs  Lab 10/04/18 0327 10/08/18 1040  PROCALCITON  --  <0.10  LATICACIDVEN 1.2  --     Recent Results (from the past 240 hour(s))  Culture, blood (Routine x 2)     Status: Abnormal   Collection Time: 10/02/18 11:00 AM  Result Value Ref Range Status   Specimen Description BLOOD LEFT ARM  Final   Special Requests   Final    BOTTLES DRAWN AEROBIC AND ANAEROBIC Blood Culture results may not be optimal due to an inadequate volume of blood received in culture bottles   Culture  Setup Time   Final    GRAM POSITIVE RODS CRITICAL RESULT CALLED TO, READ BACK BY AND VERIFIED WITH: RHRMD V BRTK _0  10/05/18 BY S GEZAHEGN    Culture (A)  Final    CORYNEBACTERIUM, GROUP JK Standardized susceptibility testing for this organism is not available. Performed at [facility], [place] [address] [place], [place] [address]    Report Status 10/07/2018 FINAL  Final  Culture, blood (Routine x 2)     Status: None   Collection Time: 10/02/18 11:03 AM  Result Value Ref Range Status   Specimen Description BLOOD LEFT HAND  Final   Special Requests   Final    BOTTLES DRAWN AEROBIC AND ANAEROBIC Blood Culture results may not be optimal due to an inadequate volume of blood received in culture bottles   Culture   Final    NO GROWTH 5 DAYS Performed at [facility], [place] [address], [place], [place] [address]    Report Status 10/07/2018 FINAL  Final  MRSA PCR Screening     Status: None   Collection Time:  10/02/18 11:49 PM  Result Value Ref Range Status   MRSA by PCR NEGATIVE NEGATIVE Final    Comment:        The GeneXpert MRSA Assay (FDA approved for NASAL specimens only), is one component of a comprehensive MRSA colonization surveillance program. It is not intended to diagnose MRSA infection nor to guide or monitor treatment for MRSA infections. Performed at [facility], [place] [address] [place], [place] [address]   Urine Culture     Status: None   Collection Time: 10/03/18 12:11 PM  Result Value Ref Range Status   Specimen Description URINE, RANDOM  Final   Special Requests NONE  Final   Culture   Final    NO GROWTH Performed at [facility], [place] [address], [place], [place] [address]    Report Status 10/05/2018 FINAL  Final  Culture, respiratory (non-expectorated)     Status: None   Collection Time: 10/04/18  3:25 AM  Result Value Ref Range Status   Specimen Description TRACHEAL ASPIRATE  Final   Special Requests NONE  Final   Gram Stain   Final    ABUNDANT WBC PRESENT, PREDOMINANTLY PMN FEW GRAM NEGATIVE RODS Performed at [facility], [place] [address] [place], [place] [address]    Culture MODERATE STENOTROPHOMONAS MALTOPHILIA  Final   Report Status 10/06/2018 FINAL  Final   Organism ID, Bacteria STENOTROPHOMONAS MALTOPHILIA  Final      Susceptibility   Stenotrophomonas maltophilia - MIC*    LEVOFLOXACIN 0.5 SENSITIVE Sensitive     TRIMETH/SULFA 80 RESISTANT Resistant     * MODERATE STENOTROPHOMONAS MALTOPHILIA  Culture, blood (Routine X 2) w Reflex to ID Panel     Status: None   Collection Time: 10/04/18  3:48 AM  Result Value Ref Range Status   Specimen Description BLOOD LEFT ANTECUBITAL  Final   Special Requests   Final    BOTTLES DRAWN AEROBIC ONLY Blood Culture results may not be optimal due to an inadequate volume of blood received in culture bottles   Culture   Final    NO GROWTH 5 DAYS Performed at [facility], [place] [address], [place], [place] [address]    Report Status 10/09/2018 FINAL  Final  Culture, blood (Routine X 2) w Reflex to ID Panel     Status: None   Collection Time: 10/04/18  3:48 AM  Result Value Ref Range Status   Specimen Description BLOOD LEFT HAND  Final   Special Requests   Final    BOTTLES DRAWN AEROBIC ONLY Blood Culture results may not be optimal due to an inadequate volume of blood received in culture bottles   Culture   Final    NO GROWTH 5 DAYS Performed at [facility], [place] [address], [place],  [address]    Report Status 10/09/2018 FINAL  Final  Culture, blood (routine x 2)     Status: None (Preliminary result)   Collection Time: 10/07/18 11:50  AM  Result Value Ref Range Status   Specimen Description BLOOD LEFT HAND  Final   Special Requests   Final    BOTTLES DRAWN AEROBIC ONLY Blood Culture adequate volume   Culture   Final    NO GROWTH 2 DAYS Performed at [facility], [address], [place], [place] [address]    Report Status PENDING  Incomplete  Culture, blood (routine x 2)     Status: None (Preliminary result)   Collection Time: 10/07/18 11:55 AM  Result Value Ref Range Status   Specimen Description BLOOD LEFT HAND  Final   Special Requests   Final    BOTTLES DRAWN AEROBIC ONLY Blood Culture results may not be optimal due to an inadequate volume of blood received in culture bottles   Culture   Final    NO GROWTH 2 DAYS Performed at [facility], [place] [address] [place], [place] [address]    Report Status PENDING  Incomplete      Radiology Studies: Ct Abdomen Pelvis W Contrast  Result Date: 10/08/2018 CLINICAL DATA:  Epigastric tenderness EXAM: CT ABDOMEN AND PELVIS WITH CONTRAST TECHNIQUE: Multidetector CT imaging of the abdomen and pelvis was performed using the standard protocol following bolus administration of intravenous contrast. CONTRAST:  167m ISOVUE-300 IOPAMIDOL (ISOVUE-300) INJECTION 61% COMPARISON:  10/02/2018 FINDINGS: Lower  chest: Small bilateral pleural effusions and associated atelectasis or consolidation, new compared to prior examination. Coronary artery calcifications. Hepatobiliary: No focal liver abnormality is seen. Status post cholecystectomy. Dilatation of the common bile duct to approximately 11 mm. Pancreas: Unremarkable. No pancreatic ductal dilatation or surrounding inflammatory changes. Spleen: Normal in size without focal abnormality. Adrenals/Urinary Tract: Adrenal glands are unremarkable. Mild bilateral hydronephrosis. Distended urinary bladder. Stomach/Bowel: Esophagogastric tube with tip and side port below the diaphragm. Extensive pleural material about the greater curvature of stomach. Appendix appears normal. No evidence of bowel wall thickening, distention, or inflammatory changes. Rectal tube. Vascular/Lymphatic: Mixed calcific atherosclerosis. No enlarged abdominal or pelvic lymph nodes. Reproductive: No mass or other abnormality. Other: Small, fat containing epigastric hernia. No abdominopelvic ascites. Musculoskeletal: No acute or significant osseous findings. IMPRESSION: 1. No definite acute CT finding of the abdomen or pelvis to explain abdominal pain. 2. Distended urinary bladder with mild bilateral hydronephrosis. Correlate for urinary obstruction. 3.  Other chronic and incidental findings as detailed above. Electronically Signed   By: AEddie CandleM.D.   On: 10/08/2018 13:27     LOS: 8 days   Time spent: More than 50% of that time was spent in counseling and/or coordination of care.  RAntonieta Pert MD [facility]  10/10/2018, 11:46 AM

## 2018-10-10 NOTE — Progress Notes (Signed)
  Speech Language Pathology Treatment: Dysphagia  Patient Details Name: Jordan Reed MRN: [id number] DOB: October 08, 1950 Today's Date: 10/10/2018 Time: 8756-4332 SLP Time Calculation (min) (ACUTE ONLY): 16 min  Assessment / Plan / Recommendation Clinical Impression  Pt still quite altered today, but easily alert with repositioning. Pt noted to have wet vocal quality indicating standing pharyngeal secretions. When she did swallow her secretions, she grimaced. Verbalizations were mostly unintelligible, but pt was responsive to pleasant intonation and encouragement from therapist and responded to verbal, visual and tactile cues given to encourage intake. Pt able to initiate oral transit and swallow response with ice, sip of water and 4 bites of puree. Every swallow elicited a grimace and eventual cough response.   Pt is very likely having difficulty swallowing against the firm NG tube in her pharynx, which may be causing poor secretion management and even aspiration of secretions. Would advise removal of NG Friday either to replace with Cortrak or to initiate comfort feeding, depending on plan of care. Discussed with RN, Will f/u.     HPI HPI: Patient with unknown past medical health. Found on porch slumped over. In ED she was intubated for airway protection, dehydration and rhabdomyolysis. She was extubated after 3 days on 10/04/18. She has no known history of swallowing difficulties.       SLP Plan  Continue with current plan of care       Recommendations                   Oral Care Recommendations: Oral care QID Follow up Recommendations: Skilled Nursing facility SLP Visit Diagnosis: Dysphagia, unspecified (R13.10) Plan: Continue with current plan of care       GO               [name], MA CCC-SLP  Acute Rehabilitation Services Pager [phone] Office [phone]  [name] 10/10/2018, 2:32 PM

## 2018-10-10 NOTE — Progress Notes (Signed)
Daily Progress Note   Patient Name: Jordan Reed       Date: 10/10/2018 DOB: [birth year]  Age: 68 y.o. MRN#: [id number] Attending Physician: Antonieta Pert, MD Primary Care Physician: Patient, No Pcp Per Admit Date: 10/02/2018  Reason for Consultation/Follow-up: Establishing goals of care  Subjective: Received update from RN and chart review - no changes, still minimally interactive, not eating/drinking, pulled out IV overnight - HR better controlled, creatinine worsening  Length of Stay: 8  Current Medications: Scheduled Meds:   apixaban  5 mg Oral BID   chlorhexidine gluconate (MEDLINE KIT)  15 mL Mouth Rinse BID   diltiazem  30 mg Per Tube Q6H   feeding supplement (OSMOLITE 1.2 CAL)  237 mL Per Tube 5 X Daily   feeding supplement (PRO-STAT SUGAR FREE 64)  30 mL Per Tube TID   free water  200 mL Per Tube Q6H   Gerhardt's butt cream   Topical BID   insulin aspart  0-15 Units Subcutaneous Q4H   mouth rinse  15 mL Mouth Rinse BID   metoprolol tartrate  50 mg Per Tube BID   multivitamin  15 mL Oral Daily   sodium chloride flush  3 mL Intravenous Once    Continuous Infusions:  sodium chloride 100 mL/hr at 10/10/18 0520   cefTAZidime (FORTAZ)  IV 2 g (10/10/18 0118)   metronidazole 500 mg (10/10/18 0959)    PRN Meds: acetaminophen (TYLENOL) oral liquid 160 mg/5 mL, diltiazem  Vital Signs: BP 109/61 (BP Location: Right Arm)    Pulse (!) 118    Temp 100.1 F (37.8 C) (Oral)    Resp (!) 24    Ht '5\' 5"'$  (1.651 m)    Wt 79.8 kg    SpO2 100%    BMI 29.28 kg/m  SpO2: SpO2: 100 % O2 Device: O2 Device: Nasal Cannula O2 Flow Rate: O2 Flow Rate (L/min): 2 L/min  Intake/output summary:   Intake/Output Summary (Last 24 hours) at 10/10/2018 1101 Last data filed at 10/10/2018 0416 Gross per  24 hour  Intake 1478.79 ml  Output 2000 ml  Net -521.21 ml   LBM: Last BM Date: 10/09/18 Baseline Weight: Weight: 77.1 kg Most recent weight: Weight: 79.8 kg       Palliative Assessment/Data: PPS 20%    Flowsheet Rows     Most Recent Value  Intake Tab  Referral Department  Hospitalist  Unit at Time of Referral  Med/Surg Unit  Palliative Care Primary Diagnosis  Cardiac  Date Notified  10/09/18  Palliative Care Type  New Palliative care  Reason for referral  Clarify Goals of Care  Date of Admission  10/02/18  Date first seen by Palliative Care  10/09/18  # of days Palliative referral response time  0 Day(s)  # of days IP prior to Palliative referral  7  Clinical Assessment  Psychosocial & Spiritual Assessment  Palliative Care Outcomes      Patient Active Problem List   Diagnosis Date Noted   Early onset Alzheimer's dementia without behavioral disturbance ([place])    Palliative care encounter    Pressure injury of skin 10/04/2018   AKI (acute kidney injury) ([place])    Acute encephalopathy  Acute respiratory failure ([facility])    Altered mental status    Rhabdomyolysis 10/02/2018    Palliative Care Assessment & Plan   HPI: 68 y.o. female  with past medical history of dementia/Alzheimer's, diabetes, HTN, hyperthyroid, anemia, former long term smoker admitted on 10/02/2018 after found slumped on porch and EMS (significant other is caregiver and had been hospitalized x 4 days at this time reportedly) called she was found to be in SVT and cardioverted. Required intubation (extubated 10/04/18) and with AKI with rhabdomyolysis and severe dehydration. Has had atrial fibrillation with RVR and continues with fever and concern for aspiration. Continues to fail swallow study and high risk aspiration.     Assessment: I have reviewed medical records including EPIC notes, labs and imaging, received report from RN, then spoke with patient's daughter, [name],  to discuss diagnosis  prognosis, [place], EOL wishes, disposition and options.  I introduced Palliative Medicine as specialized medical care for people living with serious illness. It focuses on providing relief from the symptoms and stress of a serious illness. The goal is to improve quality of life for both the patient and the family.  [name] shares that patient has been living with boyfriend/caregiver. She tells me patient will not be going back here. She tells me there was somewhat of a strained relationship with her and her mom but she checked on her often. She had not seen the patient is 2 weeks prior to admission due to COVID-19 guidelines. [name] is a [employer] scientist. The patient has another daughter, [name]. [name] lives in [place] but visited recently when patient was intubated. [name] plans to update [name] on conversation and include her in decision making process.  As far as functional and nutritional status, she tells me patient was still very talkative and interactive. Walked without difficulty. She was unable to care for herself due to her cognitive decline - needed assistance bathing. She could feed herself but never initiated eating - always had to be handed food and told to eat. [name] does share of a recent weight loss of about 15 pounds.    We discussed patient's current illness and what it means in the larger context of her on-going co-morbidities. We discussed concern over patient continued altered mental status and aspiration pneumonia. Discussed her failed swallow studies. Discussed worsening kidney function.   I attempted to elicit values and goals of care important to the patient.  Goals have already been set for DNR. I brought up a feeding tube and we discussed risks/benefits - discussed that patient could receive nutrition, but may not tolerate and does not eliminate risk of aspiration. Discussed quality of life concerns.   The difference between aggressive medical intervention and comfort care was considered in light of the  patient's goals of care. We discussed continuing current care but also discussed how to proceed if patient declines or does not improve. Discussed what a comfort path entails.  Hospice and Palliative Care services outpatient were explained and offered. We discussed if patient improves some - able to tolerate PO intake - daughter would want her to go to SNF - if she continues to refuse PO intake we discussed residential hospice.  Questions and concerns were addressed. The family was encouraged to call with questions or concerns.   Recommendations/Plan:  Continue DNR  Continue current care  If declines or does not improve in coming days - discussed comfort care, hospice home  Discussed feeding tube - daughter leaning away from - plans to speak to sister  about itd  Code Status:  DNR  Prognosis:   Unable to determine  Discharge Planning:  To Be Determined - SNF vs residential hospice  Care plan was discussed with RN, daughter [name], and Dr. Lupita Leash  Thank you for allowing the Palliative Medicine Team to assist in the care of this patient.   Total Time 45 minutes Prolonged Time Billed  no    The above conversation was completed via telephone due to the visitor restrictions during the COVID-19 pandemic. Thorough chart review and discussion with necessary members of the care team was completed as part of assessment. All issues were discussed and addressed but no physical exam was performed.     Greater than 50%  of this time was spent counseling and coordinating care related to the above assessment and plan.  Juel Burrow, DNP, [facility] Palliative Medicine Team Team Phone # [phone]  Pager [phone]

## 2018-10-11 DIAGNOSIS — Z515 Encounter for palliative care: Secondary | ICD-10-CM

## 2018-10-11 LAB — GLUCOSE, CAPILLARY
Glucose-Capillary: 163 mg/dL — ABNORMAL HIGH (ref 70–99)
Glucose-Capillary: 156 mg/dL — ABNORMAL HIGH (ref 70–99)
Glucose-Capillary: 247 mg/dL — ABNORMAL HIGH (ref 70–99)
Glucose-Capillary: 184 mg/dL — ABNORMAL HIGH (ref 70–99)

## 2018-10-11 LAB — BASIC METABOLIC PANEL
Anion gap: 9 (ref 5–15)
BUN: 48 mg/dL — ABNORMAL HIGH (ref 8–23)
CO2: 21 mmol/L — ABNORMAL LOW (ref 22–32)
Calcium: 9.8 mg/dL (ref 8.9–10.3)
Chloride: 113 mmol/L — ABNORMAL HIGH (ref 98–111)
Creatinine, Ser: 1.1 mg/dL — ABNORMAL HIGH (ref 0.44–1.00)
GFR calc Af Amer: >60 mL/min (ref >60)
GFR calc non Af Amer: 52 mL/min — ABNORMAL LOW (ref >60)
Glucose, Bld: 186 mg/dL — ABNORMAL HIGH (ref 70–99)
Potassium: 3.5 mmol/L (ref 3.5–5.1)
Sodium: 143 mmol/L (ref 135–145)

## 2018-10-11 LAB — CBC
HCT: 34.9 % — ABNORMAL LOW (ref 36.0–46.0)
Hemoglobin: 11.6 g/dL — ABNORMAL LOW (ref 12.0–15.0)
MCH: 29.2 pg (ref 26.0–34.0)
MCHC: 33.2 g/dL (ref 30.0–36.0)
MCV: 87.9 fL (ref 80.0–100.0)
Platelets: 225 10*3/uL (ref 150–400)
RBC: 3.97 MIL/uL (ref 3.87–5.11)
RDW: 15.3 % (ref 11.5–15.5)
WBC: 9.2 10*3/uL (ref 4.0–10.5)
nRBC: 0 % (ref 0.0–0.2)

## 2018-10-11 MED ORDER — METOPROLOL TARTRATE 5 MG/5ML IV SOLN: 5.0000 mg | Freq: Four times a day (QID) | INTRAVENOUS | Status: DC | PRN

## 2018-10-11 MED ORDER — DILTIAZEM HCL 60 MG PO TABS
30.0000 mg | ORAL_TABLET | ORAL | Status: AC
  Administered 2018-10-11: 30 mg via ORAL
  Filled 2018-10-11: qty 1

## 2018-10-11 MED ORDER — DILTIAZEM 12 MG/ML ORAL SUSPENSION
60.0000 mg | Freq: Four times a day (QID) | ORAL | Status: DC
  Administered 2018-10-11 – 2018-10-16 (×20): 60 mg
  Filled 2018-10-11 (×20): qty 6

## 2018-10-11 MED ORDER — SODIUM CHLORIDE 0.9 % IV SOLN
2.0000 g | Freq: Three times a day (TID) | INTRAVENOUS | Status: DC
  Administered 2018-10-11 – 2018-10-16 (×16): 2 g via INTRAVENOUS
  Filled 2018-10-11 (×19): qty 2

## 2018-10-11 MED ORDER — JEVITY 1.2 CAL PO LIQD
237.0000 mL | Freq: Every day | ORAL | Status: DC
  Administered 2018-10-11 – 2018-10-16 (×21): 237 mL
  Filled 2018-10-11 (×12): qty 237
  Filled 2018-10-11: qty 474
  Filled 2018-10-11 (×17): qty 237

## 2018-10-11 NOTE — Progress Notes (Signed)
SLP Cancellation Note  Patient Details Name: Jordan Reed MRN: [id number] DOB: Feb 15, 1951   Cancelled treatment:       Reason Eval/Treat Not Completed: Other (comment). Checked in, pt still has firm NG and mentation still altered. Pt will need feeding tube change to Cortrak to progress with swallow function. Discussed with RN and Palliative care yesterday.   [name], MA CCC-SLP  Acute Rehabilitation Services Pager [phone] Office [phone]  [name] 10/11/2018, 11:05 AM

## 2018-10-11 NOTE — Progress Notes (Signed)
PROGRESS NOTE    BRENTLEE SCIARA  ZOX:[id number] DOB: Jul 08, 1950 DOA: 10/02/2018 PCP: Patient, No Pcp Per   Brief Narrative: Per HPI: 68 year old female with advanced dementia, former long-term smoker, diabetes, hypothyroidismshe has no medical records with [place] or in care everywhere.she lives with her boyfriend who is also a caregiver who was unfortunately hospitalized 4 days prior to her admission, she was found slumped down in her front porch by neighbors, subsequently EMS was alerted she was found to be somnolent, noted to be in SVT, cardioverted  -In ED she remained tachycardic, narrow complex subsequently became agitated in the emergency room required sedation and subsequently intubation for airway protection, also had low grade temp of 100. -Laboratory evaluation consistent with dehydration rhabdomyolysis and therefore IV fluid resuscitation was given. Patient admitted to ICU for hypoxemic respiratory failure as well as metabolic encephalopathy related to AKI with rhabdomyolysis. She is also noted to have atrial fibrillation with RVR for which she was started on heparin drip as well as amiodarone.  Patient has since converted to sinus tachycardia and amiodarone has been discontinued.    On admission to ICU, noted to have fever overnight on 4/16 for which she was initially given vancomycin as well as Zosyn.  Vancomycin has been discontinued and patient remains on Zosyn with sputum cultures demonstrating gram-negative rods.  She continues to have persistent encephalopathy with some facial droop, MRI negative for CVA.  -4/17: extubated on 4/17 -4/18: transferred from PCCM to [facility] service -4/19 developed diffuse erythematous rash, likley drug rash, Zosyn was discontinued, she was started on levofloxacin instead -4/20: worsening fever to 101, CXR with bibasilar opacities concerning for Asp PNA -4/21: abx adjusted to ceftazidime and Flagyl. Ct ABD lower chest with small bilateral pleural effusion and  associated atelectasis or consolidation, no acute abdominal findings Patient remains confused, altered unable to take orally has been failing speech eval, has NG tube in place. Palliative care is following closely.  Subjective:  Seen and examined this morning.  Heart rate has been 100-1 30s A. fib, patient appears confused, overall not much improved.  No temperature spike except for low-grade fever.  Assessment & Plan:  Acute respiratory failure with hypoxia.  Initially intubated for airway protection, extubated 4/17 and with aspiration pneumonia: Currently on 2 L nasal cannula. Patient has been tachycardic tachypneic.  Plan is to continue nasal cannula, pulmonary toileting, aspiration precaution.  .  C/W [employer], pulmonary toileting supportive care, aspiration precaution.  CT abdomen 4/21 shows lower chest with small bilateral pleural effusion with associated consolidation/atelectasis.   Aspiration pneumonia/fever: Was admitted to ICU with low-grade fever initially along with agitation SVT.Started on IV Zosyn in the ICU, sputum cultures grew stenotrophomonas maltophilia of questionable significance, had severe drug rash and Zosyn was discontinued 4/21 and started on ceftazidime/Flagyl since 4/21. Repeat x-ray 4/20 shows bibasilar opacities concerning for aspiration. CT 4/21 w lower chest showing Small bilateral pleural effusion with associated atelectasis/consolidation.Cont on ceftazidime/Flagyl.  She hasn't had any spike in  fever ,Remains n.p.o. for now at risk for aspiration, speech following and NG tube in place  Drug rash: Still has erythematous rash involving two thirds of her body mostly in the abdomen arms thighs distal legs.monitor. No break/ulcer.  Rash appears more discrete, stable may be slightly better/less erythematous.   AKI: Creat downtrending with hydration, will cont on IV fluids continue to feed and c/w free water, supportive care.  Avoid nephrotoxic medication.Had purewick and on  bladder scan: pt was retaining urine >600 ml-  foley placed 4/22. Monitor UOP and has been negative, so will cont w ivf.  Diarrhea- having loose stool, c/w IVF, supportive care. No more fever, lecucotysis improved. On tube feed. . Recent Labs  Lab 10/07/18 0230 10/08/18 0734 10/09/18 0216 10/10/18 0234 10/11/18 0324  CREATININE 0.74 0.83 1.39* 1.43* 1.10*   PAF: back in a fib w RVR 100-130s. TTE with preserved EF, hyperdynamic LV.  Increasing Cardizem p.o., continue Lopressor p.o., add IV as needed Lopressor.  Watch for low blood pressure.Cont Eliquis.Noted to have suppressed TSH on admission, holding Synthroid and checking TSH in 2 weeks.  Dementia with acute encephalopathy likely metabolic: Remains confused does not follow commands. Per daughter has moderate to severe dementia at baseline on Aricept and Namenda at home and confused part of the day, able to recognize  immediate family members only.Continue supportive care.  Hypothyroidism history on Synthroid but TSH was less than 0.01.  Holding Synthroid check TSH in 2 weeks.  T2 Diabetes mellitus with hyperglycemia : Controlled.  Hemoglobin C 5.7.  Stable.  Continue sliding scale insulin.  Long-term smoker/suspected COPD.  Continue supportive care.  DVT prophylaxis: eliquis Code Status: DNR.  Palliative care on board for goals of care Family Communication: Discussed with the palliative care, who has spoken with patient's daughter who understands that patient may not improve, if she does improve plan is for SNF if not may need to look into hospice, daughter has verbalized understanding as per palliative care discussion.  4/24- called daughter and updated her, she understands that patient is not getting well quickly, and is at risk for decompensation.   Disposition Plan: remains inpatient pending clinical improvement. Needs SNF if improves medically.  Consultants:  PCCM Palliative care medicine  Procedures:  CT ABD/Pelvis 10/08/18  Lower chest: Small bilateral pleural effusions and associated atelectasis or consolidation, new compared to prior examination, No definite acute CT finding of the abdomen or pelvis to explain abdominal pain. 2. Distended urinary bladder with mild bilateral hydronephrosis. Correlate for urinary obstruction. 3.  Other chronic and incidental findings as detailed above.  Antimicrobials:  Vancomycin 4/17-4/17  Zosyn 4/17->4/21  Levaquin: 4/19>4/21  Ceftazidime/Flagyl: 4/21.>  Anti-infectives (From admission, onward)   Start     Dose/Rate Route Frequency Ordered Stop   10/11/18 1000  cefTAZidime (FORTAZ) 2 g in sodium chloride 0.9 % 100 mL IVPB     2 g 200 mL/hr over 30 Minutes Intravenous Every 8 hours 10/11/18 0916     10/10/18 0200  cefTAZidime (FORTAZ) 2 g in sodium chloride 0.9 % 100 mL IVPB  Status:  Discontinued     2 g 200 mL/hr over 30 Minutes Intravenous Every 12 hours 10/09/18 2122 10/11/18 0916   10/08/18 1200  cefTAZidime (FORTAZ) 2 g in sodium chloride 0.9 % 100 mL IVPB  Status:  Discontinued     2 g 200 mL/hr over 30 Minutes Intravenous Every 8 hours 10/08/18 1045 10/09/18 2122   10/08/18 1000  metroNIDAZOLE (FLAGYL) IVPB 500 mg     500 mg 100 mL/hr over 60 Minutes Intravenous Every 8 hours 10/08/18 0914     10/06/18 2200  levofloxacin (LEVAQUIN) IVPB 750 mg  Status:  Discontinued     750 mg 100 mL/hr over 90 Minutes Intravenous Every 24 hours 10/06/18 2111 10/08/18 0906   10/06/18 0400  vancomycin (VANCOCIN) 1,250 mg in sodium chloride 0.9 % 250 mL IVPB  Status:  Discontinued     1,250 mg 166.7 mL/hr over 90 Minutes Intravenous Every 48 hours  10/04/18 0336 10/04/18 1036   10/04/18 1200  piperacillin-tazobactam (ZOSYN) IVPB 3.375 g  Status:  Discontinued     3.375 g 12.5 mL/hr over 240 Minutes Intravenous Every 8 hours 10/04/18 0332 10/06/18 2111   10/04/18 0345  piperacillin-tazobactam (ZOSYN) IVPB 3.375 g     3.375 g 100 mL/hr over 30 Minutes Intravenous  Once  10/04/18 0332 10/04/18 0421   10/04/18 0345  vancomycin (VANCOCIN) 1,250 mg in sodium chloride 0.9 % 250 mL IVPB     1,250 mg 166.7 mL/hr over 90 Minutes Intravenous  Once 10/04/18 0332 10/04/18 0607   10/02/18 1100  cefTRIAXone (ROCEPHIN) 2 g in sodium chloride 0.9 % 100 mL IVPB     2 g 200 mL/hr over 30 Minutes Intravenous  Once 10/02/18 1051 10/02/18 1144       Objective: Vitals:   10/10/18 2355 10/11/18 0430 10/11/18 0434 10/11/18 0735  BP: 116/63 (!) 120/52    Pulse: (!) 111 (!) 112    Resp: (!) 25 (!) 24    Temp: 99.7 F (37.6 C) 99.6 F (37.6 C)  99.6 F (37.6 C)  TempSrc: Oral Axillary  Axillary  SpO2: 99% 98%    Weight:   80.9 kg   Height:        Intake/Output Summary (Last 24 hours) at 10/11/2018 1132 Last data filed at 10/11/2018 0645 Gross per 24 hour  Intake 260 ml  Output 1625 ml  Net -1365 ml   Filed Weights   10/09/18 0311 10/10/18 0407 10/11/18 0434  Weight: 80.1 kg 79.8 kg 80.9 kg   Weight change: 1.1 kg  Body mass index is 29.68 kg/m.  Intake/Output from previous day: 04/23 0701 - 04/24 0700 In: 557 [NG/GT:297; IV Piggyback:200] Out: 1625 [Urine:825; Stool:800] Intake/Output this shift: No intake/output data recorded.  Examination:  General exam: Elderly, appears weak and frail, confused does not follow commands. HEENT:PERRL,Oral mucosa moist, Ear/Nose normal on gross exam Respiratory system: Bilateral equal air entry present with crackly sound coming mostly from upper airways- RN for getting ready for suctioning. Cardiovascular system: S1 & S2 heard,No JVD, murmurs. Gastrointestinal system: Abdomen is  soft,non distended, BS +  Nervous System: Weak, lethargic, does not follow commands able to move extremities Extremities: Upper extremity edema present mild edema in the lower extremity, Skin: Erythematous rash present abdomen uppercase upper arm and lower extremities-appears more discrete today may be slightly better  MSK: Normal muscle  bulk,tone ,power  Medications:  Scheduled Meds: . apixaban  5 mg Oral BID  . chlorhexidine gluconate (MEDLINE KIT)  15 mL Mouth Rinse BID  . diltiazem  60 mg Per Tube Q6H  . feeding supplement (OSMOLITE 1.2 CAL)  237 mL Per Tube 5 X Daily  . feeding supplement (PRO-STAT SUGAR FREE 64)  30 mL Per Tube TID  . free water  200 mL Per Tube Q6H  . Gerhardt's butt cream   Topical BID  . insulin aspart  0-9 Units Subcutaneous TID WC  . insulin aspart  3 Units Subcutaneous 5 X Daily  . mouth rinse  15 mL Mouth Rinse BID  . metoprolol tartrate  50 mg Per Tube BID  . multivitamin  15 mL Oral Daily  . sodium chloride flush  3 mL Intravenous Once   Continuous Infusions: . sodium chloride 75 mL/hr at 10/10/18 1435  . cefTAZidime (FORTAZ)  IV 2 g (10/11/18 1129)  . metronidazole 500 mg (10/11/18 8546)    Data Reviewed: I have personally reviewed following labs and  imaging studies  CBC: Recent Labs  Lab 10/07/18 1154 10/08/18 0249 10/09/18 0216 10/10/18 0234 10/11/18 0324  WBC 11.7* 12.3* 13.9* 11.4* 9.2  NEUTROABS 9.6*  --   --   --   --   HGB 13.1 13.4 12.7 11.3* 11.6*  HCT 39.4 40.8 39.2 34.6* 34.9*  MCV 86.6 85.5 87.9 88.3 87.9  PLT 188 223 268 214 416   Basic Metabolic Panel: Recent Labs  Lab 10/04/18 1933  10/07/18 0230 10/08/18 0734 10/09/18 0216 10/10/18 0234 10/11/18 0324  NA  --    < > 141 144 141 142 143  K  --    < > 3.3* 3.9 4.2 3.9 3.5  CL  --    < > 112* 110 114* 115* 113*  CO2  --    < > 23 22 19* 20* 21*  GLUCOSE  --    < > 169* 149* 250* 215* 186*  BUN  --    < > 17 18 44* 53* 48*  CREATININE  --    < > 0.74 0.83 1.39* 1.43* 1.10*  CALCIUM  --    < > 9.1 9.4 9.7 9.3 9.8  MG 2.1  --  1.6*  --  2.1  --   --   PHOS 2.3*  --   --   --   --   --   --    < > = values in this interval not displayed.   GFR: Estimated Creatinine Clearance: 52.2 mL/min (A) (by C-G formula based on SCr of 1.1 mg/dL (H)). Liver Function Tests: Recent Labs  Lab 10/06/18 0252  10/08/18 0734 10/09/18 0216  AST 31 35 25  ALT 39 34 33  ALKPHOS 74 60 70  BILITOT 0.9 0.5 0.4  PROT 5.3* 4.6* 4.6*  ALBUMIN 2.2* 1.8* 1.7*   No results for input(s): LIPASE, AMYLASE in the last 168 hours. No results for input(s): AMMONIA in the last 168 hours. Coagulation Profile: No results for input(s): INR, PROTIME in the last 168 hours. Cardiac Enzymes: Recent Labs  Lab 10/05/18 0319 10/06/18 0252  CKTOTAL 353* 114   BNP (last 3 results) No results for input(s): PROBNP in the last 8760 hours. HbA1C: No results for input(s): HGBA1C in the last 72 hours. CBG: Recent Labs  Lab 10/10/18 1642 10/10/18 2155 10/11/18 0234 10/11/18 0639 10/11/18 1112  GLUCAP 202* 148* 163* 156* 247*   Lipid Profile: No results for input(s): CHOL, HDL, LDLCALC, TRIG, CHOLHDL, LDLDIRECT in the last 72 hours. Thyroid Function Tests: No results for input(s): TSH, T4TOTAL, FREET4, T3FREE, THYROIDAB in the last 72 hours. Anemia Panel: No results for input(s): VITAMINB12, FOLATE, FERRITIN, TIBC, IRON, RETICCTPCT in the last 72 hours. Sepsis Labs: Recent Labs  Lab 10/08/18 1040  PROCALCITON <0.10    Recent Results (from the past 240 hour(s))  Culture, blood (Routine x 2)     Status: Abnormal   Collection Time: 10/02/18 11:00 AM  Result Value Ref Range Status   Specimen Description BLOOD LEFT ARM  Final   Special Requests   Final    BOTTLES DRAWN AEROBIC AND ANAEROBIC Blood Culture results may not be optimal due to an inadequate volume of blood received in culture bottles   Culture  Setup Time   Final    GRAM POSITIVE RODS CRITICAL RESULT CALLED TO, READ BACK BY AND VERIFIED WITH: RHRMD V BRTK _0  10/05/18 BY S GEZAHEGN    Culture (A)  Final    CORYNEBACTERIUM, GROUP JK  Standardized susceptibility testing for this organism is not available. Performed at [facility], [place] [address] [place], [place] [address]    Report Status 10/07/2018 FINAL  Final  Culture, blood (Routine  x 2)     Status: None   Collection Time: 10/02/18 11:03 AM  Result Value Ref Range Status   Specimen Description BLOOD LEFT HAND  Final   Special Requests   Final    BOTTLES DRAWN AEROBIC AND ANAEROBIC Blood Culture results may not be optimal due to an inadequate volume of blood received in culture bottles   Culture   Final    NO GROWTH 5 DAYS Performed at [facility], [place] [address], [place], [place] [address]    Report Status 10/07/2018 FINAL  Final  MRSA PCR Screening     Status: None   Collection Time: 10/02/18 11:49 PM  Result Value Ref Range Status   MRSA by PCR NEGATIVE NEGATIVE Final    Comment:        The GeneXpert MRSA Assay (FDA approved for NASAL specimens only), is one component of a comprehensive MRSA colonization surveillance program. It is not intended to diagnose MRSA infection nor to guide or monitor treatment for MRSA infections. Performed at [facility], [place] [address], [place], [place] [address]   Urine Culture     Status: None   Collection Time: 10/03/18 12:11 PM  Result Value Ref Range Status   Specimen Description URINE, RANDOM  Final   Special Requests NONE  Final   Culture   Final    NO GROWTH Performed at [facility], [place] [address], [place], [place] [address]    Report Status 10/05/2018 FINAL  Final  Culture, respiratory (non-expectorated)     Status: None   Collection Time: 10/04/18  3:25 AM  Result Value Ref Range Status   Specimen Description TRACHEAL ASPIRATE  Final   Special Requests NONE  Final   Gram Stain   Final    ABUNDANT WBC PRESENT, PREDOMINANTLY PMN FEW GRAM NEGATIVE RODS Performed at [facility], [place] [address], [place], [place] [address]    Culture MODERATE STENOTROPHOMONAS MALTOPHILIA  Final   Report Status 10/06/2018 FINAL  Final   Organism ID, Bacteria STENOTROPHOMONAS MALTOPHILIA  Final      Susceptibility   Stenotrophomonas maltophilia - MIC*    LEVOFLOXACIN 0.5 SENSITIVE Sensitive      TRIMETH/SULFA 80 RESISTANT Resistant     * MODERATE STENOTROPHOMONAS MALTOPHILIA  Culture, blood (Routine X 2) w Reflex to ID Panel     Status: None   Collection Time: 10/04/18  3:48 AM  Result Value Ref Range Status   Specimen Description BLOOD LEFT ANTECUBITAL  Final   Special Requests   Final    BOTTLES DRAWN AEROBIC ONLY Blood Culture results may not be optimal due to an inadequate volume of blood received in culture bottles   Culture   Final    NO GROWTH 5 DAYS Performed at [facility], [place] [address] [place], [place] [address]    Report Status 10/09/2018 FINAL  Final  Culture, blood (Routine X 2) w Reflex to ID Panel     Status: None   Collection Time: 10/04/18  3:48 AM  Result Value Ref Range Status   Specimen Description BLOOD LEFT HAND  Final   Special Requests   Final    BOTTLES DRAWN AEROBIC ONLY Blood Culture results may not be optimal due to  an inadequate volume of blood received in culture bottles   Culture   Final    NO GROWTH 5 DAYS Performed at [facility], [place] [address], [place], [place] [address]    Report Status 10/09/2018 FINAL  Final  Culture, blood (routine x 2)     Status: None (Preliminary result)   Collection Time: 10/07/18 11:50 AM  Result Value Ref Range Status   Specimen Description BLOOD LEFT HAND  Final   Special Requests   Final    BOTTLES DRAWN AEROBIC ONLY Blood Culture adequate volume   Culture   Final    NO GROWTH 4 DAYS Performed at [facility], [place] [address], [place], [place] [address]    Report Status PENDING  Incomplete  Culture, blood (routine x 2)     Status: None (Preliminary result)   Collection Time: 10/07/18 11:55 AM  Result Value Ref Range Status   Specimen Description BLOOD LEFT HAND  Final   Special Requests   Final    BOTTLES DRAWN AEROBIC ONLY Blood Culture results may not be optimal due to an inadequate volume of blood received in culture bottles   Culture   Final    NO GROWTH 4 DAYS Performed at  [facility], [place] [address] [place], [place] [address]    Report Status PENDING  Incomplete      Radiology Studies: No results found.   LOS: 9 days   Time spent: More than 50% of that time was spent in counseling and/or coordination of care.  Antonieta Pert, MD [facility]  10/11/2018, 11:32 AM

## 2018-10-11 NOTE — Progress Notes (Signed)
Daily Progress Note   Patient Name: Jordan Reed       Date: 10/11/2018 DOB: [birth year]  Age: 68 y.o. MRN#: [id number] Attending Physician: Jordan Pert, MD Primary Care Physician: Patient, No Pcp Per Admit Date: 10/02/2018  Reason for Consultation/Follow-up: Establishing goals of care  Subjective: Received update from RN and chart review - remains about the same, minimally interactive, not eating or drinking  Length of Stay: 9  Current Medications: Scheduled Meds:  . apixaban  5 mg Oral BID  . chlorhexidine gluconate (MEDLINE KIT)  15 mL Mouth Rinse BID  . diltiazem  60 mg Per Tube Q6H  . feeding supplement (OSMOLITE 1.2 CAL)  237 mL Per Tube 5 X Daily  . feeding supplement (PRO-STAT SUGAR FREE 64)  30 mL Per Tube TID  . free water  200 mL Per Tube Q6H  . Gerhardt's butt cream   Topical BID  . insulin aspart  0-9 Units Subcutaneous TID WC  . insulin aspart  3 Units Subcutaneous 5 X Daily  . mouth rinse  15 mL Mouth Rinse BID  . metoprolol tartrate  50 mg Per Tube BID  . multivitamin  15 mL Oral Daily  . sodium chloride flush  3 mL Intravenous Once    Continuous Infusions: . sodium chloride 75 mL/hr at 10/10/18 1435  . cefTAZidime (FORTAZ)  IV 2 g (10/11/18 1129)  . metronidazole 500 mg (10/11/18 0953)    PRN Meds: acetaminophen (TYLENOL) oral liquid 160 mg/5 mL, metoprolol tartrate  Vital Signs: BP 94/66 (BP Location: Left Arm)   Pulse 74   Temp 99.4 F (37.4 C) (Axillary)   Resp (!) 24   Ht _0  (1.651 m)   Wt 80.9 kg   SpO2 98%   BMI 29.68 kg/m  SpO2: SpO2: 98 % O2 Device: O2 Device: Nasal Cannula O2 Flow Rate: O2 Flow Rate (L/min): 2 L/min  Intake/output summary:   Intake/Output Summary (Last 24 hours) at 10/11/2018 1341 Last data filed at 10/11/2018 0645 Gross per 24  hour  Intake 260 ml  Output 1275 ml  Net -1015 ml   LBM: Last BM Date: 10/10/18 Baseline Weight: Weight: 77.1 kg Most recent weight: Weight: 80.9 kg       Palliative Assessment/Data: PPS 20%    Flowsheet Rows     Most Recent Value  Intake Tab  Referral Department  Hospitalist  Unit at Time of Referral  Med/Surg Unit  Palliative Care Primary Diagnosis  Cardiac  Date Notified  10/09/18  Palliative Care Type  New Palliative care  Reason for referral  Clarify Goals of Care  Date of Admission  10/02/18  Date first seen by Palliative Care  10/09/18  # of days Palliative referral response time  0 Day(s)  # of days IP prior to Palliative referral  7  Clinical Assessment  Psychosocial & Spiritual Assessment  Palliative Care Outcomes      Patient Active Problem List   Diagnosis Date Noted  . Goals of care, counseling/discussion   . Early onset Alzheimer's dementia without behavioral disturbance ([place])   . Palliative care encounter   . Pressure injury of skin 10/04/2018  . AKI (acute kidney injury) ([place])   .  Acute encephalopathy   . Acute respiratory failure ([place])   . Altered mental status   . Rhabdomyolysis 10/02/2018    Palliative Care Assessment & Plan   HPI: 68 y.o. female  with past medical history of dementia/Alzheimer's, diabetes, HTN, hyperthyroid, anemia, former long term smoker admitted on 10/02/2018 after found slumped on porch and EMS (significant other is caregiver and had been hospitalized x 4 days at this time reportedly) called she was found to be in SVT and cardioverted. Required intubation (extubated 10/04/18) and with AKI with rhabdomyolysis and severe dehydration. Has had atrial fibrillation with RVR and continues with fever and concern for aspiration. Continues to fail swallow study and high risk aspiration.     Assessment: Update provided to [facility] today - discussed lack of improvement, remains lethargic, unable to maintain safe PO intake. Discussed SLP had  recommended cortrak to better evaluate swallow - discussed switching to cortrak in light of wish to continue aggressive care or transition to full comfort care. Jordan Reed would like to try cortrak.  She hopes to have video chat with mother - wants to try to encourage her to eat and drink. I have spoken with RN about this.  Jordan Reed also requested a work note for her sister in [place] - this has been addressed.   Recommendations/Plan:  Continue DNR  Continue current care  If declines or does not improve in coming days - discussed comfort care, hospice home  Discussed witching NG to cortrak per SLP rec - daughter would like to try this - discussed with Dr. Lupita Leash and ordered - SLP will see tomorrow  Code Status:  DNR  Prognosis:   Unable to determine  Discharge Planning:  To Be Determined - SNF vs residential hospice  Care plan was discussed with RN, daughter Jordan Reed, and Dr. Lupita Leash  Thank you for allowing the Palliative Medicine Team to assist in the care of this patient.   Total Time 25 minutes Prolonged Time Billed  no    The above conversation was completed via telephone due to the visitor restrictions during the COVID-19 pandemic. Thorough chart review and discussion with necessary members of the care team was completed as part of assessment. All issues were discussed and addressed but no physical exam was performed.     Greater than 50%  of this time was spent counseling and coordinating care related to the above assessment and plan.  Jordan Burrow, DNP, [facility] Palliative Medicine Team Team Phone # [phone]  Pager [phone]

## 2018-10-11 NOTE — Progress Notes (Signed)
Occupational Therapy Treatment Patient Details Name: Jordan Reed MRN: [id number] DOB: 07/19/1950 Today's Date: 10/11/2018    History of present illness 68 year old female with no known past medical history. She was found by a car driving past her house noticed her to be slumped over on the front porch on 10/02/2018.  EMS was alerted and upon their arrival she had altered mental status, profoundly tachycardic with a narrow complex rhythm; She was cardioverted twice without effect and remained tachycardic.  Became increasingly agitated, intubated 4/15 for airway protection.  Laboratory evaluation consistent with dehydration rhabdomyolysis, IV fluid resuscitation was given and extubated on 4/17.    OT comments  Pt NOT making progress towards therapy goals. Pt supine in bed and unaroused by deep sternal rub, calling out her name, music, or PROM to all 4 extremities. The only time she opened her eyes was during a thorough oral care cleaning with suction toothbrush and oral rise followed by suction. Pt total A +2 for rolling in the bed for perianal care as flexiseal had leaked (RN aware). OT will continue to follow acutely. Current POC remains appropriate.    Follow Up Recommendations  SNF;Supervision/Assistance - 24 hour    Equipment Recommendations  Other (comment)(defer to next venue)    Recommendations for Other Services Other (comment)(Palliative)    Precautions / Restrictions Precautions Precautions: Fall       Mobility Bed Mobility Overal bed mobility: (P) Needs Assistance Bed Mobility: (P) Rolling Rolling: (P) Total assist;+2 for physical assistance;+2 for safety/equipment         General bed mobility comments: (P) rolling for peri care where flexiseal is leaking  Transfers                 General transfer comment: (P) NT this session    Balance                                           ADL either performed or assessed with clinical judgement    ADL Overall ADL's : Needs assistance/impaired     Grooming: Wash/dry face;Oral care;Bed level;Total assistance Grooming Details (indicate cue type and reason): used suction toothbrush with oral rise to clean out mouth, Pt open eyes for a few seconds but would not participate, would not participate in face washing either                               General ADL Comments: total assist for all self care at this time      Vision       Perception     Praxis      Cognition Arousal/Alertness: Lethargic Behavior During Therapy: Flat affect Overall Cognitive Status: Difficult to assess                                 General Comments: Pt did not respond to any questions, only opened eyes with thorough oral care - but shut almost immediately        Exercises Exercises: (P) Other exercises   Shoulder Instructions       General Comments elevated extremities and PROM for edema management    Pertinent Vitals/ Pain       Pain Assessment: Faces Faces Pain Scale: Hurts little more Pain Location:  generalized grimacing with ROM Pain Descriptors / Indicators: Grimacing Pain Intervention(s): Monitored during session  Home Living                                          Prior Functioning/Environment              Frequency  Min 2X/week        Progress Toward Goals  OT Goals(current goals can now be found in the care plan section)  Progress towards OT goals: Not progressing toward goals - comment(lethargy)     Plan Discharge plan remains appropriate;Frequency remains appropriate    Co-evaluation    PT/OT/SLP Co-Evaluation/Treatment: Yes Reason for Co-Treatment: Complexity of the patient's impairments (multi-system involvement) PT goals addressed during session: Strengthening/ROM OT goals addressed during session: ADL's and self-care      AM-PAC OT "6 Clicks" Daily Activity     Outcome Measure   Help from another  person eating meals?: Total Help from another person taking care of personal grooming?: Total Help from another person toileting, which includes using toliet, bedpan, or urinal?: Total Help from another person bathing (including washing, rinsing, drying)?: Total Help from another person to put on and taking off regular upper body clothing?: Total Help from another person to put on and taking off regular lower body clothing?: Total 6 Click Score: 6    End of Session Equipment Utilized During Treatment: Oxygen  OT Visit Diagnosis: Other abnormalities of gait and mobility (R26.89);Other symptoms and signs involving cognitive function;Cognitive communication deficit (R41.841);Muscle weakness (generalized) (M62.81) Symptoms and signs involving cognitive functions: Other cerebrovascular disease   Activity Tolerance Patient limited by lethargy   Patient Left in bed;with call bell/phone within reach;with bed alarm set   Nurse Communication Mobility status;Other (comment)(oral care done, Pt still sounds wet, leaking flexiseal)        Time: [id number] OT Time Calculation (min): 30 min  Charges: OT General Charges $OT Visit: 1 Visit OT Treatments $Therapeutic Activity: 8-22 mins  Sherryl MangesLaura Janyce Ellinger OTR/L Acute Rehabilitation Services Pager: [phone] Office: [phone]  Evern BioLaura J Chany Woolworth 10/11/2018, 5:07 PM

## 2018-10-11 NOTE — Procedures (Signed)
Cortrak  Person Inserting Tube:  Jakai Risse, RD Tube Type:  Cortrak - 43 inches Tube Location:  Right nare Initial Placement:  Stomach Secured by: Bridle Technique Used to Measure Tube Placement:  Documented cm marking at nare/ corner of mouth Cortrak Secured At:  63 cm   No x-ray is required. RN may begin using tube.   If the tube becomes dislodged please keep the tube and contact the Cortrak team at [email or web address] (password [email or web address]) for replacement.  If after hours and replacement cannot be delayed, place a NG tube and confirm placement with an abdominal x-ray.    Orian Amberg RD, LDN Clinical Nutrition Pager # - [phone]    

## 2018-10-11 NOTE — Progress Notes (Signed)
Patient back in Afib HR's in 120-130's. Patient BP 100/51 MAP 63.  PRN Cardizem not available in pyxis. Ordered dose from pharmacy. Paged provider to advise.  Awaiting orders. RN will continue to monitor.

## 2018-10-11 NOTE — Progress Notes (Signed)
Physical Therapy Treatment Patient Details Name: Jordan Reed MRN: [id number] DOB: 01/25/1951 Today's Date: 10/11/2018    History of Present Illness 68 y.o.femalewith past medical history of dementia/Alzheimer's, diabetes, HTN, hyperthyroid, anemia, former long term smokeradmitted on 4/15/2020after found slumped on porch and EMS (significant other is caregiver and had been hospitalized x 4 days at this time reportedly) called she was found to be in SVT and cardioverted.Required intubation (extubated 10/04/18) and with AKI with rhabdomyolysis and severe dehydration. Has had atrial fibrillation with RVR and continues with fever and concern for aspiration.    PT Comments    Pt unable to be aroused with various stimuli including noxious, except with suction toothbrush used to clean mouth and decrease spittle.   P/AA ROM was complete before/during and after peri care for loose stool.   Follow Up Recommendations  SNF;Supervision/Assistance - 24 hour     Equipment Recommendations  Other (comment)(TBA)    Recommendations for Other Services       Precautions / Restrictions Precautions Precautions: Fall    Mobility  Bed Mobility Overal bed mobility: Needs Assistance Bed Mobility: Rolling Rolling: Total assist;+2 for physical assistance;+2 for safety/equipment         General bed mobility comments: rolling for peri care where flexiseal is leaking  Transfers                 General transfer comment: NT this session  Ambulation/Gait                 Stairs             Wheelchair Mobility    Modified Rankin (Stroke Patients Only)       Balance                                            Cognition Arousal/Alertness: Lethargic Behavior During Therapy: Flat affect Overall Cognitive Status: Difficult to assess                                 General Comments: Pt did not respond to any questions, only opened eyes with  thorough oral care - but shut almost immediately      Exercises Other Exercises Other Exercises: P/AAROM to bil shd's, elbows, wrist, fingers, trunk, hips, knees and ankles.  Finished with rotation at the neck and repositioned.    General Comments General comments (skin integrity, edema, etc.): elevated extremities and PROM for edema management      Pertinent Vitals/Pain Pain Assessment: Faces Faces Pain Scale: Hurts little more Pain Location: generalized grimacing with ROM Pain Descriptors / Indicators: Grimacing Pain Intervention(s): Monitored during session    Home Living                      Prior Function            PT Goals (current goals can now be found in the care plan section) Acute Rehab PT Goals PT Goal Formulation: Patient unable to participate in goal setting Time For Goal Achievement: 10/19/18 Potential to Achieve Goals: Fair Progress towards PT goals: Not progressing toward goals - comment(unable to arouse suffieciently)    Frequency    Min 2X/week      PT Plan Current plan remains appropriate    Co-evaluation PT/OT/SLP Co-Evaluation/Treatment: Yes  Reason for Co-Treatment: Complexity of the patient's impairments (multi-system involvement) PT goals addressed during session: Strengthening/ROM OT goals addressed during session: ADL's and self-care      AM-PAC PT "6 Clicks" Mobility   Outcome Measure  Help needed turning from your back to your side while in a flat bed without using bedrails?: Total Help needed moving from lying on your back to sitting on the side of a flat bed without using bedrails?: Total Help needed moving to and from a bed to a chair (including a wheelchair)?: Total Help needed standing up from a chair using your arms (e.g., wheelchair or bedside chair)?: Total Help needed to walk in hospital room?: Total Help needed climbing 3-5 steps with a railing? : Total 6 Click Score: 6    End of Session   Activity  Tolerance: Other (comment)(unable to arouse the patient) Patient left: in bed;with call bell/phone within reach;Other (comment)(positioning boots) Nurse Communication: Mobility status PT Visit Diagnosis: Other abnormalities of gait and mobility (R26.89);Muscle weakness (generalized) (M62.81);Difficulty in walking, not elsewhere classified (R26.2);Pain Pain - part of body: (general rash)     Time: 4315-4008 PT Time Calculation (min) (ACUTE ONLY): 30 min  Charges:  $Therapeutic Activity: 8-22 mins                     10/11/2018  Celina Bing, PT Acute Rehabilitation Services [phone]  (pager) [phone]  (office)   Jordan Reed 10/11/2018, 5:16 PM

## 2018-10-11 NOTE — Progress Notes (Signed)
Pharmacy Antibiotic Note  Jordan Reed is a 68 y.o. female admitted on 10/02/2018 with fever.  Pharmacy has been consulted to dose [facility] for Stenotrophomonas PNA.  She is also on Flagyl to cover for aspiration.  Antibiotics adjusted for possible drug fever on Levaquin and rash on Zosyn.  Rash improving per RN.  Renal function and fever curve improving; WBC normalized.  Plan: Change Fortaz back to 2gm IV Q8H Flagyl 500mg  IV Q8H per MD Monitor renal fxn, clinical progress Monitor resolution of rash  Height: 5\' 5"  (165.1 cm) Weight: 178 lb 5.6 oz (80.9 kg) IBW/kg (Calculated) : 57  Temp (24hrs), Avg:99.3 F (37.4 C), Min:98.3 F (36.8 C), Max:99.7 F (37.6 C)  Recent Labs  Lab 10/07/18 0230 10/07/18 1154 10/08/18 0249 10/08/18 0734 10/09/18 0216 10/10/18 0234 10/11/18 0324  WBC 10.9* 11.7* 12.3*  --  13.9* 11.4* 9.2  CREATININE 0.74  --   --  0.83 1.39* 1.43* 1.10*    Estimated Creatinine Clearance: 52.2 mL/min (A) (by C-G formula based on SCr of 1.1 mg/dL (H)).    Allergies  Allergen Reactions  . Codeine Hives and Itching  . Penicillins Rash   Zosyn 4/17>> 4/19 Levaquin 4/19>>4/21 Vanc 4/17 x1 Fortaz 4/21>  MRSA PCR 4/15: neg Ucx 4/16: ng Bcx 4/15: GPR 1/4 bottles - corynebacterium  TA 4/17: mod steno - sen to LVQ Bcx 4/17: ngtd  Simmone Cape D. Laney Potash, PharmD, BCPS, BCCCP 10/11/2018, 9:15 AM

## 2018-10-11 NOTE — Progress Notes (Addendum)
Nutrition Follow-up  RD working remotely.  INTERVENTION:  Bolus TF regimen via Cortrak: - Jevity 1.2cal 1 can 5 times daily (total of 1185 ml/day) -Pro-stat 30 ml TID -Free water per MD, currently 200 ml q 6 hours  Tube feeding regimen provides1722kcal,111grams of protein, and 1760 of H2O (100% of needs).  - d/c Osmolite 1.2 cal order  NUTRITION DIAGNOSIS:   Increased nutrient needs related to wound healing as evidenced by estimated needs.  Ongoing, being addressed via TF  GOAL:   Patient will meet greater than or equal to 90% of their needs  Met via TF  MONITOR:   Diet advancement, Labs, Weight trends, TF tolerance, Skin, I & O's  REASON FOR ASSESSMENT:   Consult Assessment of nutrition requirement/status  ASSESSMENT:   Patient with no PMH on file. Presents this admission with ARF from dehydration/rhabdomyolysis.   Pt remains NPO with difficulty swallowing. Per SLP note on 4/23, "pt is very likely having difficulty swallowing against the firm NG tube in her pharynx, which may be causing poor secretion management and even aspiration of secretions. Would advise removal of NG either to replace with Cortrak or to initiate comfort feeding, depending on plan of care."  Discussed plan with RN and PCT NP. PCT NP ordered Cortrak consult with plans to remove NGT and insert small-bore, flexible Cortrak in its place for TF administration. SLP to re-evaluated pt tomorrow, 4/25.  Will switch TF formula to Jevity 1.2 to provide fiber given continued loose stools.  Reviewed Cortrak team note, Cortrak placed in right nare and secured with bridle at 63 cm with tip in stomach.  Weight up 30 lbs since admission. Suspect this is related to net positive fluid balance. Reviewed RN edema assessment. Pt with moderate pitting generalized edema, non-pitting edema to BUE, and mild pitting edema to BLE.  Medications reviewed and include: SSI, liquid MVI, IV abx IVF: NS @ 75  ml/hr  Labs reviewed: BUN 48 (H), creatinine 1.10 (H) CBG's: 247, 156, 163, 148, 202 x 24 hours  UOP: 825 ml x 24 hours Rectal tube: 800 ml x 24 hours I/O's: +11.3 L since admit  Diet Order:   Diet Order            Diet NPO time specified  Diet effective now              EDUCATION NEEDS:   Not appropriate for education at this time  Skin:  Skin Assessment: Skin Integrity Issues: DTI: right ear, right shoulder, right hip, left hip, right knee, right elbow Stage I: sacrum  Last BM:  10/11/18 800 ml x 24 hours via rectal tube  Height:   Ht Readings from Last 1 Encounters:  10/04/18 _0  (1.651 m)    Weight:   Wt Readings from Last 1 Encounters:  10/11/18 80.9 kg    Ideal Body Weight:  56.8 kg  BMI:  Body mass index is 29.68 kg/m.  Estimated Nutritional Needs:   Kcal:  1650-1850 kcal  Protein:  90-105 grams  Fluid:  >/= 1.6 L/day     Gaynell Face, MS, RD, LDN Inpatient Clinical Dietitian Pager: [phone] Weekend/After Hours: [phone]

## 2018-10-12 DIAGNOSIS — Z7189 Other specified counseling: Secondary | ICD-10-CM

## 2018-10-12 LAB — GLUCOSE, CAPILLARY
Glucose-Capillary: 136 mg/dL — ABNORMAL HIGH (ref 70–99)
Glucose-Capillary: 115 mg/dL — ABNORMAL HIGH (ref 70–99)
Glucose-Capillary: 174 mg/dL — ABNORMAL HIGH (ref 70–99)
Glucose-Capillary: 164 mg/dL — ABNORMAL HIGH (ref 70–99)
Glucose-Capillary: 189 mg/dL — ABNORMAL HIGH (ref 70–99)
Glucose-Capillary: 100 mg/dL — ABNORMAL HIGH (ref 70–99)
Glucose-Capillary: 121 mg/dL — ABNORMAL HIGH (ref 70–99)

## 2018-10-12 LAB — BASIC METABOLIC PANEL
Anion gap: 8 (ref 5–15)
BUN: 43 mg/dL — ABNORMAL HIGH (ref 8–23)
CO2: 19 mmol/L — ABNORMAL LOW (ref 22–32)
Calcium: 9.5 mg/dL (ref 8.9–10.3)
Chloride: 117 mmol/L — ABNORMAL HIGH (ref 98–111)
Creatinine, Ser: 0.97 mg/dL (ref 0.44–1.00)
GFR calc Af Amer: >60 mL/min (ref >60)
GFR calc non Af Amer: >60 mL/min (ref >60)
Glucose, Bld: 146 mg/dL — ABNORMAL HIGH (ref 70–99)
Potassium: 3.5 mmol/L (ref 3.5–5.1)
Sodium: 144 mmol/L (ref 135–145)

## 2018-10-12 LAB — CULTURE, BLOOD (ROUTINE X 2)
Culture: NO GROWTH
Culture: NO GROWTH
Special Requests: ADEQUATE

## 2018-10-12 LAB — CBC
HCT: 32.4 % — ABNORMAL LOW (ref 36.0–46.0)
Hemoglobin: 10.7 g/dL — ABNORMAL LOW (ref 12.0–15.0)
MCH: 29 pg (ref 26.0–34.0)
MCHC: 33 g/dL (ref 30.0–36.0)
MCV: 87.8 fL (ref 80.0–100.0)
Platelets: 278 10*3/uL (ref 150–400)
RBC: 3.69 MIL/uL — ABNORMAL LOW (ref 3.87–5.11)
RDW: 15.3 % (ref 11.5–15.5)
WBC: 14.4 10*3/uL — ABNORMAL HIGH (ref 4.0–10.5)
nRBC: 0 % (ref 0.0–0.2)

## 2018-10-12 MED ORDER — MEMANTINE HCL 5 MG PO TABS
5.0000 mg | ORAL_TABLET | Freq: Two times a day (BID) | ORAL | Status: DC
  Administered 2018-10-12 – 2018-10-16 (×8): 5 mg via ORAL
  Filled 2018-10-12 (×8): qty 1

## 2018-10-12 MED ORDER — DONEPEZIL HCL 10 MG PO TABS
10.0000 mg | ORAL_TABLET | Freq: Every day | ORAL | Status: DC
  Administered 2018-10-12 – 2018-10-15 (×4): 10 mg via ORAL
  Filled 2018-10-12 (×4): qty 1

## 2018-10-12 NOTE — Progress Notes (Signed)
  Speech Language Pathology Treatment: Dysphagia  Patient Details Name: Jordan Reed MRN: [id number] DOB: 10-Feb-1951 Today's Date: 10/12/2018 Time: 7414-2395 SLP Time Calculation (min) (ACUTE ONLY): 26 min  Assessment / Plan / Recommendation Clinical Impression  Severe delirium persists, but pt more alert this session than any previous times I have seen her. After repositioning upright and suctioning standing pharyngeal secretions pts vocal quality remained clear. Verbalizations unintelligible except for occasional "yes/no/dont do that/ok" in response to interventions. Pt initially minimally aware of PO but progressed gradually though acceptance and oral manipulation of ice to over 2 oz consumption of puree. Pt eventually responding to verbal command to open mouth to accept spoon, eyes open at times. Signs of aspiration still present with thin liquids, but puree tolerated without coughing or wet vocal quality. Called daughter and put her on the phone to further elicit response from pt, though pt remained delirious and non purposeful with speech. Pts mentation still not appropriate for diet or further PO intake. Will continue efforts.    HPI HPI: Patient with unknown past medical health. Found on porch slumped over. In ED she was intubated for airway protection, dehydration and rhabdomyolysis. She was extubated after 3 days on 10/04/18. She has no known history of swallowing difficulties.       SLP Plan  Continue with current plan of care       Recommendations  Diet recommendations: NPO                Plan: Continue with current plan of care       GO               [name], MA CCC-SLP  Acute Rehabilitation Services Pager [phone] Office [phone]  [name] 10/12/2018, 11:15 AM

## 2018-10-12 NOTE — Progress Notes (Signed)
PROGRESS NOTE    Jordan Reed  ZOX:[id number] DOB: [birth year] DOA: 10/02/2018 PCP: Patient, No Pcp Per   Brief Narrative: Per HPI: 68 year old female with advanced dementia, former long-term smoker, diabetes, hypothyroidismshe has no medical records with [place] or in care everywhere.she lives with her boyfriend who is also a caregiver who was unfortunately hospitalized 4 days prior to her admission, she was found slumped down in her front porch by neighbors, subsequently EMS was alerted she was found to be somnolent, noted to be in SVT, cardioverted  -In ED she remained tachycardic, narrow complex subsequently became agitated in the emergency room required sedation and subsequently intubation for airway protection, also had low grade temp of 100. -Laboratory evaluation consistent with dehydration rhabdomyolysis and therefore IV fluid resuscitation was given. Patient admitted to ICU for hypoxemic respiratory failure as well as metabolic encephalopathy related to AKI with rhabdomyolysis. She is also noted to have atrial fibrillation with RVR for which she was started on heparin drip as well as amiodarone.  Patient has since converted to sinus tachycardia and amiodarone has been discontinued.    On admission to ICU, noted to have fever overnight on 4/16 for which she was initially given vancomycin as well as Zosyn.  Vancomycin has been discontinued and patient remains on Zosyn with sputum cultures demonstrating gram-negative rods.  She continues to have persistent encephalopathy with some facial droop, MRI negative for CVA.  -4/17: extubated on 4/17 -4/18: transferred from PCCM to [facility] service -4/19 developed diffuse erythematous rash, likley drug rash, Zosyn was discontinued, she was started on levofloxacin instead -4/20: worsening fever to 101, CXR with bibasilar opacities concerning for Asp PNA -4/21: abx adjusted to ceftazidime and Flagyl. Ct ABD lower chest with small bilateral pleural effusion and  associated atelectasis or consolidation, no acute abdominal findings Patient remains confused, altered unable to take orally has been failing speech eval, has NG tube in place. Palliative care is following closely. 4/24_ Cortrak ng for feeing.  Subjective: Seen and examined this morning, responds to painful stimuli by opening eyes moving all her extremities and mumbles words, but does not follow commands.  Afebrile overnight but WBC increased to 14.4,  HR Has remained stable.  Assessment & Plan:  Acute respiratory failure with hypoxia.  Initially intubated for airway protection, extubated 4/17 and with aspiration pneumonia:CT abdomen 4/21 shows lower chest with small bilateral pleural effusion with associated consolidation/atelectasis. Currently on 2 L nasal cannula, and has been tachypneic and tachycardic. Continue nasal cannula 02 2 l, pulmonary toileting, aspiration precaution.  Aspiration pneumonia/fever: was admitted to ICU with low-grade fever initially along with agitation SVT.Started on IV Zosyn in the ICU, sputum cultures grew stenotrophomonas maltophilia of questionable significance, had severe drug rash and Zosyn was discontinued 4/21 and started on ceftazidime/Flagyl since 4/21. Repeat x-ray 4/20 shows bibasilar opacities concerning for aspiration. CT 4/21 w lower chest showing Small bilateral pleural effusion with associated atelectasis/consolidation.Cont on ceftazidime/Flagyl.  Overall has been afebrile but slight bump in WBC noted.  Continue with aspiration precaution cortrak tube feeding.  Drug rash: Still has erythematous rash involving two thirds of her body mostly in the abdomen arms thighs distal legs.seems to be slightly better.  Monitor.     AKI: Creatinine improved to 0.9. cut down ivf ivf and cont tube feed and free water. Has UE edema, avoid fluid overload.Avoid nephrotoxic medication.Had purewick and on bladder scan: pt was retaining urine >600 ml- foley placed 4/22. Recent  Labs  Lab 10/08/18 [phone] 10/09/18 0216  10/10/18 0234 10/11/18 0324 10/12/18 0236  CREATININE 0.83 1.39* 1.43* 1.10* 0.97   Diarrhea- having loose stool, c/w IVF, supportive care. No more fever. Some lecucotysis  Today. .On tube feed.  PAF: in and out of a fib. HR is now high normal, after increasing Cardizem 60 mg q6h and also on metoprolol 05 mg bid. TTE with preserved EF, hyperdynamic LV.   Watch for low blood pressure.Cont Eliquis.Noted to have suppressed TSH on admission, holding Synthroid and check TSH in 2 weeks.  Dementia with acute encephalopathy likely metabolic: Still remains encephalopathic, has not improved much. Per daughter has moderate to severe dementia at baseline on Aricept and Namenda at home and confused part of the day, able to recognize  immediate family members only. Continue supportive care.  Hypothyroidism history on Synthroid but TSH was less than 0.01.  Holding Synthroid check TSH in 2 weeks.  T2 Diabetes mellitus with hyperglycemia : Controlled.  Hemoglobin C 5.7.  Stable.  Continue sliding scale insulin. Recent Labs  Lab 10/11/18 1607 10/11/18 2309 10/12/18 0415 10/12/18 0630 10/12/18 1001  GLUCAP 184* 136* 115* 174* 164*    Long-term smoker/suspected COPD.  Continue supportive care.  DVT prophylaxis: eliquis Code Status: DNR.  Palliative care on board, for goals of care Family Communication: Discussed with the palliative care, who has spoken with patient's daughter who understands that patient may not improve, if she does improve plan is for SNF but if not may need to look into hospice, daughter has verbalized understanding. 4/24- I called daughter and updated her, she understands that patient is not getting well quickly, and is at risk for decompensation.   Disposition Plan: remains inpatient pending clinical improvement.  Consultants:  PCCM Palliative care medicine  Procedures:  CT ABD/Pelvis 10/08/18 Lower chest: Small bilateral pleural  effusions and associated atelectasis or consolidation, new compared to prior examination, No definite acute CT finding of the abdomen or pelvis to explain abdominal pain. 2. Distended urinary bladder with mild bilateral hydronephrosis. Correlate for urinary obstruction. 3.  Other chronic and incidental findings as detailed above.  Antimicrobials:  Vancomycin 4/17-4/17  Zosyn 4/17->4/21  Levaquin: 4/19>4/21  Ceftazidime/Flagyl: 4/21.>  Anti-infectives (From admission, onward)   Start     Dose/Rate Route Frequency Ordered Stop   10/11/18 1000  cefTAZidime (FORTAZ) 2 g in sodium chloride 0.9 % 100 mL IVPB     2 g 200 mL/hr over 30 Minutes Intravenous Every 8 hours 10/11/18 0916     10/10/18 0200  cefTAZidime (FORTAZ) 2 g in sodium chloride 0.9 % 100 mL IVPB  Status:  Discontinued     2 g 200 mL/hr over 30 Minutes Intravenous Every 12 hours 10/09/18 2122 10/11/18 0916   10/08/18 1200  cefTAZidime (FORTAZ) 2 g in sodium chloride 0.9 % 100 mL IVPB  Status:  Discontinued     2 g 200 mL/hr over 30 Minutes Intravenous Every 8 hours 10/08/18 1045 10/09/18 2122   10/08/18 1000  metroNIDAZOLE (FLAGYL) IVPB 500 mg     500 mg 100 mL/hr over 60 Minutes Intravenous Every 8 hours 10/08/18 0914     10/06/18 2200  levofloxacin (LEVAQUIN) IVPB 750 mg  Status:  Discontinued     750 mg 100 mL/hr over 90 Minutes Intravenous Every 24 hours 10/06/18 2111 10/08/18 0906   10/06/18 0400  vancomycin (VANCOCIN) 1,250 mg in sodium chloride 0.9 % 250 mL IVPB  Status:  Discontinued     1,250 mg 166.7 mL/hr over 90 Minutes Intravenous Every 48  hours 10/04/18 0336 10/04/18 1036   10/04/18 1200  piperacillin-tazobactam (ZOSYN) IVPB 3.375 g  Status:  Discontinued     3.375 g 12.5 mL/hr over 240 Minutes Intravenous Every 8 hours 10/04/18 0332 10/06/18 2111   10/04/18 0345  piperacillin-tazobactam (ZOSYN) IVPB 3.375 g     3.375 g 100 mL/hr over 30 Minutes Intravenous  Once 10/04/18 0332 10/04/18 0421   10/04/18 0345   vancomycin (VANCOCIN) 1,250 mg in sodium chloride 0.9 % 250 mL IVPB     1,250 mg 166.7 mL/hr over 90 Minutes Intravenous  Once 10/04/18 0332 10/04/18 0607   10/02/18 1100  cefTRIAXone (ROCEPHIN) 2 g in sodium chloride 0.9 % 100 mL IVPB     2 g 200 mL/hr over 30 Minutes Intravenous  Once 10/02/18 1051 10/02/18 1144       Objective: Vitals:   10/12/18 0110 10/12/18 0414 10/12/18 0737 10/12/18 0957  BP: (!) 117/49 (!) 118/42 118/61   Pulse: (!) 107 (!) 109 (!) 101 (!) 110  Resp: (!) 25 (!) 24 (!) 25   Temp:  97.7 F (36.5 C) 98.7 F (37.1 C)   TempSrc:  Oral Oral   SpO2: 98% 94% 97%   Weight:  81.9 kg    Height:        Intake/Output Summary (Last 24 hours) at 10/12/2018 1038 Last data filed at 10/12/2018 0417 Gross per 24 hour  Intake 100 ml  Output 1600 ml  Net -1500 ml   Filed Weights   10/10/18 0407 10/11/18 0434 10/12/18 0414  Weight: 79.8 kg 80.9 kg 81.9 kg   Weight change: 1 kg  Body mass index is 30.05 kg/m.  Intake/Output from previous day: 04/24 0701 - 04/25 0700 In: 100 [IV Piggyback:100] Out: 1600 [Urine:700; Stool:900] Intake/Output this shift: No intake/output data recorded.  Examination: General exam: Elderly, frail, not following commands lethargic HEENT:Oral mucosa moist, Ear/Nose WNL grossly, dentition normal. Respiratory system: Bilateral fair air entry, crackly sound from the THROAT, no use of accessory muscle but tachypneic. Cardiovascular system: regular rate and rhythm, S1 & S2 heard, No JVD/murmurs. Gastrointestinal system: Abdomen soft, non-tender, non-distended, BS +. No hepatosplenomegaly palpable. NGT+ Nervous System: Lethargic moves all extremities to pain, confused  Extremities:  UE edema, distal peripheral pulses palpable.  Skin: Erythematous rashes most of her body, improving MSK: Normal muscle bulk,tone, power  Medications:  Scheduled Meds: . apixaban  5 mg Oral BID  . chlorhexidine gluconate (MEDLINE KIT)  15 mL Mouth Rinse BID   . diltiazem  60 mg Per Tube Q6H  . feeding supplement (JEVITY 1.2 CAL)  237 mL Per Tube 5 X Daily  . feeding supplement (PRO-STAT SUGAR FREE 64)  30 mL Per Tube TID  . free water  200 mL Per Tube Q6H  . Gerhardt's butt cream   Topical BID  . insulin aspart  0-9 Units Subcutaneous TID WC  . insulin aspart  3 Units Subcutaneous 5 X Daily  . mouth rinse  15 mL Mouth Rinse BID  . metoprolol tartrate  50 mg Per Tube BID  . multivitamin  15 mL Oral Daily  . sodium chloride flush  3 mL Intravenous Once   Continuous Infusions: . sodium chloride 75 mL/hr at 10/10/18 1435  . cefTAZidime (FORTAZ)  IV 2 g (10/12/18 0857)  . metronidazole 500 mg (10/12/18 1004)    Data Reviewed: I have personally reviewed following labs and imaging studies  CBC: Recent Labs  Lab 10/07/18 1154 10/08/18 0249 10/09/18 0216 10/10/18 0234  10/11/18 0324 10/12/18 0236  WBC 11.7* 12.3* 13.9* 11.4* 9.2 14.4*  NEUTROABS 9.6*  --   --   --   --   --   HGB 13.1 13.4 12.7 11.3* 11.6* 10.7*  HCT 39.4 40.8 39.2 34.6* 34.9* 32.4*  MCV 86.6 85.5 87.9 88.3 87.9 87.8  PLT 188 223 268 214 225 419   Basic Metabolic Panel: Recent Labs  Lab 10/07/18 0230 10/08/18 0734 10/09/18 0216 10/10/18 0234 10/11/18 0324 10/12/18 0236  NA 141 144 141 142 143 144  K 3.3* 3.9 4.2 3.9 3.5 3.5  CL 112* 110 114* 115* 113* 117*  CO2 23 22 19* 20* 21* 19*  GLUCOSE 169* 149* 250* 215* 186* 146*  BUN 17 18 44* 53* 48* 43*  CREATININE 0.74 0.83 1.39* 1.43* 1.10* 0.97  CALCIUM 9.1 9.4 9.7 9.3 9.8 9.5  MG 1.6*  --  2.1  --   --   --    GFR: Estimated Creatinine Clearance: 59.5 mL/min (by C-G formula based on SCr of 0.97 mg/dL). Liver Function Tests: Recent Labs  Lab 10/06/18 0252 10/08/18 0734 10/09/18 0216  AST 31 35 25  ALT 39 34 33  ALKPHOS 74 60 70  BILITOT 0.9 0.5 0.4  PROT 5.3* 4.6* 4.6*  ALBUMIN 2.2* 1.8* 1.7*   No results for input(s): LIPASE, AMYLASE in the last 168 hours. No results for input(s): AMMONIA in  the last 168 hours. Coagulation Profile: No results for input(s): INR, PROTIME in the last 168 hours. Cardiac Enzymes: Recent Labs  Lab 10/06/18 0252  CKTOTAL 114   BNP (last 3 results) No results for input(s): PROBNP in the last 8760 hours. HbA1C: No results for input(s): HGBA1C in the last 72 hours. CBG: Recent Labs  Lab 10/11/18 1607 10/11/18 2309 10/12/18 0415 10/12/18 0630 10/12/18 1001  GLUCAP 184* 136* 115* 174* 164*   Lipid Profile: No results for input(s): CHOL, HDL, LDLCALC, TRIG, CHOLHDL, LDLDIRECT in the last 72 hours. Thyroid Function Tests: No results for input(s): TSH, T4TOTAL, FREET4, T3FREE, THYROIDAB in the last 72 hours. Anemia Panel: No results for input(s): VITAMINB12, FOLATE, FERRITIN, TIBC, IRON, RETICCTPCT in the last 72 hours. Sepsis Labs: Recent Labs  Lab 10/08/18 1040  PROCALCITON <0.10    Recent Results (from the past 240 hour(s))  Culture, blood (Routine x 2)     Status: Abnormal   Collection Time: 10/02/18 11:00 AM  Result Value Ref Range Status   Specimen Description BLOOD LEFT ARM  Final   Special Requests   Final    BOTTLES DRAWN AEROBIC AND ANAEROBIC Blood Culture results may not be optimal due to an inadequate volume of blood received in culture bottles   Culture  Setup Time   Final    GRAM POSITIVE RODS CRITICAL RESULT CALLED TO, READ BACK BY AND VERIFIED WITH: RHRMD V BRTK _0  10/05/18 BY S GEZAHEGN    Culture (A)  Final    CORYNEBACTERIUM, GROUP JK Standardized susceptibility testing for this organism is not available. Performed at [facility], [place] [address], [place], [place] [address]    Report Status 10/07/2018 FINAL  Final  Culture, blood (Routine x 2)     Status: None   Collection Time: 10/02/18 11:03 AM  Result Value Ref Range Status   Specimen Description BLOOD LEFT HAND  Final   Special Requests   Final    BOTTLES DRAWN AEROBIC AND ANAEROBIC Blood Culture results may not be optimal due to an inadequate  volume  of blood received in culture bottles   Culture   Final    NO GROWTH 5 DAYS Performed at [facility], [place] [address], [place], [place] [address]    Report Status 10/07/2018 FINAL  Final  MRSA PCR Screening     Status: None   Collection Time: 10/02/18 11:49 PM  Result Value Ref Range Status   MRSA by PCR NEGATIVE NEGATIVE Final    Comment:        The GeneXpert MRSA Assay (FDA approved for NASAL specimens only), is one component of a comprehensive MRSA colonization surveillance program. It is not intended to diagnose MRSA infection nor to guide or monitor treatment for MRSA infections. Performed at [facility], [place] [address], [place], [place] [address]   Urine Culture     Status: None   Collection Time: 10/03/18 12:11 PM  Result Value Ref Range Status   Specimen Description URINE, RANDOM  Final   Special Requests NONE  Final   Culture   Final    NO GROWTH Performed at [facility], [place] [address], [place], [place] [address]    Report Status 10/05/2018 FINAL  Final  Culture, respiratory (non-expectorated)     Status: None   Collection Time: 10/04/18  3:25 AM  Result Value Ref Range Status   Specimen Description TRACHEAL ASPIRATE  Final   Special Requests NONE  Final   Gram Stain   Final    ABUNDANT WBC PRESENT, PREDOMINANTLY PMN FEW GRAM NEGATIVE RODS Performed at [facility], [place] [address] [place], [place] [address]    Culture MODERATE STENOTROPHOMONAS MALTOPHILIA  Final   Report Status 10/06/2018 FINAL  Final   Organism ID, Bacteria STENOTROPHOMONAS MALTOPHILIA  Final      Susceptibility   Stenotrophomonas maltophilia - MIC*    LEVOFLOXACIN 0.5 SENSITIVE Sensitive     TRIMETH/SULFA 80 RESISTANT Resistant     * MODERATE STENOTROPHOMONAS MALTOPHILIA  Culture, blood (Routine X 2) w Reflex to ID Panel     Status: None   Collection Time: 10/04/18  3:48 AM  Result Value Ref Range Status   Specimen Description BLOOD LEFT ANTECUBITAL   Final   Special Requests   Final    BOTTLES DRAWN AEROBIC ONLY Blood Culture results may not be optimal due to an inadequate volume of blood received in culture bottles   Culture   Final    NO GROWTH 5 DAYS Performed at [facility], [place] [address] [place], [place] [address]    Report Status 10/09/2018 FINAL  Final  Culture, blood (Routine X 2) w Reflex to ID Panel     Status: None   Collection Time: 10/04/18  3:48 AM  Result Value Ref Range Status   Specimen Description BLOOD LEFT HAND  Final   Special Requests   Final    BOTTLES DRAWN AEROBIC ONLY Blood Culture results may not be optimal due to an inadequate volume of blood received in culture bottles   Culture   Final    NO GROWTH 5 DAYS Performed at [facility], [place] [address], [place], [place] [address]    Report Status 10/09/2018 FINAL  Final  Culture, blood (routine x 2)     Status: None (Preliminary result)   Collection Time: 10/07/18 11:50 AM  Result Value Ref Range Status   Specimen Description BLOOD LEFT HAND  Final   Special Requests   Final    BOTTLES DRAWN AEROBIC  ONLY Blood Culture adequate volume   Culture   Final    NO GROWTH 4 DAYS Performed at [place] [address], [place], [place] [address]    Report Status PENDING  Incomplete  Culture, blood (routine x 2)     Status: None (Preliminary result)   Collection Time: 10/07/18 11:55 AM  Result Value Ref Range Status   Specimen Description BLOOD LEFT HAND  Final   Special Requests   Final    BOTTLES DRAWN AEROBIC ONLY Blood Culture results may not be optimal due to an inadequate volume of blood received in culture bottles   Culture   Final    NO GROWTH 4 DAYS Performed at [facility], [place] [address] [place], [place] [address]    Report Status PENDING  Incomplete      Radiology Studies: No results found.   LOS: 10 days   Time spent: More than 50% of that time was spent in counseling and/or coordination of care.  Antonieta Pert, MD [facility]  10/12/2018, 10:38 AM

## 2018-10-13 LAB — GLUCOSE, CAPILLARY
Glucose-Capillary: 150 mg/dL — ABNORMAL HIGH (ref 70–99)
Glucose-Capillary: 122 mg/dL — ABNORMAL HIGH (ref 70–99)
Glucose-Capillary: 217 mg/dL — ABNORMAL HIGH (ref 70–99)
Glucose-Capillary: 190 mg/dL — ABNORMAL HIGH (ref 70–99)
Glucose-Capillary: 121 mg/dL — ABNORMAL HIGH (ref 70–99)

## 2018-10-13 LAB — BASIC METABOLIC PANEL
Anion gap: 6 (ref 5–15)
BUN: 32 mg/dL — ABNORMAL HIGH (ref 8–23)
CO2: 21 mmol/L — ABNORMAL LOW (ref 22–32)
Calcium: 9.5 mg/dL (ref 8.9–10.3)
Chloride: 119 mmol/L — ABNORMAL HIGH (ref 98–111)
Creatinine, Ser: 0.85 mg/dL (ref 0.44–1.00)
GFR calc Af Amer: >60 mL/min (ref >60)
GFR calc non Af Amer: >60 mL/min (ref >60)
Glucose, Bld: 140 mg/dL — ABNORMAL HIGH (ref 70–99)
Potassium: 3.4 mmol/L — ABNORMAL LOW (ref 3.5–5.1)
Sodium: 146 mmol/L — ABNORMAL HIGH (ref 135–145)

## 2018-10-13 LAB — CBC
HCT: 32 % — ABNORMAL LOW (ref 36.0–46.0)
Hemoglobin: 10.4 g/dL — ABNORMAL LOW (ref 12.0–15.0)
MCH: 28.6 pg (ref 26.0–34.0)
MCHC: 32.5 g/dL (ref 30.0–36.0)
MCV: 87.9 fL (ref 80.0–100.0)
Platelets: 310 10*3/uL (ref 150–400)
RBC: 3.64 MIL/uL — ABNORMAL LOW (ref 3.87–5.11)
RDW: 15.7 % — ABNORMAL HIGH (ref 11.5–15.5)
WBC: 14.1 10*3/uL — ABNORMAL HIGH (ref 4.0–10.5)
nRBC: 0 % (ref 0.0–0.2)

## 2018-10-13 MED ORDER — BACID PO TABS
1.0000 | ORAL_TABLET | Freq: Two times a day (BID) | ORAL | Status: DC
  Administered 2018-10-13 – 2018-10-16 (×6): 1
  Filled 2018-10-13 (×7): qty 1

## 2018-10-13 MED ORDER — POTASSIUM CHLORIDE 20 MEQ/15ML (10%) PO SOLN
20.0000 meq | Freq: Once | ORAL | Status: AC
  Administered 2018-10-13: 20 meq
  Filled 2018-10-13: qty 15

## 2018-10-13 MED ORDER — FLORANEX PO PACK
1.0000 g | PACK | Freq: Two times a day (BID) | ORAL | Status: DC
  Administered 2018-10-13: 1 g via NASOGASTRIC
  Filled 2018-10-13 (×3): qty 1

## 2018-10-13 MED ORDER — FREE WATER
200.0000 mL | Status: DC
  Administered 2018-10-13 – 2018-10-15 (×13): 200 mL

## 2018-10-13 NOTE — Progress Notes (Signed)
Pt has order for lactobcillus granules. Granules will not easily pass through NG tube. Contacted pharmacy to see if med could be switched to liquid. Advise by pharmacy no liquid form of the medication.  Paged provider to advise. Provider returned called and advise to switch to liquid form through pharmacy. Advise had already contacted pharmacy. Provider requested to have pharmacy check for another form of probiotic. If pharmacy could not change. Discontinue medication per verbal read back.  Contacted pharmacy explain providers request who advise would switch medication. If any issues with new medication to further advise pharmacy. RN awaiting new medication.

## 2018-10-13 NOTE — Progress Notes (Signed)
PROGRESS NOTE    Jordan Reed  XTK:[id number] DOB: [birth year] DOA: 10/02/2018 PCP: Patient, No Pcp Per   Brief Narrative: Per HPI: 68 year old female with advanced dementia, former long-term smoker, diabetes, hypothyroidismshe has no medical records with [place] or in care everywhere.she lives with her boyfriend who is also a caregiver who was unfortunately hospitalized 4 days prior to her admission, she was found slumped down in her front porch by neighbors, subsequently EMS was alerted she was found to be somnolent, noted to be in SVT, cardioverted  -In ED she remained tachycardic, narrow complex subsequently became agitated in the emergency room required sedation and subsequently intubation for airway protection, also had low grade temp of 100. -Laboratory evaluation consistent with dehydration rhabdomyolysis and therefore IV fluid resuscitation was given. Patient admitted to ICU for hypoxemic respiratory failure as well as metabolic encephalopathy related to AKI with rhabdomyolysis. She is also noted to have atrial fibrillation with RVR for which she was started on heparin drip as well as amiodarone.  Patient has since converted to sinus tachycardia and amiodarone has been discontinued.    On admission to ICU, noted to have fever overnight on 4/16 for which she was initially given vancomycin as well as Zosyn.  Vancomycin has been discontinued and patient remains on Zosyn with sputum cultures demonstrating gram-negative rods.  She continues to have persistent encephalopathy with some facial droop, MRI negative for CVA.  -4/17: extubated on 4/17 -4/18: transferred from PCCM to [facility] service -4/19 developed diffuse erythematous rash, likley drug rash, Zosyn was discontinued, she was started on levofloxacin instead -4/20: worsening fever to 101, CXR with bibasilar opacities concerning for Asp PNA -4/21: abx adjusted to ceftazidime and Flagyl. Ct ABD lower chest with small bilateral pleural effusion and  associated atelectasis or consolidation, no acute abdominal findings Patient remains confused, altered unable to take orally has been failing speech eval, has NG tube in place. Palliative care is following closely. 4/24_ Cortrak ng for feeing.  Subjective: Awake since 4 am. Does not follow commands, mumbling words. moveing all her extremities to pain/touch. Foley + w good uop, flexiseal + w diarrhea. WBC 14.4->14.  HR Has remained stable and has been afebrile.  Assessment & Plan:  Acute respiratory failure with hypoxia.  Initially intubated for airway protection, extubated 4/17 and with aspiration pneumonia:CT abdomen 4/21 shows lower chest with small bilateral pleural effusion with associated consolidation/atelectasis.  Continue on nasal cannula, 2 L, wean oxygen as tolerated, continue pulmonary toileting, aspiration precaution.    Aspiration pneumonia/fever: was admitted to ICU with low-grade fever initially along with agitation SVT.Started on IV Zosyn in the ICU, sputum cultures grew stenotrophomonas maltophilia of questionable significance, had severe drug rash and Zosyn was discontinued 4/21 and started on ceftazidime/Flagyl since 4/21. Repeat x-ray 4/20 shows bibasilar opacities concerning for aspiration.CT 4/21 w lower chest showing Small bilateral pleural effusion with associated atelectasis/consolidation. Remains on ceftazidime/Flagyl.Has been afebrile since 4/21.  WBC up as below.  Continue current plan. Recent Labs  Lab 10/09/18 0216 10/10/18 0234 10/11/18 0324 10/12/18 0236 10/13/18 0214  WBC 13.9* 11.4* 9.2 14.4* 14.1*   Drug rash: Still has erythematous rash involving two thirds of her body mostly in the abdomen arms thighs distal legs.appears to be improving.     AKI: Creatinine improved, will stop ivf and cont with free water through NG tube. Has UE edema, avoid fluid overload.Avoid nephrotoxic medication. Recent Labs  Lab 10/09/18 0216 10/10/18 0234 10/11/18 0324  10/12/18 0236 10/13/18 0214  CREATININE  1.39* 1.43* 1.10* 0.97 0.85   Urine retention : had purewick and on bladder scan: retaining urine >600 ml- foley placed 4/22.   Diarrhea- having loose stool, c/w IVF, supportive care. No more fever. Some lecucotysis  Present, she is on tube feed. Cont probiotics  PAF: in and out of a fib. HR is is controlled now, cont on increased dose of Cardizem 60 mg q6h and metoprolol 50 mg bid. TTE with preserved EF, hyperdynamic LV.Cont Eliquis.Noted to have suppressed TSH on admission, holding Synthroid and check TSH in 2 weeks.  Dementia with acute encephalopathy likely metabolic: Still remains encephalopathic, has not improved much.Is alert awake but not following commands, mumbling words difficult to comprehend. Per daughter has moderate to severe dementia at baseline on Aricept and Namenda at home and confused part of the day, able to recognize  immediate family members only. Continue supportive care.  Hypothyroidism history on Synthroid but TSH was less than 0.01.  Holding Synthroid check TSH in 2 weeks.  T2 Diabetes mellitus with hyperglycemia : Controlled.  Hemoglobin C 5.7.  Stable.  Continue sliding scale insulin. Recent Labs  Lab 10/11/18 1607 10/11/18 2309 10/12/18 0415 10/12/18 0630 10/12/18 1001  GLUCAP 184* 136* 115* 174* 164*   Long-term smoker/suspected COPD.  Continue supportive care.  DVT prophylaxis: eliquis Code Status: DNR.  Palliative care on board, for goals of care Family Communication: Discussed with the palliative care, who has spoken with patient's daughter who understands that patient may not improve, if she does improve plan is for SNF but if not may need to look into hospice, daughter has verbalized understanding. 4/24- I called daughter and updated her, she understands that patient is not getting well quickly, and is at risk for decompensation.   Disposition Plan: remains inpatient pending clinical improvement.   Consultants:  PCCM Palliative care medicine  Procedures:  CT ABD/Pelvis 10/08/18 Lower chest: Small bilateral pleural effusions and associated atelectasis or consolidation, new compared to prior examination, No definite acute CT finding of the abdomen or pelvis to explain abdominal pain. 2. Distended urinary bladder with mild bilateral hydronephrosis. Correlate for urinary obstruction. 3.  Other chronic and incidental findings as detailed above.  Antimicrobials:  Vancomycin 4/17-4/17  Zosyn 4/17->4/21  Levaquin: 4/19>4/21  Ceftazidime/Flagyl: 4/21.>  Anti-infectives (From admission, onward)   Start     Dose/Rate Route Frequency Ordered Stop   10/11/18 1000  cefTAZidime (FORTAZ) 2 g in sodium chloride 0.9 % 100 mL IVPB     2 g 200 mL/hr over 30 Minutes Intravenous Every 8 hours 10/11/18 0916     10/10/18 0200  cefTAZidime (FORTAZ) 2 g in sodium chloride 0.9 % 100 mL IVPB  Status:  Discontinued     2 g 200 mL/hr over 30 Minutes Intravenous Every 12 hours 10/09/18 2122 10/11/18 0916   10/08/18 1200  cefTAZidime (FORTAZ) 2 g in sodium chloride 0.9 % 100 mL IVPB  Status:  Discontinued     2 g 200 mL/hr over 30 Minutes Intravenous Every 8 hours 10/08/18 1045 10/09/18 2122   10/08/18 1000  metroNIDAZOLE (FLAGYL) IVPB 500 mg     500 mg 100 mL/hr over 60 Minutes Intravenous Every 8 hours 10/08/18 0914     10/06/18 2200  levofloxacin (LEVAQUIN) IVPB 750 mg  Status:  Discontinued     750 mg 100 mL/hr over 90 Minutes Intravenous Every 24 hours 10/06/18 2111 10/08/18 0906   10/06/18 0400  vancomycin (VANCOCIN) 1,250 mg in sodium chloride 0.9 % 250 mL IVPB  Status:  Discontinued     1,250 mg 166.7 mL/hr over 90 Minutes Intravenous Every 48 hours 10/04/18 0336 10/04/18 1036   10/04/18 1200  piperacillin-tazobactam (ZOSYN) IVPB 3.375 g  Status:  Discontinued     3.375 g 12.5 mL/hr over 240 Minutes Intravenous Every 8 hours 10/04/18 0332 10/06/18 2111   10/04/18 0345  piperacillin-tazobactam  (ZOSYN) IVPB 3.375 g     3.375 g 100 mL/hr over 30 Minutes Intravenous  Once 10/04/18 0332 10/04/18 0421   10/04/18 0345  vancomycin (VANCOCIN) 1,250 mg in sodium chloride 0.9 % 250 mL IVPB     1,250 mg 166.7 mL/hr over 90 Minutes Intravenous  Once 10/04/18 0332 10/04/18 0607   10/02/18 1100  cefTRIAXone (ROCEPHIN) 2 g in sodium chloride 0.9 % 100 mL IVPB     2 g 200 mL/hr over 30 Minutes Intravenous  Once 10/02/18 1051 10/02/18 1144       Objective: Vitals:   10/12/18 2332 10/13/18 0000 10/13/18 0345 10/13/18 0728  BP: 101/73  117/62 (!) 103/45  Pulse: 99 91 96 98  Resp:   (!) 23 (!) 24  Temp: 99.2 F (37.3 C)  98.8 F (37.1 C) 99 F (37.2 C)  TempSrc: Axillary  Axillary Axillary  SpO2: 97% 97% 97% 97%  Weight:   80.1 kg   Height:        Intake/Output Summary (Last 24 hours) at 10/13/2018 0754 Last data filed at 10/13/2018 0650 Gross per 24 hour  Intake 660 ml  Output 3200 ml  Net -2540 ml   Filed Weights   10/11/18 0434 10/12/18 0414 10/13/18 0345  Weight: 80.9 kg 81.9 kg 80.1 kg   Weight change: -1.8 kg  Body mass index is 29.39 kg/m.  Intake/Output from previous day: 04/25 0701 - 04/26 0700 In: 660 [NG/GT:440; IV Piggyback:200] Out: 3200 [Urine:1350; Stool:1850] Intake/Output this shift: No intake/output data recorded.  Examination:  General exam: Elderly, frail, not following commands lethargic HEENT:Oral mucosa moist, Ear/Nose WNL grossly, dentition normal. Respiratory system: Bilateral fair air entry, crackly sound from the THROAT, no use of accessory muscle but tachypneic. Cardiovascular system: regular rate and rhythm, S1 & S2 heard, No JVD/murmurs. Gastrointestinal system: Abdomen soft, non-tender, non-distended, BS +. No hepatosplenomegaly palpable. NGT+ Nervous System: Lethargic moves all extremities to pain, confused  Extremities:  UE edema, distal peripheral pulses palpable.  Skin: Erythematous rashes most of her body, improving MSK: Normal  muscle bulk,tone, power  Medications:  Scheduled Meds: . apixaban  5 mg Oral BID  . chlorhexidine gluconate (MEDLINE KIT)  15 mL Mouth Rinse BID  . diltiazem  60 mg Per Tube Q6H  . donepezil  10 mg Oral QHS  . feeding supplement (JEVITY 1.2 CAL)  237 mL Per Tube 5 X Daily  . feeding supplement (PRO-STAT SUGAR FREE 64)  30 mL Per Tube TID  . free water  200 mL Per Tube Q6H  . Gerhardt's butt cream   Topical BID  . insulin aspart  0-9 Units Subcutaneous TID WC  . insulin aspart  3 Units Subcutaneous 5 X Daily  . lactobacillus  1 g Per NG tube BID WC  . mouth rinse  15 mL Mouth Rinse BID  . memantine  5 mg Oral BID  . metoprolol tartrate  50 mg Per Tube BID  . multivitamin  15 mL Oral Daily  . sodium chloride flush  3 mL Intravenous Once   Continuous Infusions: . sodium chloride 30 mL/hr at 10/12/18 1243  .  cefTAZidime (FORTAZ)  IV 2 g (10/13/18 0356)  . metronidazole 500 mg (10/13/18 8841)    Data Reviewed: I have personally reviewed following labs and imaging studies  CBC: Recent Labs  Lab 10/07/18 1154  10/09/18 0216 10/10/18 0234 10/11/18 0324 10/12/18 0236 10/13/18 0214  WBC 11.7*   < > 13.9* 11.4* 9.2 14.4* 14.1*  NEUTROABS 9.6*  --   --   --   --   --   --   HGB 13.1   < > 12.7 11.3* 11.6* 10.7* 10.4*  HCT 39.4   < > 39.2 34.6* 34.9* 32.4* 32.0*  MCV 86.6   < > 87.9 88.3 87.9 87.8 87.9  PLT 188   < > 268 214 225 278 310   < > = values in this interval not displayed.   Basic Metabolic Panel: Recent Labs  Lab 10/07/18 0230  10/09/18 0216 10/10/18 0234 10/11/18 0324 10/12/18 0236 10/13/18 0214  NA 141   < > 141 142 143 144 146*  K 3.3*   < > 4.2 3.9 3.5 3.5 3.4*  CL 112*   < > 114* 115* 113* 117* 119*  CO2 23   < > 19* 20* 21* 19* 21*  GLUCOSE 169*   < > 250* 215* 186* 146* 140*  BUN 17   < > 44* 53* 48* 43* 32*  CREATININE 0.74   < > 1.39* 1.43* 1.10* 0.97 0.85  CALCIUM 9.1   < > 9.7 9.3 9.8 9.5 9.5  MG 1.6*  --  2.1  --   --   --   --    < > =  values in this interval not displayed.   GFR: Estimated Creatinine Clearance: 67.1 mL/min (by C-G formula based on SCr of 0.85 mg/dL). Liver Function Tests: Recent Labs  Lab 10/08/18 0734 10/09/18 0216  AST 35 25  ALT 34 33  ALKPHOS 60 70  BILITOT 0.5 0.4  PROT 4.6* 4.6*  ALBUMIN 1.8* 1.7*   No results for input(s): LIPASE, AMYLASE in the last 168 hours. No results for input(s): AMMONIA in the last 168 hours. Coagulation Profile: No results for input(s): INR, PROTIME in the last 168 hours. Cardiac Enzymes: No results for input(s): CKTOTAL, CKMB, CKMBINDEX, TROPONINI in the last 168 hours. BNP (last 3 results) No results for input(s): PROBNP in the last 8760 hours. HbA1C: No results for input(s): HGBA1C in the last 72 hours. CBG: Recent Labs  Lab 10/12/18 1139 10/12/18 1554 10/12/18 2108 10/13/18 0346 10/13/18 0631  GLUCAP 189* 100* 121* 150* 122*   Lipid Profile: No results for input(s): CHOL, HDL, LDLCALC, TRIG, CHOLHDL, LDLDIRECT in the last 72 hours. Thyroid Function Tests: No results for input(s): TSH, T4TOTAL, FREET4, T3FREE, THYROIDAB in the last 72 hours. Anemia Panel: No results for input(s): VITAMINB12, FOLATE, FERRITIN, TIBC, IRON, RETICCTPCT in the last 72 hours. Sepsis Labs: Recent Labs  Lab 10/08/18 1040  PROCALCITON <0.10    Recent Results (from the past 240 hour(s))  Urine Culture     Status: None   Collection Time: 10/03/18 12:11 PM  Result Value Ref Range Status   Specimen Description URINE, RANDOM  Final   Special Requests NONE  Final   Culture   Final    NO GROWTH Performed at [facility], [address], [place], [place] [address]    Report Status 10/05/2018 FINAL  Final  Culture, respiratory (non-expectorated)     Status: None   Collection Time: 10/04/18  3:25  AM  Result Value Ref Range Status   Specimen Description TRACHEAL ASPIRATE  Final   Special Requests NONE  Final   Gram Stain   Final    ABUNDANT WBC PRESENT,  PREDOMINANTLY PMN FEW GRAM NEGATIVE RODS Performed at [facility], [address] [place], [place] [address]    Culture MODERATE STENOTROPHOMONAS MALTOPHILIA  Final   Report Status 10/06/2018 FINAL  Final   Organism ID, Bacteria STENOTROPHOMONAS MALTOPHILIA  Final      Susceptibility   Stenotrophomonas maltophilia - MIC*    LEVOFLOXACIN 0.5 SENSITIVE Sensitive     TRIMETH/SULFA 80 RESISTANT Resistant     * MODERATE STENOTROPHOMONAS MALTOPHILIA  Culture, blood (Routine X 2) w Reflex to ID Panel     Status: None   Collection Time: 10/04/18  3:48 AM  Result Value Ref Range Status   Specimen Description BLOOD LEFT ANTECUBITAL  Final   Special Requests   Final    BOTTLES DRAWN AEROBIC ONLY Blood Culture results may not be optimal due to an inadequate volume of blood received in culture bottles   Culture   Final    NO GROWTH 5 DAYS Performed at [facility], [place] [address] [place], [place] [address]    Report Status 10/09/2018 FINAL  Final  Culture, blood (Routine X 2) w Reflex to ID Panel     Status: None   Collection Time: 10/04/18  3:48 AM  Result Value Ref Range Status   Specimen Description BLOOD LEFT HAND  Final   Special Requests   Final    BOTTLES DRAWN AEROBIC ONLY Blood Culture results may not be optimal due to an inadequate volume of blood received in culture bottles   Culture   Final    NO GROWTH 5 DAYS Performed at [facility], [place] [address] [place], [place] [address]    Report Status 10/09/2018 FINAL  Final  Culture, blood (routine x 2)     Status: None   Collection Time: 10/07/18 11:50 AM  Result Value Ref Range Status   Specimen Description BLOOD LEFT HAND  Final   Special Requests   Final    BOTTLES DRAWN AEROBIC ONLY Blood Culture adequate volume   Culture   Final    NO GROWTH 5 DAYS Performed at [facility], [place] [address], [place], [place] [address]    Report Status 10/12/2018 FINAL  Final  Culture, blood (routine x 2)      Status: None   Collection Time: 10/07/18 11:55 AM  Result Value Ref Range Status   Specimen Description BLOOD LEFT HAND  Final   Special Requests   Final    BOTTLES DRAWN AEROBIC ONLY Blood Culture results may not be optimal due to an inadequate volume of blood received in culture bottles   Culture   Final    NO GROWTH 5 DAYS Performed at [facility], [place] [address] [place], [place] [address]    Report Status 10/12/2018 FINAL  Final      Radiology Studies: No results found.   LOS: 11 days   Time spent: More than 50% of that time was spent in counseling and/or coordination of care.  Antonieta Pert, MD [facility]  10/13/2018, 7:54 AM

## 2018-10-14 DIAGNOSIS — I4891 Unspecified atrial fibrillation: Secondary | ICD-10-CM

## 2018-10-14 DIAGNOSIS — Z515 Encounter for palliative care: Secondary | ICD-10-CM

## 2018-10-14 DIAGNOSIS — J9601 Acute respiratory failure with hypoxia: Secondary | ICD-10-CM

## 2018-10-14 LAB — GLUCOSE, CAPILLARY
Glucose-Capillary: 121 mg/dL — ABNORMAL HIGH (ref 70–99)
Glucose-Capillary: 122 mg/dL — ABNORMAL HIGH (ref 70–99)
Glucose-Capillary: 116 mg/dL — ABNORMAL HIGH (ref 70–99)
Glucose-Capillary: 92 mg/dL (ref 70–99)
Glucose-Capillary: 86 mg/dL (ref 70–99)

## 2018-10-14 LAB — CBC
HCT: 29.9 % — ABNORMAL LOW (ref 36.0–46.0)
Hemoglobin: 9.8 g/dL — ABNORMAL LOW (ref 12.0–15.0)
MCH: 28.5 pg (ref 26.0–34.0)
MCHC: 32.8 g/dL (ref 30.0–36.0)
MCV: 86.9 fL (ref 80.0–100.0)
Platelets: 310 10*3/uL (ref 150–400)
RBC: 3.44 MIL/uL — ABNORMAL LOW (ref 3.87–5.11)
RDW: 15.7 % — ABNORMAL HIGH (ref 11.5–15.5)
WBC: 10.3 10*3/uL (ref 4.0–10.5)
nRBC: 0 % (ref 0.0–0.2)

## 2018-10-14 LAB — BASIC METABOLIC PANEL
Anion gap: 5 (ref 5–15)
BUN: 30 mg/dL — ABNORMAL HIGH (ref 8–23)
CO2: 21 mmol/L — ABNORMAL LOW (ref 22–32)
Calcium: 9 mg/dL (ref 8.9–10.3)
Chloride: 116 mmol/L — ABNORMAL HIGH (ref 98–111)
Creatinine, Ser: 0.77 mg/dL (ref 0.44–1.00)
GFR calc Af Amer: >60 mL/min (ref >60)
GFR calc non Af Amer: >60 mL/min (ref >60)
Glucose, Bld: 157 mg/dL — ABNORMAL HIGH (ref 70–99)
Potassium: 3.4 mmol/L — ABNORMAL LOW (ref 3.5–5.1)
Sodium: 142 mmol/L (ref 135–145)

## 2018-10-14 MED ORDER — PANCRELIPASE (LIP-PROT-AMYL) 10440-39150 UNITS PO TABS
20880.0000 [IU] | ORAL_TABLET | Freq: Once | ORAL | Status: AC
  Administered 2018-10-14: 20880 [IU]
  Filled 2018-10-14: qty 2

## 2018-10-14 MED ORDER — SODIUM BICARBONATE 650 MG PO TABS
650.0000 mg | ORAL_TABLET | Freq: Once | ORAL | Status: AC
  Administered 2018-10-14: 11:00:00 650 mg
  Filled 2018-10-14: qty 1

## 2018-10-14 MED ORDER — POTASSIUM CHLORIDE 20 MEQ/15ML (10%) PO SOLN
20.0000 meq | Freq: Every day | ORAL | Status: DC
  Administered 2018-10-14 – 2018-10-16 (×3): 20 meq
  Filled 2018-10-14 (×3): qty 15

## 2018-10-14 NOTE — Progress Notes (Signed)
  Speech Language Pathology Treatment: Dysphagia  Patient Details Name: Jordan Reed MRN: [id number] DOB: 1951-06-14 Today's Date: 10/14/2018 Time: 5003-7048 SLP Time Calculation (min) (ACUTE ONLY): 12 min  Assessment / Plan / Recommendation Clinical Impression  Pt does not show significant change from prior SLP visits - if anything, she is drowsier this afternoon despite repositioning, oral care, and other multimodal stimulation provided by SLP. She does not accept POs despite Max cues and encouragement, although small amounts of water would trickle into her mouth without pt awareness. She is not appropriate for a PO diet. Will continue to follow, although prognosis for return to PO diet is subject to any improvement in mentation.   HPI HPI: Patient with unknown past medical health. Found on porch slumped over. In ED she was intubated for airway protection, dehydration and rhabdomyolysis. She was extubated after 3 days on 10/04/18. She has no known history of swallowing difficulties.       SLP Plan  Continue with current plan of care       Recommendations  Diet recommendations: NPO Medication Administration: Via alternative means                Oral Care Recommendations: Oral care QID Follow up Recommendations: Skilled Nursing facility SLP Visit Diagnosis: Dysphagia, unspecified (R13.10) Plan: Continue with current plan of care       GO                Jordan Reed 10/14/2018, 12:52 PM  Ivar Drape, M.A. CCC-SLP Acute Herbalist [phone] Office [phone]

## 2018-10-14 NOTE — Progress Notes (Signed)
Received call from daughter. Inetta Fermo is requesting her mother's care only be discussed with daughters Inetta Fermo and Zella Ball) and granddaughter Godfrey Pick). She does NOT want care team to discuss plan of care with patient's significant other, Harold. Password: [email or web address]. Notified RN.   NO CHARGE  Vennie Homans, FNP-C Palliative Medicine Team  Phone: [phone] Fax: [phone]

## 2018-10-14 NOTE — Progress Notes (Signed)
Cortak tube clogged up,  Unclogging protocol done but still unsuccessful., RD  Made aware, will be coming to check on the tube.

## 2018-10-14 NOTE — Care Management Important Message (Signed)
Important Message  Patient Details  Name: Jordan Reed MRN: [id number] Date of Birth: Oct 22, 1950   Medicare Important Message Given:  Yes    [name] 10/14/2018, 10:45 AM

## 2018-10-14 NOTE — Progress Notes (Signed)
PROGRESS NOTE    Jordan Reed  JKD:[id number] DOB: 10-26-1950 DOA: 10/02/2018 PCP: Patient, No Pcp Per   Brief Narrative: Per HPI: 68 year old female with advanced dementia, former long-term smoker, diabetes, hypothyroidismshe has no medical records with [place] or in care everywhere.she lives with her boyfriend who is also a caregiver who was unfortunately hospitalized 4 days prior to her admission, she was found slumped down in her front porch by neighbors, subsequently EMS was alerted she was found to be somnolent, noted to be in SVT, cardioverted  -In ED she remained tachycardic, narrow complex subsequently became agitated in the emergency room required sedation and subsequently intubation for airway protection, also had low grade temp of 100. -Laboratory evaluation consistent with dehydration rhabdomyolysis and therefore IV fluid resuscitation was given. Patient admitted to ICU for hypoxemic respiratory failure as well as metabolic encephalopathy related to AKI with rhabdomyolysis. She is also noted to have atrial fibrillation with RVR for which she was started on heparin drip as well as amiodarone.  Patient has since converted to sinus tachycardia and amiodarone has been discontinued.    On admission to ICU, noted to have fever overnight on 4/16 for which she was initially given vancomycin as well as Zosyn.  Vancomycin has been discontinued and patient remains on Zosyn with sputum cultures demonstrating gram-negative rods.  She continues to have persistent encephalopathy with some facial droop, MRI negative for CVA.  -4/17: extubated on 4/17 -4/18: transferred from PCCM to [facility] service -4/19 developed diffuse erythematous rash, likley drug rash, Zosyn was discontinued, she was started on levofloxacin instead -4/20: worsening fever to 101, CXR with bibasilar opacities concerning for Asp PNA -4/21: abx adjusted to ceftazidime and Flagyl. Ct ABD lower chest with small bilateral pleural effusion and  associated atelectasis or consolidation, no acute abdominal findings Patient remains confused, altered unable to take orally has been failing speech eval, has NG tube in place. Palliative care is following closely. 4/24_ [facility] ng for feeding.  Subjective: Seen/examinedalert,awake, speaking words " what are you doing honey", but no conversing and not following commands moving extremities.  Assessment & Plan:  Acute respiratory failure with hypoxia.  Initially intubated for airway protection, extubated 4/17 and with aspiration pneumonia:CT abdomen 4/21 shows lower chest with small bilateral pleural effusion with associated consolidation/atelectasis. On 2l [place], stable. continue pulmonary toileting, aspiration precaution.    Aspiration pneumonia/fever: was admitted to ICU with low-grade fever initially along with agitation SVT.Started on IV Zosyn in the ICU, sputum cultures grew stenotrophomonas maltophilia of questionable significance, had severe drug rash and Zosyn was discontinued 4/21 and started on ceftazidime/Flagyl since 4/21. Repeat x-ray 4/20 shows bibasilar opacities concerning for aspiration.CT 4/21 w lower chest showing Small bilateral pleural effusion with associated atelectasis/consolidation. Remains on ceftazidime/Flagyl and remains afebrile since 4/21.  wbc stable today. Repeat CXR in am. Recent Labs  Lab 10/10/18 0234 10/11/18 0324 10/12/18 0236 10/13/18 0214 10/14/18 0212  WBC 11.4* 9.2 14.4* 14.1* 10.3   Drug rash: improved.   AKI: resolved. Cont free water via ngt Recent Labs  Lab 10/10/18 0234 10/11/18 0324 10/12/18 0236 10/13/18 0214 10/14/18 0212  CREATININE 1.43* 1.10* 0.97 0.85 0.77   Hypokalemia we will continue on KCl replacement daily via NG tube  Urine retention : had purewick and on bladder scan: retaining urine >600 ml- foley placed 4/22. Remove once clinically better.  Diarrhea- having loose stool, c/w IVF, supportive care. No more fever. Some  lecucotysis  Present, she is on tube feed. Unable to  use probiotics via NGT  PAF: in and out of a fib. HR is is controlled now in 90s. Cont Cardizem 60 mg q6h and metoprolol 50 mg bid. TTE with preserved EF, hyperdynamic LV.Cont Eliquis.Noted to have suppressed TSH on admission, holding Synthroid and check TSH in 2 weeks.  Dementia with acute encephalopathy likely metabolic: more alert,awake, but confused w dementia, not following commands.Per daughter has moderate to severe dementia at baseline on Aricept and Namenda at home and confused part of the day, able to recognize  immediate family members only. Continue supportive care.  Hypothyroidism history on Synthroid but TSH was less than 0.01.  Holding Synthroid check TSH in 2 weeks.  T2 Diabetes mellitus with hyperglycemia : Controlled.  Hemoglobin C 5.7.  Stable.  Continue sliding scale insulin. Recent Labs  Lab 10/11/18 1607 10/11/18 2309 10/12/18 0415 10/12/18 0630 10/12/18 1001  GLUCAP 184* 136* 115* 174* 164*   Long-term smoker/suspected COPD.  Continue supportive care.  DVT prophylaxis: eliquis Code Status: DNR.  Palliative care on board, for goals of care Family Communication: Discussed with the palliative care, who has spoken with patient's daughter who understands that patient may not improve, if she does improve plan is for SNF but if not may need to look into hospice, daughter has verbalized understanding. 4/2-called daughter and updated her, she understands that patient is not getting well quickly, and is at risk for decompensation. 4/27: tried to call daughter to update-unable to reach will reattempt.   Disposition Plan: remains inpatient pending clinical improvement.  Consultants:  PCCM Palliative care medicine  Procedures:  CT ABD/Pelvis 10/08/18 Lower chest: Small bilateral pleural effusions and associated atelectasis or consolidation, new compared to prior examination, No definite acute CT finding of the abdomen or  pelvis to explain abdominal pain. 2. Distended urinary bladder with mild bilateral hydronephrosis. Correlate for urinary obstruction. 3.  Other chronic and incidental findings as detailed above.  Antimicrobials:  Vancomycin 4/17-4/17  Zosyn 4/17->4/21  Levaquin: 4/19>4/21  Ceftazidime/Flagyl: 4/21.>  Anti-infectives (From admission, onward)   Start     Dose/Rate Route Frequency Ordered Stop   10/11/18 1000  cefTAZidime (FORTAZ) 2 g in sodium chloride 0.9 % 100 mL IVPB     2 g 200 mL/hr over 30 Minutes Intravenous Every 8 hours 10/11/18 0916     10/10/18 0200  cefTAZidime (FORTAZ) 2 g in sodium chloride 0.9 % 100 mL IVPB  Status:  Discontinued     2 g 200 mL/hr over 30 Minutes Intravenous Every 12 hours 10/09/18 2122 10/11/18 0916   10/08/18 1200  cefTAZidime (FORTAZ) 2 g in sodium chloride 0.9 % 100 mL IVPB  Status:  Discontinued     2 g 200 mL/hr over 30 Minutes Intravenous Every 8 hours 10/08/18 1045 10/09/18 2122   10/08/18 1000  metroNIDAZOLE (FLAGYL) IVPB 500 mg     500 mg 100 mL/hr over 60 Minutes Intravenous Every 8 hours 10/08/18 0914     10/06/18 2200  levofloxacin (LEVAQUIN) IVPB 750 mg  Status:  Discontinued     750 mg 100 mL/hr over 90 Minutes Intravenous Every 24 hours 10/06/18 2111 10/08/18 0906   10/06/18 0400  vancomycin (VANCOCIN) 1,250 mg in sodium chloride 0.9 % 250 mL IVPB  Status:  Discontinued     1,250 mg 166.7 mL/hr over 90 Minutes Intravenous Every 48 hours 10/04/18 0336 10/04/18 1036   10/04/18 1200  piperacillin-tazobactam (ZOSYN) IVPB 3.375 g  Status:  Discontinued     3.375 g 12.5 mL/hr over  240 Minutes Intravenous Every 8 hours 10/04/18 0332 10/06/18 2111   10/04/18 0345  piperacillin-tazobactam (ZOSYN) IVPB 3.375 g     3.375 g 100 mL/hr over 30 Minutes Intravenous  Once 10/04/18 0332 10/04/18 0421   10/04/18 0345  vancomycin (VANCOCIN) 1,250 mg in sodium chloride 0.9 % 250 mL IVPB     1,250 mg 166.7 mL/hr over 90 Minutes Intravenous  Once  10/04/18 0332 10/04/18 0607   10/02/18 1100  cefTRIAXone (ROCEPHIN) 2 g in sodium chloride 0.9 % 100 mL IVPB     2 g 200 mL/hr over 30 Minutes Intravenous  Once 10/02/18 1051 10/02/18 1144       Objective: Vitals:   10/13/18 1900 10/13/18 2322 10/14/18 0327 10/14/18 0720  BP: (!) 113/45 (!) 112/48 (!) 115/53 (!) 105/47  Pulse: 98 99 87 93  Resp: (!) 29 (!) 30 (!) 22 (!) 27  Temp: 98.2 F (36.8 C) 99.4 F (37.4 C) 99 F (37.2 C) 99.3 F (37.4 C)  TempSrc: Axillary Axillary Axillary Axillary  SpO2: 97% 100% 96% 97%  Weight:   83.6 kg   Height:        Intake/Output Summary (Last 24 hours) at 10/14/2018 0821 Last data filed at 10/14/2018 0724 Gross per 24 hour  Intake 1200 ml  Output 1250 ml  Net -50 ml   Filed Weights   10/12/18 0414 10/13/18 0345 10/14/18 0327  Weight: 81.9 kg 80.1 kg 83.6 kg   Weight change: 3.5 kg  Body mass index is 30.67 kg/m.  Intake/Output from previous day: 04/26 0701 - 04/27 0700 In: 1200 [IV Piggyback:1200] Out: 800 [Urine:800] Intake/Output this shift: Total I/O In: -  Out: 450 [Urine:450]  Examination: General exam: alert,awake, oriented x0-1, not conversing. NGT+ HEENT:Oral mucosa moist, Ear/Nose WNL grossly, dentition normal. Respiratory system: Bilateral equal air entry, no crackles and wheezing, no use of accessory muscle, non tender on palpation. Cardiovascular system: regular rate and rhythm, S1 & S2 heard, No JVD/murmurs. Gastrointestinal system: Abdomen soft, non-tender, non-distended, BS +. No hepatosplenomegaly palpable. Nervous System:Alert, awake and oriented at baseline. Able to move UE and LE, sensation intact. Extremities:  UE edema, distal peripheral pulses palpable.  Skin: No rashes,no icterus. MSK: Normal muscle bulk,tone, power Wound as described above Medications:  Scheduled Meds: . apixaban  5 mg Oral BID  . chlorhexidine gluconate (MEDLINE KIT)  15 mL Mouth Rinse BID  . diltiazem  60 mg Per Tube Q6H  .  donepezil  10 mg Oral QHS  . feeding supplement (JEVITY 1.2 CAL)  237 mL Per Tube 5 X Daily  . feeding supplement (PRO-STAT SUGAR FREE 64)  30 mL Per Tube TID  . free water  200 mL Per Tube Q4H  . Gerhardt's butt cream   Topical BID  . insulin aspart  0-9 Units Subcutaneous TID WC  . insulin aspart  3 Units Subcutaneous 5 X Daily  . lactobacillus acidophilus  1 tablet Per Tube BID  . mouth rinse  15 mL Mouth Rinse BID  . memantine  5 mg Oral BID  . metoprolol tartrate  50 mg Per Tube BID  . multivitamin  15 mL Oral Daily  . potassium chloride  20 mEq Per Tube Daily  . sodium chloride flush  3 mL Intravenous Once   Continuous Infusions: . cefTAZidime (FORTAZ)  IV Stopped (10/14/18 0334)  . metronidazole Stopped (10/14/18 0449)    Data Reviewed: I have personally reviewed following labs and imaging studies  CBC: Recent Labs  Lab 10/07/18 1154  10/10/18 0234 10/11/18 0324 10/12/18 0236 10/13/18 0214 10/14/18 0212  WBC 11.7*   < > 11.4* 9.2 14.4* 14.1* 10.3  NEUTROABS 9.6*  --   --   --   --   --   --   HGB 13.1   < > 11.3* 11.6* 10.7* 10.4* 9.8*  HCT 39.4   < > 34.6* 34.9* 32.4* 32.0* 29.9*  MCV 86.6   < > 88.3 87.9 87.8 87.9 86.9  PLT 188   < > 214 225 278 310 310   < > = values in this interval not displayed.   Basic Metabolic Panel: Recent Labs  Lab 10/09/18 0216 10/10/18 0234 10/11/18 0324 10/12/18 0236 10/13/18 0214 10/14/18 0212  NA 141 142 143 144 146* 142  K 4.2 3.9 3.5 3.5 3.4* 3.4*  CL 114* 115* 113* 117* 119* 116*  CO2 19* 20* 21* 19* 21* 21*  GLUCOSE 250* 215* 186* 146* 140* 157*  BUN 44* 53* 48* 43* 32* 30*  CREATININE 1.39* 1.43* 1.10* 0.97 0.85 0.77  CALCIUM 9.7 9.3 9.8 9.5 9.5 9.0  MG 2.1  --   --   --   --   --    GFR: Estimated Creatinine Clearance: 72.8 mL/min (by C-G formula based on SCr of 0.77 mg/dL). Liver Function Tests: Recent Labs  Lab 10/08/18 0734 10/09/18 0216  AST 35 25  ALT 34 33  ALKPHOS 60 70  BILITOT 0.5 0.4  PROT  4.6* 4.6*  ALBUMIN 1.8* 1.7*   No results for input(s): LIPASE, AMYLASE in the last 168 hours. No results for input(s): AMMONIA in the last 168 hours. Coagulation Profile: No results for input(s): INR, PROTIME in the last 168 hours. Cardiac Enzymes: No results for input(s): CKTOTAL, CKMB, CKMBINDEX, TROPONINI in the last 168 hours. BNP (last 3 results) No results for input(s): PROBNP in the last 8760 hours. HbA1C: No results for input(s): HGBA1C in the last 72 hours. CBG: Recent Labs  Lab 10/13/18 1128 10/13/18 1619 10/13/18 2205 10/14/18 0341 10/14/18 0610  GLUCAP 217* 190* 121* 121* 122*   Lipid Profile: No results for input(s): CHOL, HDL, LDLCALC, TRIG, CHOLHDL, LDLDIRECT in the last 72 hours. Thyroid Function Tests: No results for input(s): TSH, T4TOTAL, FREET4, T3FREE, THYROIDAB in the last 72 hours. Anemia Panel: No results for input(s): VITAMINB12, FOLATE, FERRITIN, TIBC, IRON, RETICCTPCT in the last 72 hours. Sepsis Labs: Recent Labs  Lab 10/08/18 1040  PROCALCITON <0.10    Recent Results (from the past 240 hour(s))  Culture, blood (routine x 2)     Status: None   Collection Time: 10/07/18 11:50 AM  Result Value Ref Range Status   Specimen Description BLOOD LEFT HAND  Final   Special Requests   Final    BOTTLES DRAWN AEROBIC ONLY Blood Culture adequate volume   Culture   Final    NO GROWTH 5 DAYS Performed at [facility], [address] [place], [place] [address]    Report Status 10/12/2018 FINAL  Final  Culture, blood (routine x 2)     Status: None   Collection Time: 10/07/18 11:55 AM  Result Value Ref Range Status   Specimen Description BLOOD LEFT HAND  Final   Special Requests   Final    BOTTLES DRAWN AEROBIC ONLY Blood Culture results may not be optimal due to an inadequate volume of blood received in culture bottles   Culture   Final    NO GROWTH 5 DAYS  Performed at [facility], [place] [address] [place], [place] [address]    Report  Status 10/12/2018 FINAL  Final      Radiology Studies: No results found.   LOS: 12 days   Time spent: More than 50% of that time was spent in counseling and/or coordination of care.  Antonieta Pert, MD [facility]  10/14/2018, 8:21 AM

## 2018-10-14 NOTE — Progress Notes (Signed)
Occupational Therapy Treatment Patient Details Name: Jordan Reed MRN: [id number] DOB: February 19, 1951 Today's Date: 10/14/2018    History of present illness 68 y.o.femalewith past medical history of dementia/Alzheimer's, diabetes, HTN, hyperthyroid, anemia, former long term smokeradmitted on 4/15/2020after found slumped on porch and EMS (significant other is caregiver and had been hospitalized x 4 days at this time reportedly) called she was found to be in SVT and cardioverted.Required intubation (extubated 10/04/18) and with AKI with rhabdomyolysis and severe dehydration. Has had atrial fibrillation with RVR and continues with fever and concern for aspiration.   OT comments  Pt remains lethargic, does not follow commands or show meaningful interaction. OT played 50's rock and roll and performed PROM for all 4 extremities and neck. Repositioned in bed, and cleaned up leaking flexi seal. Pt would open eyes, but does not interact with therapist. OT will continue to follow acutely, current POC remains appropriate. Recommend continued collaboration with Palliative Care.    Follow Up Recommendations  SNF;Supervision/Assistance - 24 hour    Equipment Recommendations  Other (comment)(defer to next venue)    Recommendations for Other Services Other (comment)(Palliative)    Precautions / Restrictions Precautions Precautions: Fall       Mobility Bed Mobility                  Transfers                 General transfer comment: NT this session    Balance                                           ADL either performed or assessed with clinical judgement   ADL Overall ADL's : Needs assistance/impaired     Grooming: Wash/dry face;Total assistance;Bed level                       Toileting- Clothing Manipulation and Hygiene: Total assistance;Bed level Toileting - Clothing Manipulation Details (indicate cue type and reason): flexiseal leaking,  cleaned up as best I could with one person total A       General ADL Comments: total assist for all self care at this time      Vision       Perception     Praxis      Cognition Arousal/Alertness: Lethargic Behavior During Therapy: Flat affect Overall Cognitive Status: Difficult to assess                                 General Comments: Pt would open eyes about 33% of the time- would not keep them open. did not follow any directions throughout session. seemed completely unaware of surroundings, participation in therapy etc.        Exercises Exercises: Other exercises General Exercises - Upper Extremity Shoulder Flexion: PROM;Both;10 reps;Supine Shoulder Extension: PROM;Both;10 reps;Supine Elbow Flexion: PROM;Both;10 reps;Supine Elbow Extension: PROM;Both;10 reps;Supine Digit Composite Flexion: PROM;Both;10 reps;Supine Composite Extension: PROM;Both;10 reps;Supine General Exercises - Lower Extremity Heel Slides: PROM;10 reps Hip ABduction/ADduction: PROM;10 reps Other Exercises Other Exercises: neck rotation, from L<>R   Shoulder Instructions       General Comments elevated extremities, PROM for BUE, BLE and neck    Pertinent Vitals/ Pain       Pain Assessment: Faces Faces Pain Scale: Hurts little more Pain Location:  generalized grimacing while being moved Pain Descriptors / Indicators: Grimacing Pain Intervention(s): Limited activity within patient's tolerance;Monitored during session;Repositioned  Home Living                                          Prior Functioning/Environment              Frequency  Min 2X/week        Progress Toward Goals  OT Goals(current goals can now be found in the care plan section)  Progress towards OT goals: Not progressing toward goals - comment(lethargy)  Acute Rehab OT Goals Patient Stated Goal: unable to state  Plan Discharge plan remains appropriate;Frequency remains  appropriate    Co-evaluation                 AM-PAC OT "6 Clicks" Daily Activity     Outcome Measure   Help from another person eating meals?: Total Help from another person taking care of personal grooming?: Total Help from another person toileting, which includes using toliet, bedpan, or urinal?: Total Help from another person bathing (including washing, rinsing, drying)?: Total Help from another person to put on and taking off regular upper body clothing?: Total Help from another person to put on and taking off regular lower body clothing?: Total 6 Click Score: 6    End of Session Equipment Utilized During Treatment: Oxygen(2L)  OT Visit Diagnosis: Other abnormalities of gait and mobility (R26.89);Other symptoms and signs involving cognitive function;Cognitive communication deficit (R41.841);Muscle weakness (generalized) (M62.81) Symptoms and signs involving cognitive functions: Other cerebrovascular disease   Activity Tolerance Patient limited by lethargy   Patient Left in bed;with call bell/phone within reach   Nurse Communication Mobility status;Other (comment)(flexi seal leaking)        Time: [id number] OT Time Calculation (min): 24 min  Charges: OT General Charges $OT Visit: 1 Visit OT Treatments $Self Care/Home Management : 8-22 mins $Therapeutic Exercise: 8-22 mins  Sherryl MangesLaura Tuwanda Vokes OTR/L Acute Rehabilitation Services Pager: [phone] Office: [phone]   Evern BioLaura J Melodye Swor 10/14/2018, 3:55 PM

## 2018-10-14 NOTE — Progress Notes (Signed)
flexiseal came out, placed back , tolerated well, bag changed

## 2018-10-14 NOTE — Progress Notes (Signed)
[facility] Tube Team Note:   RD received consult for clogged tube. Clog was identified just in front of nasal bridle clip. RD was able to unclog tube by working clog out with fingers and flushing repeatedly. Tube remains at 63cm marking. Tube is now ok to use.   If the tube becomes dislodged please keep the tube and contact the [facility] team at [email or web address] (password [email or web address]) for replacement.  If after hours and replacement cannot be delayed, place a NG tube and confirm placement with an abdominal x-ray.   Betsey Holiday MS, RD, LDN Pager #- [phone] Office#- [phone] After Hours Pager: [phone]

## 2018-10-14 NOTE — Progress Notes (Signed)
Daily Progress Note   Patient Name: Jordan Reed       Date: 10/14/2018 DOB: Jul 16, 1950  Age: 68 y.o. MRN#: [id number] Attending Physician: Jordan Pert, MD Primary Care Physician: Patient, No Pcp Per Admit Date: 10/02/2018   Reason for Consultation/Follow-up: Establishing goals of care  Subjective: Patient will acknowledge NP at bedside with incomprehensible mumbling. She lightly squeezes NP hands. Otherwise does not open eyes or follow commands this afternoon. No s/s of pain or distress.   GOC:   Spoke with RN and Dr. Lupita Reed regarding plan of care and current cognitive status.   F/u with daughter, Jordan Reed via telephone. Reassured Jordan Reed of continued PMT follow.   Update on current clinical status and plan of care including diagnoses, interventions, and poor prognosis. Discussed dementia disease trajectory.   Jordan Reed tearful throughout the conversation and shares that she was been discussing [name] with her sister Jordan Reed) and granddaughter Jordan Reed). Jordan Reed shares that the patient's father and mother both died from advanced dementia/Alzheimer's disease and Brean witnessed their health deterioration from this disease. She has frequently told her family "I don't want to be like that" and Jordan Reed confirms wishes against prolonged aggressive interventions with ongoing decline or lack of improvement.   Jordan Reed acknowledges that it would be best if her mother could go home and be in a familiar surrounding but that this is not realistic or possible with how sick she is. Also that she likely would end of long-term care at a SNF. We did discuss with poor functional status and willingness to work with therapy, she likely would not be candidate for rehab at facility.   Discussed allowing her mother another 24-48 hours per recommendation of  attending. Jordan Reed agreeable to this plan but also seems to be realistic in the fact that her mother may need hospice facility upon discharge.   I did explain that her mothers cortrak was clogged while I was at bedside. Discussed replacing cortrak if RN unable to unclog OR Tina's decision to discontinue cortrak if nursing unable to use. Jordan Reed tells me she would NOT wish for her mother to go through another cortrak placement if nursing staff unable to use current cortrak. Relayed to RN.   Answered questions and concerns for daughter. Jordan Reed has PMT contact information. We plan to try and skype with her mother tomorrow morning.   Length of Stay: 12  Current Medications: Scheduled Meds:   apixaban  5 mg Oral BID   chlorhexidine gluconate (MEDLINE KIT)  15 mL Mouth Rinse BID   diltiazem  60 mg Per Tube Q6H   donepezil  10 mg Oral QHS   feeding supplement (JEVITY 1.2 CAL)  237 mL Per Tube 5 X Daily   feeding supplement (PRO-STAT SUGAR FREE 64)  30 mL Per Tube TID   free water  200 mL Per Tube Q4H   Gerhardt's butt cream   Topical BID   insulin aspart  0-9 Units Subcutaneous TID WC   insulin aspart  3 Units Subcutaneous 5 X Daily   lactobacillus acidophilus  1 tablet Per Tube BID   mouth rinse  15 mL Mouth Rinse BID   memantine  5 mg Oral BID   metoprolol  tartrate  50 mg Per Tube BID   multivitamin  15 mL Oral Daily   potassium chloride  20 mEq Per Tube Daily   sodium chloride flush  3 mL Intravenous Once    Continuous Infusions:  cefTAZidime (FORTAZ)  IV Stopped (10/14/18 0334)   metronidazole 500 mg (10/14/18 1106)    PRN Meds: acetaminophen (TYLENOL) oral liquid 160 mg/5 mL, metoprolol tartrate  Vital Signs: BP (!) 105/47 (BP Location: Left Arm)    Pulse 93    Temp 98.8 F (37.1 C) (Axillary)    Resp (!) 27    Ht '5\' 5"'$  (1.651 m)    Wt 83.6 kg    SpO2 97%    BMI 30.67 kg/m  SpO2: SpO2: 97 % O2 Device: O2 Device: Nasal Cannula O2 Flow Rate: O2 Flow Rate (L/min): 2  L/min  Intake/output summary:   Intake/Output Summary (Last 24 hours) at 10/14/2018 1301 Last data filed at 10/14/2018 1200 Gross per 24 hour  Intake 1497 ml  Output 1050 ml  Net 447 ml   LBM: Last BM Date: 10/13/18 Baseline Weight: Weight: 77.1 kg Most recent weight: Weight: 83.6 kg  Physical exam:  Lethargic, wakes to voice but falls back to sleep. Does not follow commands or show meaningful interaction. Pulmonary: Lungs rhonchi bilateral. No tachypnea, accessory muscle use, or respiratory distress. Cardiovascular: Tachycardia Abdominal: Abdomen soft, non-tender       Palliative Assessment/Data: PPS 20%    Flowsheet Rows     Most Recent Value  Intake Tab  Referral Department  Hospitalist  Unit at Time of Referral  Med/Surg Unit  Palliative Care Primary Diagnosis  Cardiac  Date Notified  10/09/18  Palliative Care Type  New Palliative care  Reason for referral  Clarify Goals of Care  Date of Admission  10/02/18  Date first seen by Palliative Care  10/09/18  # of days Palliative referral response time  0 Day(s)  # of days IP prior to Palliative referral  7  Clinical Assessment  Psychosocial & Spiritual Assessment  Palliative Care Outcomes      Patient Active Problem List   Diagnosis Date Noted   Goals of care, counseling/discussion    Early onset Alzheimer's dementia without behavioral disturbance ([place])    Palliative care encounter    Pressure injury of skin 10/04/2018   AKI (acute kidney injury) ([place])    Acute encephalopathy    Acute respiratory failure ([facility])    Altered mental status    Rhabdomyolysis 10/02/2018    Palliative Care Assessment & Plan   HPI: 69 y.o. female  with past medical history of dementia/Alzheimer's, diabetes, HTN, hyperthyroid, anemia, former long term smoker admitted on 10/02/2018 after found slumped on porch and EMS (significant other is caregiver and had been hospitalized x 4 days at this time reportedly) called she was found  to be in SVT and cardioverted. Required intubation (extubated 10/04/18) and with AKI with rhabdomyolysis and severe dehydration. Has had atrial fibrillation with RVR and continues with fever and concern for aspiration. Continues to fail swallow study and high risk aspiration.     Assessment: Acute respiratory failure with hypoxia Aspiration pneumonia AKI PAF Dementia Acute encephalopathy  Recommendations/Plan:  Continue DNR  Continue current plan of care and medical interventions.   F/u with attending and daughter regarding [place]. Will allow 24-48 hours for improvement. If no improvement in condition, likely transition to comfort measures and hospice referral. Daughter confirms her mother's previous wishes against life-prolonging interventions.   If  cortrak becomes clogged again or dislodged, daughter does NOT wish for cortrak to be replaced.   Code Status:  DNR   Prognosis:   Poor prognosis  Discharge Planning:  To Be Determined - SNF vs residential hospice  Care plan was discussed with RN, daughter Jordan Reed, and Dr. Lupita Reed   Thank you for allowing the Palliative Medicine Team to assist in the care of this patient.   Total Time 60 Prolonged Time Billed  no    The above conversation was completed via telephone due to the visitor restrictions during the COVID-19 pandemic. Thorough chart review and discussion with necessary members of the care team was completed as part of assessment. All issues were discussed and addressed but no physical exam was performed.     Greater than 50%  of this time was spent counseling and coordinating care related to the above assessment and plan.  Ihor Dow, DNP, FNP-C Palliative Medicine Team  Phone: [phone] Fax: [phone]

## 2018-10-15 ENCOUNTER — Inpatient Hospital Stay (HOSPITAL_COMMUNITY): Payer: Medicare HMO

## 2018-10-15 DIAGNOSIS — E860 Dehydration: Secondary | ICD-10-CM

## 2018-10-15 LAB — BASIC METABOLIC PANEL WITH GFR
Anion gap: 6 (ref 5–15)
BUN: 19 mg/dL (ref 8–23)
CO2: 20 mmol/L — ABNORMAL LOW (ref 22–32)
Calcium: 9.1 mg/dL (ref 8.9–10.3)
Chloride: 116 mmol/L — ABNORMAL HIGH (ref 98–111)
Creatinine, Ser: 0.72 mg/dL (ref 0.44–1.00)
GFR calc Af Amer: >60 mL/min (ref >60)
GFR calc non Af Amer: >60 mL/min (ref >60)
Glucose, Bld: 157 mg/dL — ABNORMAL HIGH (ref 70–99)
Potassium: 3.9 mmol/L (ref 3.5–5.1)
Sodium: 142 mmol/L (ref 135–145)

## 2018-10-15 LAB — GLUCOSE, CAPILLARY
Glucose-Capillary: 109 mg/dL — ABNORMAL HIGH (ref 70–99)
Glucose-Capillary: 204 mg/dL — ABNORMAL HIGH (ref 70–99)
Glucose-Capillary: 148 mg/dL — ABNORMAL HIGH (ref 70–99)
Glucose-Capillary: 79 mg/dL (ref 70–99)

## 2018-10-15 LAB — CBC
HCT: 32.4 % — ABNORMAL LOW (ref 36.0–46.0)
Hemoglobin: 11 g/dL — ABNORMAL LOW (ref 12.0–15.0)
MCH: 29.6 pg (ref 26.0–34.0)
MCHC: 34 g/dL (ref 30.0–36.0)
MCV: 87.1 fL (ref 80.0–100.0)
Platelets: 419 10*3/uL — ABNORMAL HIGH (ref 150–400)
RBC: 3.72 MIL/uL — ABNORMAL LOW (ref 3.87–5.11)
RDW: 15.8 % — ABNORMAL HIGH (ref 11.5–15.5)
WBC: 11 10*3/uL — ABNORMAL HIGH (ref 4.0–10.5)
nRBC: 0.3 % — ABNORMAL HIGH (ref 0.0–0.2)

## 2018-10-15 MED ORDER — FREE WATER
200.0000 mL | Freq: Four times a day (QID) | Status: DC
  Administered 2018-10-15 – 2018-10-16 (×4): 200 mL

## 2018-10-15 NOTE — Progress Notes (Signed)
Daily Progress Note   Patient Name: Jordan Reed       Date: 10/15/2018 DOB: Sep 13, 1950  Age: 68 y.o. MRN#: [id number] Attending Physician: Antonieta Pert, MD Primary Care Physician: Patient, No Pcp Per Admit Date: 10/02/2018   Reason for Consultation/Follow-up: Establishing goals of care  Subjective: Patient will intermittently open eyes to voice and mumbles but will not consistently follow commands. She does not appear to be in distress or discomfort.   GOC:  Video skype with daughter Vaughan Browner). Patient intermittently will open eyes with encouragement but does not show meaningful interaction to daughter via skype.   Further discussed plan of care including diagnoses, interventions, and prognosis with [name]. She is tearful but does seem accepting of decision to not pursue prolonged, heroic interventions such as PEG tube placement. The patient's parents died of Alzheimer's and the patient has spoken in the past of her wishes against life-prolonging interventions. [name] would like to allow 24 more hours for a chance of improvement but if not improvement, she states "the decision has been made." She has been in close contact with her sister [name]) and daughter [name]) regarding [place].     Answered questions and concerns. Emotional support provided. [name] and I will attempted to skype with her mother again tomorrow.    Length of Stay: 13  Current Medications: Scheduled Meds:  . apixaban  5 mg Oral BID  . chlorhexidine gluconate (MEDLINE KIT)  15 mL Mouth Rinse BID  . diltiazem  60 mg Per Tube Q6H  . donepezil  10 mg Oral QHS  . feeding supplement (JEVITY 1.2 CAL)  237 mL Per Tube 5 X Daily  . feeding supplement (PRO-STAT SUGAR FREE 64)  30 mL Per Tube TID  . free water  200 mL Per Tube Q6H  . Gerhardt's  butt cream   Topical BID  . insulin aspart  0-9 Units Subcutaneous TID WC  . insulin aspart  3 Units Subcutaneous 5 X Daily  . lactobacillus acidophilus  1 tablet Per Tube BID  . mouth rinse  15 mL Mouth Rinse BID  . memantine  5 mg Oral BID  . metoprolol tartrate  50 mg Per Tube BID  . multivitamin  15 mL Oral Daily  . potassium chloride  20 mEq Per Tube Daily  . sodium chloride flush  3 mL Intravenous Once    Continuous Infusions: . cefTAZidime (FORTAZ)  IV 2 g (10/15/18 1024)  . metronidazole 500 mg (10/15/18 0951)    PRN Meds: acetaminophen (TYLENOL) oral liquid 160 mg/5 mL, metoprolol tartrate  Vital Signs: BP (!) 106/58 (BP Location: Left Arm)   Pulse 86   Temp 100.3 F (37.9 C) (Axillary)   Resp 20   Ht _0  (1.651 m)   Wt 80.6 kg   SpO2 99%   BMI 29.57 kg/m  SpO2: SpO2: 99 % O2 Device: O2 Device: Nasal Cannula O2 Flow Rate: O2 Flow Rate (L/min): 2 L/min  Intake/output summary:   Intake/Output Summary (Last 24 hours) at 10/15/2018 1241 Last data filed at 10/15/2018 0841 Gross per 24 hour  Intake 1067 ml  Output 2300 ml  Net -1233 ml   LBM: Last BM Date: (flexiseal )  Baseline Weight: Weight: 77.1 kg Most recent weight: Weight: 80.6 kg  Physical exam:  Lethargic, wakes to voice but falls back to sleep. Does not follow commands or show meaningful interaction. Pulmonary: Lungs rhonchi bilateral. No tachypnea, accessory muscle use, or respiratory distress. Cardiovascular: Tachycardia Abdominal: Abdomen soft, non-tender       Palliative Assessment/Data: PPS 20%    Flowsheet Rows     Most Recent Value  Intake Tab  Referral Department  Hospitalist  Unit at Time of Referral  Med/Surg Unit  Palliative Care Primary Diagnosis  Cardiac  Date Notified  10/09/18  Palliative Care Type  New Palliative care  Reason for referral  Clarify Goals of Care  Date of Admission  10/02/18  Date first seen by Palliative Care  10/09/18  # of days Palliative referral  response time  0 Day(s)  # of days IP prior to Palliative referral  7  Clinical Assessment  Psychosocial & Spiritual Assessment  Palliative Care Outcomes      Patient Active Problem List   Diagnosis Date Noted  . Palliative care by specialist   . Atrial fibrillation with RVR ([place])   . Goals of care, counseling/discussion   . Early onset Alzheimer's dementia without behavioral disturbance ([place])   . Palliative care encounter   . Pressure injury of skin 10/04/2018  . AKI (acute kidney injury) ([place])   . Acute encephalopathy   . Acute respiratory failure ([place])   . Altered mental status   . Rhabdomyolysis 10/02/2018    Palliative Care Assessment & Plan   HPI: 68 y.o. female  with past medical history of dementia/Alzheimer's, diabetes, HTN, hyperthyroid, anemia, former long term smoker admitted on 10/02/2018 after found slumped on porch and EMS (significant other is caregiver and had been hospitalized x 4 days at this time reportedly) called she was found to be in SVT and cardioverted. Required intubation (extubated 10/04/18) and with AKI with rhabdomyolysis and severe dehydration. Has had atrial fibrillation with RVR and continues with fever and concern for aspiration. Continues to fail swallow study and high risk aspiration.     Assessment: Acute respiratory failure with hypoxia Aspiration pneumonia AKI PAF Dementia Acute encephalopathy  Recommendations/Plan:  Continue DNR  Continue current plan of care and medical interventions.   F/u with attending and daughter regarding [place]. Will allow 24 more hours for improvement. If no improvement in condition, likely transition to comfort measures and hospice referral. Daughter confirms her mother's previous wishes against life-prolonging interventions including PEG tube.   If cortrak becomes clogged again or dislodged, daughter does NOT wish for cortrak to be replaced.    Code Status:  DNR   Prognosis:   Poor prognosis   Discharge Planning:  To Be Determined - SNF vs residential hospice  Care plan was discussed with RN, daughter [name], and Dr. Lupita Leash   Thank you for allowing the Palliative Medicine Team to assist in the care of this patient.   Total Time 45 Prolonged Time Billed  no    The above conversation was completed via telephone due to the visitor restrictions during the COVID-19 pandemic. Thorough chart review and discussion with necessary members of the care team was completed as part of assessment. All issues were discussed and addressed but no physical exam was performed.     Greater than 50%  of this time was spent counseling and coordinating care related to the above assessment and plan.  Ihor Dow, DNP, FNP-C Palliative Medicine Team  Phone: [phone] Fax: [phone]

## 2018-10-15 NOTE — Progress Notes (Signed)
PT came to see her, flexiseal came out. Placed back on, 45 ml water  Placed on the balloon tolerated well.

## 2018-10-15 NOTE — Plan of Care (Signed)
  Problem: Health Behavior/Discharge Planning: Goal: Ability to manage health-related needs will improve Outcome: Not Progressing   Problem: Clinical Measurements: Goal: Ability to maintain clinical measurements within normal limits will improve Outcome: Progressing Goal: Will remain free from infection Outcome: Progressing Goal: Diagnostic test results will improve Outcome: Progressing Goal: Respiratory complications will improve Outcome: Progressing   Problem: Nutrition: Goal: Adequate nutrition will be maintained Outcome: Progressing   Problem: Elimination: Goal: Will not experience complications related to bowel motility Outcome: Progressing

## 2018-10-15 NOTE — Progress Notes (Signed)
SLP Cancellation Note  Patient Details Name: Jordan Reed MRN: [id number] DOB: Aug 27, 1950   Cancelled treatment:       Reason Eval/Treat Not Completed: Patient's level of consciousness. Patient unable to be aroused for PO trials.   [name] [name] 10/15/2018, 3:55 PM   Angela Nevin, MA, CCC-SLP Speech Therapy [facility] Acute Rehab Pager: [phone]

## 2018-10-15 NOTE — Progress Notes (Signed)
PROGRESS NOTE    Jordan Reed  VQM:[id number] DOB: 03/09/1951 DOA: 10/02/2018 PCP: Patient, No Pcp Per   Brief Narrative: Per HPI: 68 year old female with advanced dementia, former long-term smoker, diabetes, hypothyroidismshe has no medical records with [place] or in care everywhere.she lives with her boyfriend who is also a caregiver who was unfortunately hospitalized 4 days prior to her admission, she was found slumped down in her front porch by neighbors, subsequently EMS was alerted she was found to be somnolent, noted to be in SVT, cardioverted  -In ED she remained tachycardic, narrow complex subsequently became agitated in the emergency room required sedation and subsequently intubation for airway protection, also had low grade temp of 100. -Laboratory evaluation consistent with dehydration rhabdomyolysis and therefore IV fluid resuscitation was given. Patient admitted to ICU for hypoxemic respiratory failure as well as metabolic encephalopathy related to AKI with rhabdomyolysis. She is also noted to have atrial fibrillation with RVR for which she was started on heparin drip as well as amiodarone.  Patient has since converted to sinus tachycardia and amiodarone has been discontinued.    On admission to ICU, noted to have fever overnight on 4/16 for which she was initially given vancomycin as well as Zosyn.  Vancomycin has been discontinued and patient remains on Zosyn with sputum cultures demonstrating gram-negative rods.  She continues to have persistent encephalopathy with some facial droop, MRI negative for CVA.  -4/17: extubated on 4/17 -4/18: transferred from PCCM to [facility] service -4/19 developed diffuse erythematous rash, likley drug rash, Zosyn was discontinued, she was started on levofloxacin instead -4/20: worsening fever to 101, CXR with bibasilar opacities concerning for Asp PNA -4/21: abx adjusted to ceftazidime and Flagyl. Ct ABD lower chest with small bilateral pleural effusion and  associated atelectasis or consolidation, no acute abdominal findings Patient remains confused, altered unable to take orally has been failing speech eval, has NG tube in place. Palliative care is following closely. 4/24:Cortrak ng for feeding. Remains confused no progressing, not abel to take po.  Subjective: Seen/examined Sleeping- woke up on calling, mumbles, difficult to comprehend.  Does not follow commands or interact. Moves all extremities in response to pain. On 2 L nasal cannula, overall heart rate and blood pressure stable, in NSR  Assessment & Plan:  Acute respiratory failure with hypoxia.  Initially intubated for airway protection, extubated 4/17 and with aspiration pneumonia:CT abdomen 4/21 shows lower chest with small bilateral pleural effusion with associated consolidation/atelectasis. On 2l [place], stable. continue pulmonary toileting, aspiration precaution.    Aspiration pneumonia/fever: was admitted to ICU with low-grade fever initially along with agitation SVT.Started on IV Zosyn in the ICU, sputum cultures grew stenotrophomonas maltophilia of questionable significance, had severe drug rash and Zosyn was discontinued 4/21 and started on ceftazidime/Flagyl since 4/21. Repeat x-ray 4/20 shows bibasilar opacities concerning for aspiration.CT 4/21 w lower chest showing Small bilateral pleural effusion with associated atelectasis/consolidation. Remains on ceftazidime/Flagyl and remains afebrile since 4/21.  wbc stable today. Repeat CXR in am. Recent Labs  Lab 10/10/18 0234 10/11/18 0324 10/12/18 0236 10/13/18 0214 10/14/18 0212  WBC 11.4* 9.2 14.4* 14.1* 10.3   Drug rash: improved.   AKI: resolved. Cont free water via ngt Recent Labs  Lab 10/11/18 0324 10/12/18 0236 10/13/18 0214 10/14/18 0212 10/15/18 0712  CREATININE 1.10* 0.97 0.85 0.77 0.72   Hypokalemia we will continue on KCl replacement daily via NG tube  Urine retention : had purewick and on bladder scan:  retaining urine >600 ml- foley placed  4/22. Remove once clinically better.  Diarrhea- having loose stool, c/w IVF, supportive care. No more fever. Some lecucotysis  Present, she is on tube feed. Unable to use probiotics via NGT  PAF: in and out of a fib. HR is is controlled now in 90s. Cont Cardizem 60 mg q6h and metoprolol 50 mg bid. TTE with preserved EF, hyperdynamic LV.Cont Eliquis.Noted to have suppressed TSH on admission, holding Synthroid and check TSH in 2 weeks.  Dementia with acute encephalopathy likely metabolic: more alert,awake, but confused w dementia, not following commands.Per daughter has moderate to severe dementia at baseline on Aricept and Namenda at home and confused part of the day, able to recognize  immediate family members only. Continue supportive care.  Hypothyroidism history on Synthroid but TSH was less than 0.01.  Holding Synthroid check TSH in 2 weeks.  T2 Diabetes mellitus with hyperglycemia : Controlled.  Hemoglobin C 5.7.  Stable.  Continue sliding scale insulin. Recent Labs  Lab 10/11/18 1607 10/11/18 2309 10/12/18 0415 10/12/18 0630 10/12/18 1001  GLUCAP 184* 136* 115* 174* 164*   Long-term smoker/suspected COPD.  Continue supportive care.  DVT prophylaxis: eliquis Code Status: DNR.  Palliative care on board, for goals of care Family Communication: Discussed with the palliative care, who has spoken with patient's daughter who understands that patient may not improve, if she does improve plan is for SNF but if not may need to look into hospice, daughter has verbalized understanding. 4/2-called daughter and updated her, she understands that patient is not getting well quickly, and is at risk for decompensation. Discussed with the palliative care,who skyped daughter with patient- daughter wants  to see if she has any improvement in next 24 hours and if not then requesting transition to hospice.  Disposition Plan: remains inpatient pending clinical  improvement.  Consultants:  PCCM Palliative care medicine  Procedures:  CT ABD/Pelvis 10/08/18 Lower chest: Small bilateral pleural effusions and associated atelectasis or consolidation, new compared to prior examination, No definite acute CT finding of the abdomen or pelvis to explain abdominal pain. 2. Distended urinary bladder with mild bilateral hydronephrosis. Correlate for urinary obstruction. 3.  Other chronic and incidental findings as detailed above.  CXR 4/28 Development of moderate hazy airspace attenuation the perihilar regions bilaterally likely interstitial edema, although infection is possible. Suggestion of small layering bilateral pleural effusions/atelectasis.  Enteric tube courses into the region of the stomach as tip is not visualized. Antimicrobials:  Vancomycin 4/17-4/17  Zosyn 4/17->4/21  Levaquin: 4/19>4/21  Ceftazidime/Flagyl: 4/21.>  Anti-infectives (From admission, onward)   Start     Dose/Rate Route Frequency Ordered Stop   10/11/18 1000  cefTAZidime (FORTAZ) 2 g in sodium chloride 0.9 % 100 mL IVPB     2 g 200 mL/hr over 30 Minutes Intravenous Every 8 hours 10/11/18 0916     10/10/18 0200  cefTAZidime (FORTAZ) 2 g in sodium chloride 0.9 % 100 mL IVPB  Status:  Discontinued     2 g 200 mL/hr over 30 Minutes Intravenous Every 12 hours 10/09/18 2122 10/11/18 0916   10/08/18 1200  cefTAZidime (FORTAZ) 2 g in sodium chloride 0.9 % 100 mL IVPB  Status:  Discontinued     2 g 200 mL/hr over 30 Minutes Intravenous Every 8 hours 10/08/18 1045 10/09/18 2122   10/08/18 1000  metroNIDAZOLE (FLAGYL) IVPB 500 mg     500 mg 100 mL/hr over 60 Minutes Intravenous Every 8 hours 10/08/18 0914     10/06/18 2200  levofloxacin (LEVAQUIN) IVPB  750 mg  Status:  Discontinued     750 mg 100 mL/hr over 90 Minutes Intravenous Every 24 hours 10/06/18 2111 10/08/18 0906   10/06/18 0400  vancomycin (VANCOCIN) 1,250 mg in sodium chloride 0.9 % 250 mL IVPB  Status:   Discontinued     1,250 mg 166.7 mL/hr over 90 Minutes Intravenous Every 48 hours 10/04/18 0336 10/04/18 1036   10/04/18 1200  piperacillin-tazobactam (ZOSYN) IVPB 3.375 g  Status:  Discontinued     3.375 g 12.5 mL/hr over 240 Minutes Intravenous Every 8 hours 10/04/18 0332 10/06/18 2111   10/04/18 0345  piperacillin-tazobactam (ZOSYN) IVPB 3.375 g     3.375 g 100 mL/hr over 30 Minutes Intravenous  Once 10/04/18 0332 10/04/18 0421   10/04/18 0345  vancomycin (VANCOCIN) 1,250 mg in sodium chloride 0.9 % 250 mL IVPB     1,250 mg 166.7 mL/hr over 90 Minutes Intravenous  Once 10/04/18 0332 10/04/18 0607   10/02/18 1100  cefTRIAXone (ROCEPHIN) 2 g in sodium chloride 0.9 % 100 mL IVPB     2 g 200 mL/hr over 30 Minutes Intravenous  Once 10/02/18 1051 10/02/18 1144       Objective: Vitals:   10/15/18 0249 10/15/18 0500 10/15/18 0600 10/15/18 0839  BP: 96/78   (!) 109/48  Pulse: 90  (!) 104 88  Resp: (!) 21  (!) 22 20  Temp: 99.3 F (37.4 C)   100.1 F (37.8 C)  TempSrc: Axillary   Axillary  SpO2: 97%  96% 97%  Weight:  80.6 kg    Height:        Intake/Output Summary (Last 24 hours) at 10/15/2018 0905 Last data filed at 10/15/2018 0841 Gross per 24 hour  Intake 1601 ml  Output 2300 ml  Net -699 ml   Filed Weights   10/13/18 0345 10/14/18 0327 10/15/18 0500  Weight: 80.1 kg 83.6 kg 80.6 kg   Weight change: -3 kg  Body mass index is 29.57 kg/m.  Intake/Output from previous day: 04/27 0701 - 04/28 0700 In: 1601 [P.O.:237; NG/GT:1304] Out: 1750 [Urine:1450; Stool:300] Intake/Output this shift: Total I/O In: -  Out: 1000 [Urine:1000]  Examination: General exam: Sleeping, woke up on tactile stimuli, remains confused not following commands HEENT:Oral mucosa moist, Ear/Nose WNL grossly, dentition normal. NGT+ Respiratory system: Bilateral diminished breath sounds at the base, mild crackles  Cardiovascular system: regular rate and rhythm, S1 & S2 heard, No  JVD/murmurs. Gastrointestinal system: Abdomen soft, non-tender, non-distended, BS +. No hepatosplenomegaly palpable. Nervous System:Alert, awake and oriented at baseline. Able to move UE and LE, sensation intact. Extremities: ue +,mild  Leg edema +, distal peripheral pulses palpable.  Skin: Erythematous rash in the body/extremities much improved  MSK: Normal muscle bulk,tone, power Wound as above.  Medications:  Scheduled Meds:  apixaban  5 mg Oral BID   chlorhexidine gluconate (MEDLINE KIT)  15 mL Mouth Rinse BID   diltiazem  60 mg Per Tube Q6H   donepezil  10 mg Oral QHS   feeding supplement (JEVITY 1.2 CAL)  237 mL Per Tube 5 X Daily   feeding supplement (PRO-STAT SUGAR FREE 64)  30 mL Per Tube TID   free water  200 mL Per Tube Q4H   Gerhardt's butt cream   Topical BID   insulin aspart  0-9 Units Subcutaneous TID WC   insulin aspart  3 Units Subcutaneous 5 X Daily   lactobacillus acidophilus  1 tablet Per Tube BID   mouth rinse  15 mL  Mouth Rinse BID   memantine  5 mg Oral BID   metoprolol tartrate  50 mg Per Tube BID   multivitamin  15 mL Oral Daily   potassium chloride  20 mEq Per Tube Daily   sodium chloride flush  3 mL Intravenous Once   Continuous Infusions:  cefTAZidime (FORTAZ)  IV 2 g (10/15/18 0229)   metronidazole 500 mg (10/15/18 0303)    Data Reviewed: I have personally reviewed following labs and imaging studies  CBC: Recent Labs  Lab 10/10/18 0234 10/11/18 0324 10/12/18 0236 10/13/18 0214 10/14/18 0212  WBC 11.4* 9.2 14.4* 14.1* 10.3  HGB 11.3* 11.6* 10.7* 10.4* 9.8*  HCT 34.6* 34.9* 32.4* 32.0* 29.9*  MCV 88.3 87.9 87.8 87.9 86.9  PLT 214 225 278 310 585   Basic Metabolic Panel: Recent Labs  Lab 10/09/18 0216  10/11/18 0324 10/12/18 0236 10/13/18 0214 10/14/18 0212 10/15/18 0712  NA 141   < > 143 144 146* 142 142  K 4.2   < > 3.5 3.5 3.4* 3.4* 3.9  CL 114*   < > 113* 117* 119* 116* 116*  CO2 19*   < > 21* 19* 21* 21*  20*  GLUCOSE 250*   < > 186* 146* 140* 157* 157*  BUN 44*   < > 48* 43* 32* 30* 19  CREATININE 1.39*   < > 1.10* 0.97 0.85 0.77 0.72  CALCIUM 9.7   < > 9.8 9.5 9.5 9.0 9.1  MG 2.1  --   --   --   --   --   --    < > = values in this interval not displayed.   GFR: Estimated Creatinine Clearance: 71.5 mL/min (by C-G formula based on SCr of 0.72 mg/dL). Liver Function Tests: Recent Labs  Lab 10/09/18 0216  AST 25  ALT 33  ALKPHOS 70  BILITOT 0.4  PROT 4.6*  ALBUMIN 1.7*   No results for input(s): LIPASE, AMYLASE in the last 168 hours. No results for input(s): AMMONIA in the last 168 hours. Coagulation Profile: No results for input(s): INR, PROTIME in the last 168 hours. Cardiac Enzymes: No results for input(s): CKTOTAL, CKMB, CKMBINDEX, TROPONINI in the last 168 hours. BNP (last 3 results) No results for input(s): PROBNP in the last 8760 hours. HbA1C: No results for input(s): HGBA1C in the last 72 hours. CBG: Recent Labs  Lab 10/14/18 0610 10/14/18 1113 10/14/18 1636 10/14/18 2107 10/15/18 0601  GLUCAP 122* 116* 92 86 109*   Lipid Profile: No results for input(s): CHOL, HDL, LDLCALC, TRIG, CHOLHDL, LDLDIRECT in the last 72 hours. Thyroid Function Tests: No results for input(s): TSH, T4TOTAL, FREET4, T3FREE, THYROIDAB in the last 72 hours. Anemia Panel: No results for input(s): VITAMINB12, FOLATE, FERRITIN, TIBC, IRON, RETICCTPCT in the last 72 hours. Sepsis Labs: Recent Labs  Lab 10/08/18 1040  PROCALCITON <0.10    Recent Results (from the past 240 hour(s))  Culture, blood (routine x 2)     Status: None   Collection Time: 10/07/18 11:50 AM  Result Value Ref Range Status   Specimen Description BLOOD LEFT HAND  Final   Special Requests   Final    BOTTLES DRAWN AEROBIC ONLY Blood Culture adequate volume   Culture   Final    NO GROWTH 5 DAYS Performed at [facility], [address] [place], [place] [address]    Report Status 10/12/2018 FINAL  Final   Culture, blood (routine x 2)     Status:  None   Collection Time: 10/07/18 11:55 AM  Result Value Ref Range Status   Specimen Description BLOOD LEFT HAND  Final   Special Requests   Final    BOTTLES DRAWN AEROBIC ONLY Blood Culture results may not be optimal due to an inadequate volume of blood received in culture bottles   Culture   Final    NO GROWTH 5 DAYS Performed at [facility], [place] [address], [place], [place] [address]    Report Status 10/12/2018 FINAL  Final      Radiology Studies: Dg Chest Port 1 View  Result Date: 10/15/2018 CLINICAL DATA:  Shortness of breath and weakness. Evaluate for pneumonia. EXAM: PORTABLE CHEST 1 VIEW COMPARISON:  10/07/2018 FINDINGS: Enteric tube courses into the region of the stomach and off the film as tip is not visualized. Patient is rotated to the right. Lungs are adequately inflated demonstrate interval development of hazy airspace opacification over the perihilar regions bilaterally suggesting interstitial edema and less likely infection. Increased density to the right hilar region likely due in part to differences rotation. Mild hazy bibasilar density likely small layering effusions/atelectasis. Cardiomediastinal silhouette and remainder of the exam is unchanged. IMPRESSION: Development of moderate hazy airspace attenuation the perihilar regions bilaterally likely interstitial edema, although infection is possible. Suggestion of small layering bilateral pleural effusions/atelectasis. Enteric tube courses into the region of the stomach as tip is not visualized. Electronically Signed   By: Marin Olp M.D.   On: 10/15/2018 08:28     LOS: 13 days   Time spent: More than 50% of that time was spent in counseling and/or coordination of care.  Antonieta Pert, MD [facility]  10/15/2018, 9:05 AM

## 2018-10-15 NOTE — Progress Notes (Signed)
Physical Therapy Treatment Patient Details Name: Jordan Reed MRN: [id number] DOB: 05-24-1951 Today's Date: 10/15/2018    History of Present Illness 68 y.o.femalewith past medical history of dementia/Alzheimer's, diabetes, HTN, hyperthyroid, anemia, former long term smokeradmitted on 4/15/2020after found slumped on porch and EMS (significant other is caregiver and had been hospitalized x 4 days at this time reportedly) called she was found to be in SVT and cardioverted.Required intubation (extubated 10/04/18) and with AKI with rhabdomyolysis and severe dehydration. Has had atrial fibrillation with RVR and continues with fever and concern for aspiration.    PT Comments    Pt continues to be delirious, talking incoherently most of the time.  Treatment limited to bed activity due to the time it takes for cleanup.   Follow Up Recommendations  SNF;Supervision/Assistance - 24 hour     Equipment Recommendations       Recommendations for Other Services       Precautions / Restrictions Precautions Precautions: Fall    Mobility  Bed Mobility     Rolling: Total assist;+2 for physical assistance;+2 for safety/equipment         General bed mobility comments: rolling for peri care where flexiseal has come out.  Multiple rolls for clean up and remaking the bed.  Transfers                    Ambulation/Gait                 Stairs             Wheelchair Mobility    Modified Rankin (Stroke Patients Only)       Balance     Sitting balance-Leahy Scale: Zero                                      Cognition Arousal/Alertness: Lethargic Behavior During Therapy: Flat affect Overall Cognitive Status: History of cognitive impairments - at baseline(but delerium on top of cognitive deficits)                   Orientation Level: Disoriented to;Place;Time;Situation   Memory: Decreased recall of precautions;Decreased short-term  memory   Safety/Judgement: Decreased awareness of safety;Decreased awareness of deficits Awareness: Intellectual          Exercises Other Exercises Other Exercises: ROM to all 4 extremities before,during and after pericare session for runny stool.    General Comments        Pertinent Vitals/Pain Pain Assessment: Faces Faces Pain Scale: Hurts a little bit Pain Location: pt is capable of expressing pain to pulled lines Pain Descriptors / Indicators: Grimacing    Home Living                      Prior Function            PT Goals (current goals can now be found in the care plan section) Acute Rehab PT Goals Patient Stated Goal: unable to state PT Goal Formulation: Patient unable to participate in goal setting Time For Goal Achievement: 10/19/18 Potential to Achieve Goals: Fair Progress towards PT goals: Not progressing toward goals - comment(still delerious and unable to focus on task.)    Frequency    Min 2X/week      PT Plan Current plan remains appropriate    Co-evaluation  AM-PAC PT "6 Clicks" Mobility   Outcome Measure  Help needed turning from your back to your side while in a flat bed without using bedrails?: Total Help needed moving from lying on your back to sitting on the side of a flat bed without using bedrails?: Total Help needed moving to and from a bed to a chair (including a wheelchair)?: Total Help needed standing up from a chair using your arms (e.g., wheelchair or bedside chair)?: Total Help needed to walk in hospital room?: Total Help needed climbing 3-5 steps with a railing? : Total 6 Click Score: 6    End of Session     Patient left: in bed;with call bell/phone within reach;Other (comment)   PT Visit Diagnosis: Other abnormalities of gait and mobility (R26.89);Muscle weakness (generalized) (M62.81);Difficulty in walking, not elsewhere classified (R26.2)     Time: [id number] PT Time Calculation (min)  (ACUTE ONLY): 31 min 10/15/2018    Charges:  $Therapeutic Exercise: 8-22 mins $Therapeutic Activity: 8-22 mins                     10/15/2018  [place] BingKen Zaryia Markel, PT Acute Rehabilitation Services [phone]  (pager) [phone]  (office)   Eliseo GumKenneth V Tyniesha Howald 10/15/2018, 5:19 PM

## 2018-10-16 DIAGNOSIS — J9621 Acute and chronic respiratory failure with hypoxia: Secondary | ICD-10-CM

## 2018-10-16 LAB — BASIC METABOLIC PANEL
Anion gap: 6 (ref 5–15)
BUN: 18 mg/dL (ref 8–23)
CO2: 23 mmol/L (ref 22–32)
Calcium: 9.3 mg/dL (ref 8.9–10.3)
Chloride: 112 mmol/L — ABNORMAL HIGH (ref 98–111)
Creatinine, Ser: 0.63 mg/dL (ref 0.44–1.00)
GFR calc Af Amer: >60 mL/min (ref >60)
GFR calc non Af Amer: >60 mL/min (ref >60)
Glucose, Bld: 88 mg/dL (ref 70–99)
Potassium: 4.1 mmol/L (ref 3.5–5.1)
Sodium: 141 mmol/L (ref 135–145)

## 2018-10-16 LAB — CBC
HCT: 34.8 % — ABNORMAL LOW (ref 36.0–46.0)
Hemoglobin: 11.4 g/dL — ABNORMAL LOW (ref 12.0–15.0)
MCH: 28.9 pg (ref 26.0–34.0)
MCHC: 32.8 g/dL (ref 30.0–36.0)
MCV: 88.1 fL (ref 80.0–100.0)
Platelets: 329 10*3/uL (ref 150–400)
RBC: 3.95 MIL/uL (ref 3.87–5.11)
RDW: 16.1 % — ABNORMAL HIGH (ref 11.5–15.5)
WBC: 9.5 10*3/uL (ref 4.0–10.5)
nRBC: 0 % (ref 0.0–0.2)

## 2018-10-16 LAB — GLUCOSE, CAPILLARY: Glucose-Capillary: 70 mg/dL (ref 70–99)

## 2018-10-16 MED ORDER — GLYCOPYRROLATE 0.2 MG/ML IJ SOLN: 0.2000 mg | INTRAMUSCULAR | Status: DC | PRN

## 2018-10-16 MED ORDER — MORPHINE SULFATE (PF) 2 MG/ML IV SOLN: 1.0000 mg | INTRAVENOUS | Status: DC | PRN

## 2018-10-16 MED ORDER — LORAZEPAM 2 MG/ML IJ SOLN: 0.5000 mg | Freq: Four times a day (QID) | INTRAMUSCULAR | Status: DC | PRN

## 2018-10-16 MED ORDER — DIPHENHYDRAMINE HCL 50 MG/ML IJ SOLN: 12.5000 mg | Freq: Four times a day (QID) | INTRAMUSCULAR | Status: DC | PRN

## 2018-10-16 NOTE — TOC Transition Note (Signed)
Transition of Care [facility]) - CM/SW Discharge Note   Patient Details  Name: Jordan Reed MRN: [id number] Date of Birth: 1950-10-27  [facility] CM/SW Contact:  Margarito Liner, LCSW Phone Number: 10/16/2018, 4:22 PM   Clinical Narrative: Sherron Monday to daughter Inetta Fermo. She said patient's boyfriend is not letting them pick up her belongings, bank statements, etc. She tried calling the police department for assistance but they told her to make an APS report. When she talked to APS they told her to have the hospital social worker make the report. CSW on hold with APS now. Will try again tomorrow if unable to get through today.  CSW facilitated patient discharge including contacting patient family and facility to confirm patient discharge plans. Clinical information faxed to facility and family agreeable with plan. CSW arranged ambulance transport via [facility] to [facility] at 5:30 pm. RN to call report prior to discharge [phone]).  CSW will sign off for now as social work intervention is no longer needed. Please consult [place] again if new needs arise.  Final next level of care: Hospice Medical Facility Barriers to Discharge: Barriers Resolved   Patient Goals and CMS Choice     Choice offered to / list presented to : Adult Children  Discharge Placement              Patient [facility] bed at: [facility]) Patient to be transferred to facility by: [facility] Name of family member notified: Theda Belfast Patient and family notified of of transfer: 10/16/18  Discharge Plan and Services                                     Social Determinants of Health (SDOH) Interventions     Readmission Risk Interventions No flowsheet data found.

## 2018-10-16 NOTE — Discharge Summary (Signed)
Discharge Summary  Jordan Reed:[id number] DOB: 10/23/1950  PCP: Jordan Reed, No Pcp Per  Admit date: 10/02/2018 Discharge date: 10/16/2018  Time spent: , more than 50% time spent on coordination of care.  Case discussed with palliative care, social worker and Charity fundraiser.  Recommendations for Outpatient Follow-up:  1. 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    Discharge Condition: stable  Diet recommendation: comfort feeds if able to tolerate   Filed Weights   10/14/18 0327 10/15/18 0500 10/16/18 0300  Weight: 83.6 kg 80.6 kg 82.5 kg    History of present illness: (per admitting MD Dr Mikey Bussing) 68 year old female with no known past medical history.  She has no medical records with [place] and I have queried [place], [place], and [facility] with no files found.  She apparently lives at home but has very active involvement of a caregiver who was unfortunately hospitalized 4 days ago.  Since that time the Jordan Reed has been by herself.  It is unclear what level of assistance she requires at baseline.  A car driving past her house noticed her to be slumped over on the front porch on 10/02/2018.  EMS was alerted and upon their arrival she had altered mental status.  They hooked her up to EKG and found her to be profoundly tachycardic with a narrow complex rhythm.  They felt as though her symptoms may be secondary to  symptomatic SVT.  She was cardioverted twice without effect.  In the emergency department she remained tachycardic.  She was somewhat cooperative was staff and able to give a limited history, however, she became increasingly agitated and required intubation for airway protection.  Laboratory evaluation consistent with dehydration rhabdomyolysis and therefore IV fluid resuscitation was given.  She received 3 L of crystalloid and then was started on infusion at 125 an hour.  PCCM was asked to admit.   On admission to ICU,noted to have fever overnight on 4/16 for which she was initially given vancomycin as well as Zosyn. Vancomycin has been discontinued and Jordan Reed remains on Zosyn with sputum cultures demonstrating gram-negative rods. She continues to have persistent encephalopathy with some facial droop, MRI negative for CVA.  -4/17: extubated on 4/17 -4/18: transferred from PCCM to [facility] service -4/19 developed diffuse erythematous rash, likley drug rash, Zosyn was discontinued, she was started on levofloxacin instead -4/20: worsening fever to 101, CXR with bibasilar opacities concerning for Asp PNA -4/21: abx adjusted to ceftazidime and Flagyl. Ct ABD lower chest with small bilateral pleural effusion and associated atelectasis or consolidation, no acute abdominal findings Jordan Reed remains confused, altered unable to take orally has been failing speech eval, has NG tube in place. Palliative care is following closely. 4/24:Cortrak ng for feeding. Remains confused no progressing, not abel to take po. Family decided to pursue comfort measures and residential hospice placement  Hospital Course:  Active Problems:  Rhabdomyolysis   Altered mental status   Pressure injury of skin   AKI (acute kidney injury) (HCC)   Acute encephalopathy   Acute respiratory failure (HCC)   Early onset Alzheimer's dementia without behavioral disturbance [facility])   Palliative care encounter   Goals of care,  counseling/discussion   Palliative care by specialist   Atrial fibrillation with RVR (HCC)   Dehydration   Acute respiratory failure with hypoxia.  Initially intubated for airway protection, extubated 4/17 and with aspiration pneumonia:CT abdomen 4/21 shows lower chest with small bilateral pleural effusion with associated consolidation/atelectasis. On 2l [place], stable.  Family decided to pursue comfort measures and residential hospice placement  Aspiration pneumonia/fever: was admitted to ICU with low-grade fever initially along with agitation SVT.Started on IV Zosyn in the ICU, sputum cultures grew stenotrophomonas maltophilia of questionable significance, had severe drug rash and Zosyn was discontinued 4/21 and started on ceftazidime/Flagyl since 4/21. Repeat x-ray4/20shows bibasilar opacities concerning for aspiration.CT 4/21 w lower chest showing Small bilateral pleural effusion with associated atelectasis/consolidation.  afebrile since 4/21.   Family decided to pursue comfort measures and residential hospice placement Drug rash: improved.   AKI: resolved.  LastLabs         Recent Labs  Lab 10/11/18 0324 10/12/18 0236 10/13/18 0214 10/14/18 0212 10/15/18 0712  CREATININE 1.10* 0.97 0.85 0.77 0.72     Hypokalemia replaced  via NG tube while in the hospital  Urine retention : had purewick and on bladder scan: retaining urine >600 ml- foley placed 4/22.  Diarrhea- having loose stool, possible from tube feeds, on flexiseal     PAF: in and out of a fib. HR is is controlled now in 90s.   Hypothyroidism history on Synthroid but TSH was less than 0.01.   T2 Diabetes mellitus with hyperglycemia :   Hemoglobin C 5.7.  Stable.     Long-term smoker/suspected COPD.  Dementia with acute encephalopathy likely metabolic:  Per daughter has moderate to severe dementia at baseline on Aricept and Namenda at home and confused part of the day, able to recognize  immediate family  members only.  Family decided to pursue comfort measures and residential hospice placement   Procedures:  Intubation/extubation  Enteric tube courses into the region of the stomach as tip is not Visualized.  Antimicrobials:  Vancomycin 4/17-4/17  Zosyn 4/17->4/21  Levaquin: 4/19>4/21  Ceftazidime/Flagyl: 4/21.>  Consultations:  Critical care  Palliative care  Discharge Exam: BP (!) 113/48 (BP Location: Left Arm)    Pulse 86    Temp (!) 97.2 F (36.2 C) (Axillary)    Resp 19    Ht  (1.651 m)    Wt 82.5 kg    SpO2 98%    BMI 30.27 kg/m   General: open eyes to voice, does not appear in pain Cardiovascular: RRR Respiratory: diminished at basis    Discharge Instructions    Diet general   Complete by:  As directed    Comfort feeds is able to tolerate   Increase activity slowly   Complete by:  As directed      Allergies as of 10/16/2018      Reactions   Codeine Hives, Itching   Penicillins Rash      Medication List    STOP taking these medications   atorvastatin 10 MG tablet Commonly known as:  LIPITOR   donepezil 10 MG tablet Commonly known as:  ARICEPT   DULoxetine 30 MG capsule Commonly known as:  CYMBALTA  lisinopril 5 MG tablet Commonly known as:  ZESTRIL   memantine 5 MG tablet Commonly known as:  NAMENDA   rOPINIRole 2 MG tablet Commonly known as:  REQUIP      Allergies  Allergen Reactions   Codeine Hives and Itching   Penicillins Rash      The results of significant diagnostics from this hospitalization (including imaging, microbiology, ancillary and laboratory) are listed below for reference.    Significant Diagnostic Studies: Ct Abdomen Pelvis Wo Contrast  Result Date: 10/02/2018 CLINICAL DATA:  Trauma to the chest and flank. The Jordan Reed was found down. EXAM: CT ABDOMEN AND PELVIS WITHOUT CONTRAST TECHNIQUE: Multidetector CT imaging of the abdomen and pelvis was performed following the standard protocol without IV  contrast. COMPARISON:  None. FINDINGS: Lower chest: Minimal atelectasis at the posterior aspect of the right lung base. Hepatobiliary: Attic parenchyma is normal. Full a cystectomy. The common bile duct is dilated to a diameter of 15 mm, above normal limits for a post cholecystectomy Jordan Reed. However, there is no visible mass or stone in the distal duct. Is the Jordan Reed's bilirubin elevated? Pancreas: Unremarkable. No pancreatic ductal dilatation or surrounding inflammatory changes. Spleen: No splenic injury or perisplenic hematoma. Adrenals/Urinary Tract: Adrenal glands are normal. Slight prominence of the right pelvis. Kidneys are otherwise normal. No hydronephrosis. Bladder is normal. Stomach/Bowel: Multiple surgical clips in the left upper quadrant adjacent to the fundus of the stomach. NG tube tip is in the fundus of the stomach. Bowel otherwise appears normal including the terminal ileum and appendix. Vascular/Lymphatic: Aortic atherosclerosis. No enlarged abdominal or pelvic lymph nodes. Reproductive: Uterus and bilateral adnexa are unremarkable. Other: Tiny periumbilical hernia containing only fat. The defect in the anterior abdominal wall is only 13 mm. No ascites. Musculoskeletal: No acute abnormality. 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 Chronic diffuse degenerative disc disease from L1-2 through L5-S1. IMPRESSION: 1. No acute abnormality. 2. Dilatation of the common bile duct 15 mm, above normal limits for a post cholecystectomy Jordan Reed. Is the Jordan Reed's bilirubin elevated? 3.  Aortic Atherosclerosis (ICD10-I70.0). Electronically Signed   By: Francene BoyersJames  Maxwell M.D.   On: 10/02/2018 12:35   Dg Abd 1 View  Result Date: 10/05/2018 CLINICAL DATA:  NG tube placement EXAM: ABDOMEN - 1 VIEW COMPARISON:  None. FINDINGS: Nasogastric tube with the tip projecting over the antrum of the stomach. There is no bowel dilatation to suggest obstruction. There is no evidence of  pneumoperitoneum, portal venous gas or pneumatosis. There are no pathologic calcifications along the expected course of the ureters. The osseous structures are unremarkable. IMPRESSION: Nasogastric tube with the tip projecting over the antrum of the stomach. Electronically Signed   By: Elige KoHetal  Patel   On: 10/05/2018 14:23   Ct Head Wo Contrast  Result Date: 10/02/2018 CLINICAL DATA:  Found down outside house. EXAM: CT HEAD WITHOUT CONTRAST TECHNIQUE: Contiguous axial images were obtained from the base of the skull through the vertex without intravenous contrast. COMPARISON:  CT head 03/02/2012. FINDINGS: Brain: There is no evidence of acute intracranial hemorrhage, mass lesion, brain edema or extra-axial fluid collection. There is progressive atrophy with prominence of the ventricles and subarachnoid spaces. There is mild patchy low-density in the periventricular white matter, likely due to chronic small vessel ischemic changes. There is no CT evidence of acute cortical infarction. Vascular: Diffuse intracranial vascular calcifications. No hyperdense vessel identified. Skull: Negative for fracture or focal lesion. Sinuses/Orbits: The visualized paranasal sinuses and mastoid air  cells are clear. No orbital abnormalities are seen. Other: Mucosal thickening in the nasal passages and fluid opacification of the nasopharynx attributed to endotracheal intubation. IMPRESSION: 1. No acute intracranial findings. 2. Progressive cerebral atrophy from 2013. Electronically Signed   By: Carey Bullocks M.D.   On: 10/02/2018 12:11   Ct Chest Wo Contrast  Result Date: 10/02/2018 CLINICAL DATA:  Jordan Reed was found down.  Right chest trauma. EXAM: CT CHEST WITHOUT CONTRAST TECHNIQUE: Multidetector CT imaging of the chest was performed following the standard protocol without IV contrast. COMPARISON:  Chest x-ray dated 10/02/2018 FINDINGS: Cardiovascular: Aortic atherosclerosis. Heart size is normal. No pericardial effusion.  Mediastinum/Nodes: Endotracheal tube tip is just above the carina and could be retracted 2-3 cm. NG tube tip is in the fundus of the stomach and could be advanced no adenopathy. Thyroid gland and trachea and esophagus appear normal. Lungs/Pleura: No infiltrates or effusions. Minimal atelectasis at the right lung base posteriorly. Upper Abdomen: No acute abnormality. Musculoskeletal: No acute abnormality. IMPRESSION: 1. No acute abnormality of the chest. 2. Endotracheal tube is at the level of the carina and could be retracted approximately 2-3 cm. 3. NG tube tip is in the fundus of the stomach and could be slightly advanced. 4. Critical Value/emergent results were called by telephone at the time of interpretation on 10/02/2018 at 12:27 pm to Dr. Kennis Carina , who verbally acknowledged these results. Electronically Signed   By: Francene Boyers M.D.   On: 10/02/2018 12:27   Mr Brain Wo Contrast  Result Date: 10/05/2018 CLINICAL DATA:  Encephalopathy. EXAM: MRI HEAD WITHOUT CONTRAST TECHNIQUE: Multiplanar, multiecho pulse sequences of the brain and surrounding structures were obtained without intravenous contrast. COMPARISON:  Head CT 10/02/2018 FINDINGS: Brain: There is no evidence of acute infarct, intracranial hemorrhage, mass, midline shift, or extra-axial fluid collection. Cerebral atrophy is moderately to severely advanced for age and greatest in the frontal lobes. Patchy T2 hyperintensities primarily affecting the white matter of the frontal lobes are nonspecific but compatible with mild chronic small vessel ischemic disease. Vascular: Major intracranial vascular flow voids are preserved. Skull and upper cervical spine: Unremarkable bone marrow signal. Sinuses/Orbits: Bilateral cataract extraction. Mild right sphenoid sinus mucosal thickening. Small right mastoid effusion. Other: None. IMPRESSION: 1. No acute intracranial abnormality. 2. Moderately to severely age advanced cerebral atrophy and mild chronic  small vessel ischemic disease. Electronically Signed   By: Sebastian Ache M.D.   On: 10/05/2018 12:39   Ct Abdomen Pelvis W Contrast  Result Date: 10/08/2018 CLINICAL DATA:  Epigastric tenderness EXAM: CT ABDOMEN AND PELVIS WITH CONTRAST TECHNIQUE: Multidetector CT imaging of the abdomen and pelvis was performed using the standard protocol following bolus administration of intravenous contrast. CONTRAST:  ISOVUE-300 IOPAMIDOL (ISOVUE-300) INJECTION 61% COMPARISON:  10/02/2018 FINDINGS: Lower chest: Small bilateral pleural effusions and associated atelectasis or consolidation, new compared to prior examination. Coronary artery calcifications. Hepatobiliary: No focal liver abnormality is seen. Status post cholecystectomy. Dilatation of the common bile duct to approximately 11 mm. Pancreas: Unremarkable. No pancreatic ductal dilatation or surrounding inflammatory changes. Spleen: Normal in size without focal abnormality. Adrenals/Urinary Tract: Adrenal glands are unremarkable. Mild bilateral hydronephrosis. Distended urinary bladder. Stomach/Bowel: Esophagogastric tube with tip and side port below the diaphragm. Extensive pleural material about the greater curvature of stomach. Appendix appears normal. No evidence of bowel wall thickening, distention, or inflammatory changes. Rectal tube. Vascular/Lymphatic: Mixed calcific atherosclerosis. No enlarged abdominal or pelvic lymph nodes. Reproductive: No mass or other abnormality. Other: Small, fat containing  epigastric hernia. No abdominopelvic ascites. Musculoskeletal: No acute or significant osseous findings. IMPRESSION: 1. No definite acute CT finding of the abdomen or pelvis to explain abdominal pain. 2. Distended urinary bladder with mild bilateral hydronephrosis. Correlate for urinary obstruction. 3.  Other chronic and incidental findings as detailed above. Electronically Signed   By: Lauralyn Primes M.D.   On: 10/08/2018 13:27   Dg Pelvis Portable  Result  Date: 10/02/2018 CLINICAL DATA:  Unwitnessed fall. EXAM: PORTABLE PELVIS 1-2 VIEWS COMPARISON:  None. FINDINGS: There is no evidence of pelvic fracture or diastasis. No pelvic bone lesions are seen. IMPRESSION: Negative. Electronically Signed   By: Lupita Raider M.D.   On: 10/02/2018 11:21   Dg Chest Port 1 View  Result Date: 10/15/2018 CLINICAL DATA:  Shortness of breath and weakness. Evaluate for pneumonia. EXAM: PORTABLE CHEST 1 VIEW COMPARISON:  10/07/2018 FINDINGS: Enteric tube courses into the region of the stomach and off the film as tip is not visualized. Jordan Reed is rotated to the right. Lungs are adequately inflated demonstrate interval development of hazy airspace opacification over the perihilar regions bilaterally suggesting interstitial edema and less likely infection. Increased density to the right hilar region likely due in part to differences rotation. Mild hazy bibasilar density likely small layering effusions/atelectasis. Cardiomediastinal silhouette and remainder of the exam is unchanged. IMPRESSION: Development of moderate hazy airspace attenuation the perihilar regions bilaterally likely interstitial edema, although infection is possible. Suggestion of small layering bilateral pleural effusions/atelectasis. Enteric tube courses into the region of the stomach as tip is not visualized. Electronically Signed   By: Elberta Fortis M.D.   On: 10/15/2018 08:28   Dg Chest Port 1 View  Result Date: 10/07/2018 CLINICAL DATA:  68 year old female with fever EXAM: PORTABLE CHEST 1 VIEW COMPARISON:  Prior chest x-ray 10/03/2018 FINDINGS: The Jordan Reed has been extubated. A gastric tube remains in place. The tip of the tube lies off the field of view, presumably within the stomach. New bibasilar airspace opacities which are veil like on the right, and more consolidated on the left with obscuration of the hemidiaphragm. Overall, the inspiratory volumes are lower resulting in crowding of the pulmonary  vasculature. However, there is no overt edema. No pneumothorax. No acute osseous abnormality. Surgical changes in the left upper quadrant, right upper quadrant and right shoulder. IMPRESSION: 1. Lower inspiratory volumes following extubation. 2. New bibasilar airspace opacities favored to reflect a combination of small layering pleural effusions and atelectasis. In the appropriate clinical setting, pneumonia or aspiration would be difficult to exclude radiographically. Electronically Signed   By: Malachy Moan M.D.   On: 10/07/2018 12:58   Dg Chest Port 1 View  Result Date: 10/03/2018 CLINICAL DATA:  Hypoxia EXAM: PORTABLE CHEST 1 VIEW COMPARISON:  Chest radiograph and chest CT October 02, 2018 FINDINGS: Endotracheal tube tip is 2.0 cm above the carina. Nasogastric tube tip is in the proximal stomach. The side port may be above the gastroesophageal junction. There is also an apparent second catheter/monitor with tip at the gastroesophageal junction. No pneumothorax. There is atelectatic change in the right mid lung. Lungs elsewhere are clear. Heart size and pulmonary vascular normal. No adenopathy. No bone lesions. IMPRESSION: Tube positions as described without pneumothorax. Nasogastric tube side port may be above the diaphragm; it may be prudent to advance nasogastric tube approximately 6-7 cm. There is atelectatic change in the right base. Lungs elsewhere clear. Heart size within normal limits. Electronically Signed   By: Bretta Bang III M.D.  On: 10/03/2018 08:28   Dg Chest Portable 1 View  Result Date: 10/02/2018 CLINICAL DATA:  Found down in front yard. Unresponsive. Endotracheal tube placement. EXAM: PORTABLE CHEST 1 VIEW COMPARISON:  None. FINDINGS: The heart size is normal. The Jordan Reed is intubated. Endotracheal tube terminates 2 cm from the carina. The Jordan Reed is slightly rotated to the left, exaggerating the mediastinum on the right. Biapical airspace opacities are present. Changes of  COPD are noted. The visualized soft tissues and bony thorax are unremarkable. IMPRESSION: 1. Endotracheal tube terminates 2 cm from the carina. 2. Biapical interstitial prominence is likely chronic. 3. No acute cardiopulmonary disease. Electronically Signed   By: Marin Roberts M.D.   On: 10/02/2018 11:21   Dg Abd Portable 1v  Result Date: 10/03/2018 CLINICAL DATA:  68 year old female with a history of gastric tube placement EXAM: PORTABLE ABDOMEN - 1 VIEW COMPARISON:  None. FINDINGS: Gastric tube terminates near the pylorus or potentially post pyloric, in the right abdomen. Surgical changes of the epigastric region/left upper quadrant, as well as cholecystectomy. Gas within small bowel and colon. No abnormal distention. Pelvic thermister in place. IMPRESSION: Gastric tube terminates within the stomach, at the pyloric region or potentially post pyloric. Electronically Signed   By: Gilmer Mor D.O.   On: 10/03/2018 13:15   Dg Femur Port, Min 2 Views Right  Result Date: 10/02/2018 CLINICAL DATA:  Found in yard unresponsive. Trauma. EXAM: RIGHT FEMUR PORTABLE 1 VIEW COMPARISON:  None. FINDINGS: Portable AP radiographs of the right femur are provided. No acute fracture is identified. Right knee arthroplasty is incompletely evaluated. Atherosclerotic vascular calcifications are noted. IMPRESSION: No acute osseous abnormality identified. Electronically Signed   By: Sebastian Ache M.D.   On: 10/02/2018 11:20    Microbiology: Recent Results (from the past 240 hour(s))  Culture, blood (routine x 2)     Status: None   Collection Time: 10/07/18 11:50 AM  Result Value Ref Range Status   Specimen Description BLOOD LEFT HAND  Final   Special Requests   Final    BOTTLES DRAWN AEROBIC ONLY Blood Culture adequate volume   Culture   Final    NO GROWTH 5 DAYS Performed at [facility], [address], [place], [place] [address]    Report Status 10/12/2018 FINAL  Final  Culture, blood (routine x 2)      Status: None   Collection Time: 10/07/18 11:55 AM  Result Value Ref Range Status   Specimen Description BLOOD LEFT HAND  Final   Special Requests   Final    BOTTLES DRAWN AEROBIC ONLY Blood Culture results may not be optimal due to an inadequate volume of blood received in culture bottles   Culture   Final    NO GROWTH 5 DAYS Performed at [facility], [address], [place], [place] [address]    Report Status 10/12/2018 FINAL  Final     Labs: Basic Metabolic Panel: Recent Labs  Lab 10/12/18 0236 10/13/18 0214 10/14/18 0212 10/15/18 0712 10/16/18 0219  NA 144 146* 142 142 141  K 3.5 3.4* 3.4* 3.9 4.1  CL 117* 119* 116* 116* 112*  CO2 19* 21* 21* 20* 23  GLUCOSE 146* 140* 157* 157* 88  BUN 43* 32* 30* 19 18  CREATININE 0.97 0.85 0.77 0.72 0.63  CALCIUM 9.5 9.5 9.0 9.1 9.3   Liver Function Tests: No results for input(s): AST, ALT, ALKPHOS, BILITOT, PROT, ALBUMIN in the last 168 hours. No results for input(s):  LIPASE, AMYLASE in the last 168 hours. No results for input(s): AMMONIA in the last 168 hours. CBC: Recent Labs  Lab 10/12/18 0236 10/13/18 0214 10/14/18 0212 10/15/18 0901 10/16/18 0219  WBC 14.4* 14.1* 10.3 11.0* 9.5  HGB 10.7* 10.4* 9.8* 11.0* 11.4*  HCT 32.4* 32.0* 29.9* 32.4* 34.8*  MCV 87.8 87.9 86.9 87.1 88.1  PLT 278 310 310 419* 329   Cardiac Enzymes: No results for input(s): CKTOTAL, CKMB, CKMBINDEX, TROPONINI in the last 168 hours. BNP: BNP (last 3 results) No results for input(s): BNP in the last 8760 hours.  ProBNP (last 3 results) No results for input(s): PROBNP in the last 8760 hours.  CBG: Recent Labs  Lab 10/15/18 0601 10/15/18 1124 10/15/18 1634 10/15/18 2119 10/16/18 0601  GLUCAP 109* 204* 148* 79 70       Signed:  Albertine Grates MD, PhD  [facility] 10/16/2018, 3:47 PM

## 2018-10-16 NOTE — Progress Notes (Signed)
Pharmacy Antibiotic Note  Jordan Reed is a 68 y.o. female admitted on 10/02/2018 with fever.  Pharmacy has been consulted to dose [facility] for Stenotrophomonas PNA.  She is also on Flagyl to cover for aspiration.  Antibiotics adjusted for possible drug fever on Levaquin and rash on Zosyn.  Rash improving per RN.  Renal function normalized, afebrile, repeat cultures negative.  Plan: Continue Fortaz 2gm IV Q8H Continue Flagyl 500mg  IV Q8H per MD Monitor renal fxn, clinical progress   Height: 5\' 5"  (165.1 cm) Weight: 181 lb 14.1 oz (82.5 kg) IBW/kg (Calculated) : 57  Temp (24hrs), Avg:98.9 F (37.2 C), Min:97.6 F (36.4 C), Max:100.3 F (37.9 C)  Recent Labs  Lab 10/12/18 0236 10/13/18 0214 10/14/18 0212 10/15/18 0712 10/15/18 0901 10/16/18 0219  WBC 14.4* 14.1* 10.3  --  11.0* 9.5  CREATININE 0.97 0.85 0.77 0.72  --  0.63    Estimated Creatinine Clearance: 72.4 mL/min (by C-G formula based on SCr of 0.63 mg/dL).    Allergies  Allergen Reactions  . Codeine Hives and Itching  . Penicillins Rash   Zosyn 4/17>> 4/19 Levaquin 4/19>>4/21 Vanc 4/17 x1 Fortaz 4/21>  MRSA PCR 4/15: neg Ucx 4/16: ng Bcx 4/15: GPR 1/4 bottles - corynebacterium  TA 4/17: mod steno - sen to LVQ Bcx 4/17: negative 4/20 BCx: negative   Fredonia Highland, PharmD, BCPS Clinical Pharmacist [phone] Please check AMION for all [facility] numbers 10/16/2018

## 2018-10-16 NOTE — Progress Notes (Signed)
Daily Progress Note   Patient Name: Jordan Reed       Date: 10/16/2018 DOB: 03/02/1951  Age: 68 y.o. MRN#: [id number] Attending Physician: Florencia Reasons, MD Primary Care Physician: Patient, No Pcp Per Admit Date: 10/02/2018   Reason for Consultation/Follow-up: Establishing goals of care  Subjective: Patient will intermittently open eyes to voice and mumbles but will not consistently follow commands. She does not appear to be in distress or discomfort.   GOC:  Video skype with both daughters, [name] and [name]. SLP also at bedside. Patient accepted bites of applesauce but remains drowsy with poor engagement in conversation with daughters.   Shortly after, f/u with [name] regarding plan of care. Discussed course of hospitalization including diagnoses, interventions, and poor prognosis.   [name] tearful but tells me family has made decision for shift to comfort and transfer to hospice facility. Educated on hospice philosophy and goal of comfort, quality, and dignity at EOL. Educated on prn medications for comfort and relief from suffering. Educated on comfort feeds. Explained that interventions not aimed at comfort will be discontinued including IVF, abx. [name] understands and agrees with plan of care and shift to comfort.  [place] requests [facility] facility. SW notified. Answered questions and concerns. Emotional support provided.   Length of Stay: 14  Current Medications: Scheduled Meds:  . apixaban  5 mg Oral BID  . chlorhexidine gluconate (MEDLINE KIT)  15 mL Mouth Rinse BID  . diltiazem  60 mg Per Tube Q6H  . donepezil  10 mg Oral QHS  . feeding supplement (JEVITY 1.2 CAL)  237 mL Per Tube 5 X Daily  . feeding supplement (PRO-STAT SUGAR FREE 64)  30 mL Per Tube TID  . free water  200 mL Per Tube Q6H   . Gerhardt's butt cream   Topical BID  . insulin aspart  0-9 Units Subcutaneous TID WC  . insulin aspart  3 Units Subcutaneous 5 X Daily  . lactobacillus acidophilus  1 tablet Per Tube BID  . mouth rinse  15 mL Mouth Rinse BID  . memantine  5 mg Oral BID  . metoprolol tartrate  50 mg Per Tube BID  . multivitamin  15 mL Oral Daily  . potassium chloride  20 mEq Per Tube Daily  . sodium chloride flush  3 mL Intravenous Once    Continuous Infusions: . cefTAZidime (FORTAZ)  IV Stopped (10/16/18 0347)  . metronidazole 500 mg (10/16/18 0917)    PRN Meds: acetaminophen (TYLENOL) oral liquid 160 mg/5 mL, metoprolol tartrate  Vital Signs: BP (!) 113/48 (BP Location: Left Arm)   Pulse 86   Temp 99.3 F (37.4 C) (Axillary)   Resp 19   Ht '5\' 5"'$  (1.651 m)   Wt 82.5 kg   SpO2 98%   BMI 30.27 kg/m  SpO2: SpO2: 98 % O2 Device: O2 Device: Nasal Cannula O2 Flow Rate: O2 Flow Rate (L/min): 2 L/min  Intake/output summary:   Intake/Output Summary (Last 24 hours) at 10/16/2018 1024 Last data filed at 10/16/2018 0745 Gross per 24 hour  Intake 1676.88 ml  Output 1600 ml  Net 76.88 ml   LBM: Last BM Date: (P) (Flexiseal) Baseline Weight: Weight: 77.1 kg  Most recent weight: Weight: 82.5 kg  Physical exam:  Lethargic, wakes to voice but falls back to sleep. Does not follow commands or show meaningful interaction. Pulmonary: Lungs rhonchi bilateral. No tachypnea, accessory muscle use, or respiratory distress. Cardiovascular: Tachycardia Abdominal: Abdomen soft, non-tender       Palliative Assessment/Data: PPS 20%    Flowsheet Rows     Most Recent Value  Intake Tab  Referral Department  Hospitalist  Unit at Time of Referral  Med/Surg Unit  Palliative Care Primary Diagnosis  Cardiac  Date Notified  10/09/18  Palliative Care Type  New Palliative care  Reason for referral  Clarify Goals of Care  Date of Admission  10/02/18  Date first seen by Palliative Care  10/09/18  # of days  Palliative referral response time  0 Day(s)  # of days IP prior to Palliative referral  7  Clinical Assessment  Psychosocial & Spiritual Assessment  Palliative Care Outcomes      Patient Active Problem List   Diagnosis Date Noted  . Dehydration   . Palliative care by specialist   . Atrial fibrillation with RVR ([place])   . Goals of care, counseling/discussion   . Early onset Alzheimer's dementia without behavioral disturbance ([place])   . Palliative care encounter   . Pressure injury of skin 10/04/2018  . AKI (acute kidney injury) ([place])   . Acute encephalopathy   . Acute respiratory failure ([place])   . Altered mental status   . Rhabdomyolysis 10/02/2018    Palliative Care Assessment & Plan   HPI: 68 y.o. female  with past medical history of dementia/Alzheimer's, diabetes, HTN, hyperthyroid, anemia, former long term smoker admitted on 10/02/2018 after found slumped on porch and EMS (significant other is caregiver and had been hospitalized x 4 days at this time reportedly) called she was found to be in SVT and cardioverted. Required intubation (extubated 10/04/18) and with AKI with rhabdomyolysis and severe dehydration. Has had atrial fibrillation with RVR and continues with fever and concern for aspiration. Continues to fail swallow study and high risk aspiration.     Assessment: Acute respiratory failure with hypoxia Aspiration pneumonia AKI PAF Dementia Acute encephalopathy  Recommendations/Plan:  DNR/DNI  Shift to comfort measures only. Discontinued interventions not aimed at comfort including cortrak.  Symptom management  Morphine '1mg'$  IV q3h prn pain/dyspnea/air hunger  Ativan 0.'5mg'$  IV q6h prn anxiety  Robinul 0.'2mg'$  IV q4h prn secretions  Comfort feeds  SW consult for residential hospice placement. Daughter requests [facility]. Stable for transfer today if bed available.   Code Status:  DNR   Prognosis:   <2 weeks with acute respiratory failure 2/2  aspiration pneumonia, ongoing encephalopathy with underlying progressive dementia. Poor functional/cognitive/nutritional status.   Discharge Planning:  To Be Determined   Care plan was discussed with RN, daughters [name] and [name], Dr. Erlinda Hong  Thank you for allowing the Palliative Medicine Team to assist in the care of this patient.   Total Time 45 Prolonged Time Billed  no    The above conversation was completed via telephone due to the visitor restrictions during the COVID-19 pandemic. Thorough chart review and discussion with necessary members of the care team was completed as part of assessment. All issues were discussed and addressed but no physical exam was performed.     Greater than 50%  of this time was spent counseling and coordinating care related to the above assessment and plan.  Ihor Dow, DNP, FNP-C Palliative Medicine Team  Phone: [phone]  Fax: [phone]

## 2018-10-16 NOTE — Progress Notes (Signed)
Call [facility] and game report to [place]. Cindy requested IV and flexiseal be left in place.  Advise pt schedule for pick up by PTar at 17:30.  PTar arrived to pick up. Pt VSS at discharge. Pt left with IV, foley and flexiseal in place. Flexiseal bag changed and foley emptied before leaving. Pt belongings sent with patient in patient belonging bag which included pt dentures in pink denture cup. Copy of AVS and patient chart sent in white  discharge envelope to the facility with PTar.

## 2018-10-16 NOTE — Progress Notes (Signed)
  Speech Language Pathology Treatment: Dysphagia  Patient Details Name: Jordan Reed MRN: [id number] DOB: [birth year] Today's Date: 10/16/2018 Time: 5809-9833 SLP Time Calculation (min) (ACUTE ONLY): 10 min  Assessment / Plan / Recommendation Clinical Impression  Pt seen in conjunction with Palliative Care NP at bedside with daughter on video chat. Pt presentation similar to prior sessions; with repositioning, oral care and tactile cues pt was responsive to PO trials for a minute or two, opening mouth for bites and swallowing puree. Intermittent coughing observed. Still not adequate arousal for consistent intake. Since that session SLP orders have been discontinued, likely due to plan of care. Will sign off at this time.   HPI HPI: Patient with unknown past medical health. Found on porch slumped over. In ED she was intubated for airway protection, dehydration and rhabdomyolysis. She was extubated after 3 days on 10/04/18. She has no known history of swallowing difficulties.       SLP Plan  Discharge SLP treatment due to (comment)       Recommendations                   Plan: Discharge SLP treatment due to (comment)       GO               Jordan Ditty, MA CCC-SLP  Acute Rehabilitation Services Pager [phone] Office [phone]  Jordan Reed 10/16/2018, 11:44 AM

## 2018-10-16 NOTE — TOC Progression Note (Addendum)
Transition of Care [facility]) - Progression Note    Patient Details  Name: Jordan Reed MRN: [id number] Date of Birth: 1951/04/17  Transition of Care [facility]) CM/SW Contact  Margarito Liner, LCSW Phone Number: 10/16/2018, 1:01 PM  Clinical Narrative: Palliative NP discussed choice with daughters. Made referral to [name] with [facility].    3:06 pm: [facility] has accepted patient and can take her today. [name] has emailed daughter paperwork to complete. MD and RN are aware.  Expected Discharge Plan: Skilled Nursing Facility Barriers to Discharge: No Barriers Identified  Expected Discharge Plan and Services Expected Discharge Plan: Skilled Nursing Facility                                               Social Determinants of Health (SDOH) Interventions    Readmission Risk Interventions No flowsheet data found.

## 2018-11-18 DEATH — deceased

## 2021-04-10 IMAGING — DX PORTABLE CHEST - 1 VIEW
1 series · 1 of 1 positions shown · non-contrast
Comparison: Prior chest x-ray 10/03/2018

CLINICAL DATA: 67-year-old female with fever

EXAM:
PORTABLE CHEST 1 VIEW

[chest ap]
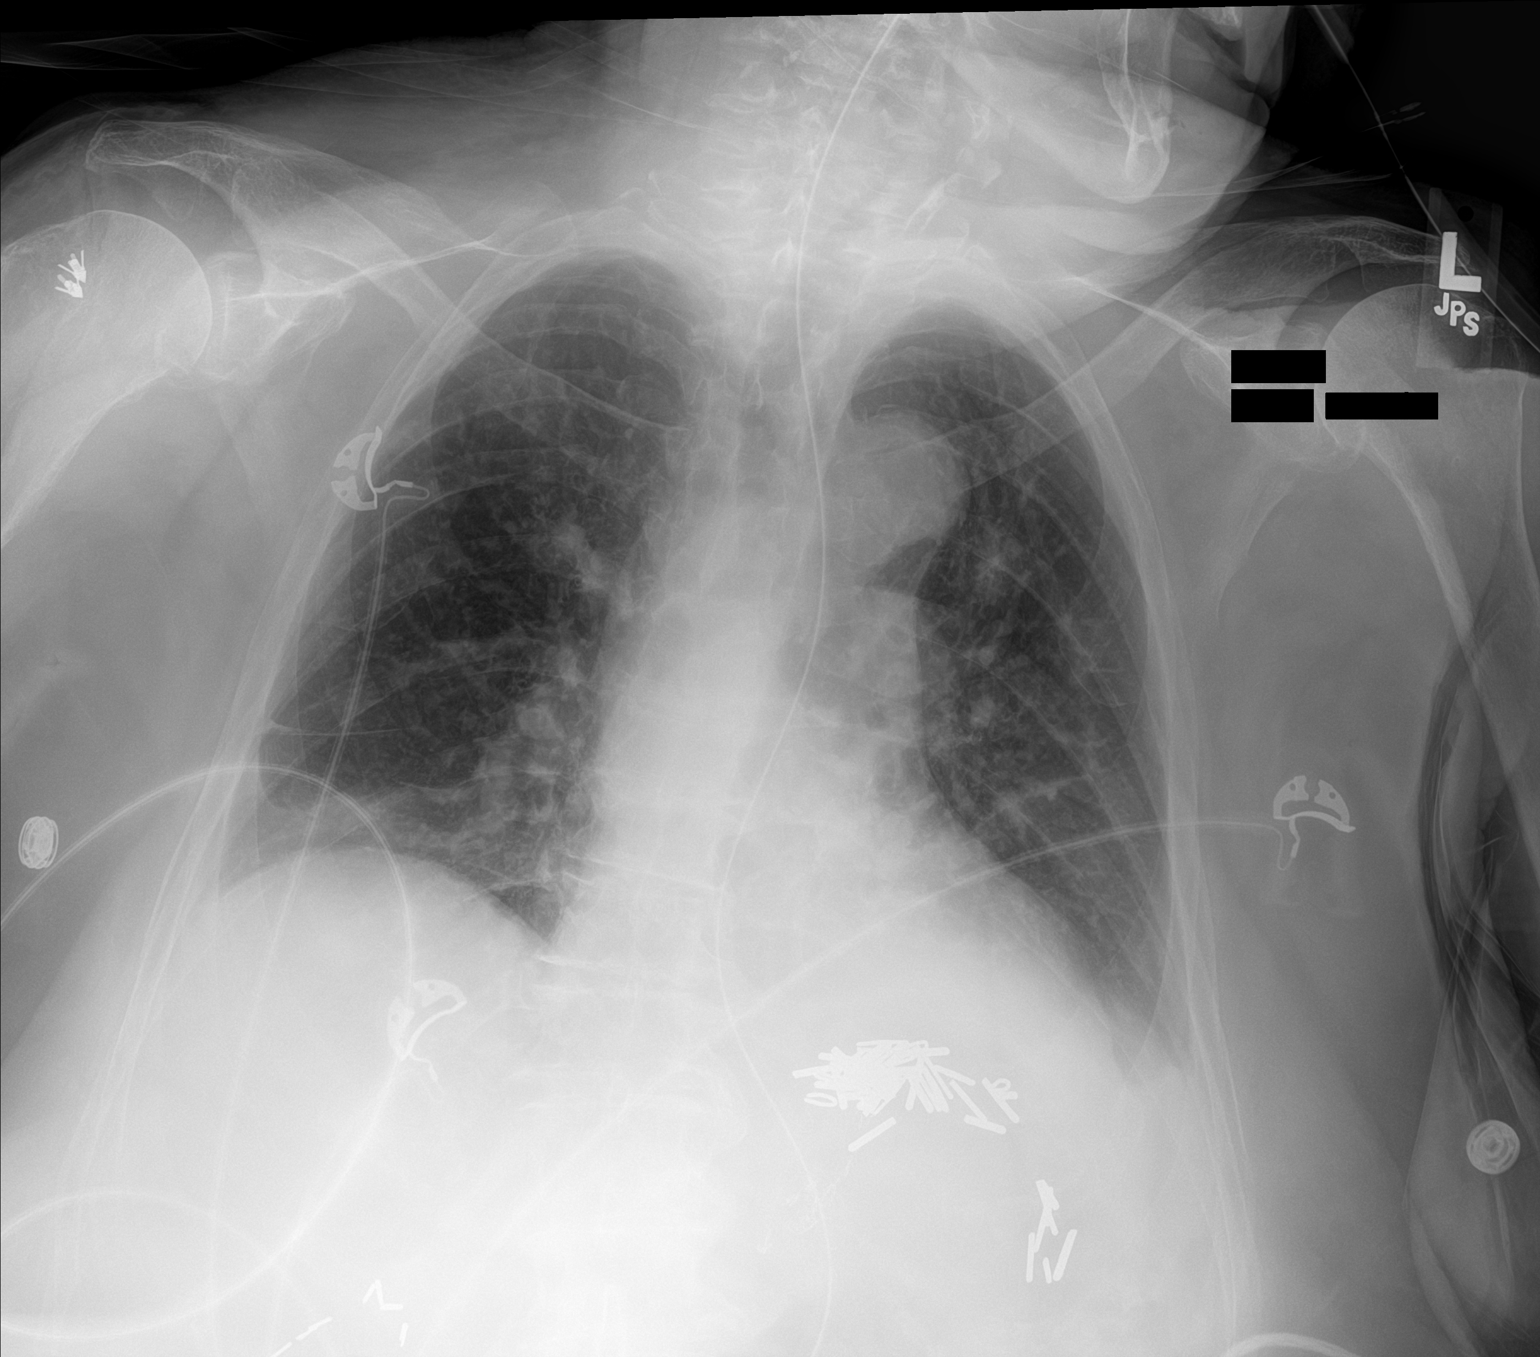

[1 of 1 positions shown; findings below may reference images not displayed]

FINDINGS: The patient has been extubated. A gastric tube remains in place. The
tip of the tube lies off the field of view, presumably within the
stomach. New bibasilar airspace opacities which are veil like on the
right, and more consolidated on the left with obscuration of the
hemidiaphragm. Overall, the inspiratory volumes are lower resulting
in crowding of the pulmonary vasculature. However, there is no overt
edema. No pneumothorax. No acute osseous abnormality. Surgical
changes in the left upper quadrant, right upper quadrant and right
shoulder.
IMPRESSION: 1. Lower inspiratory volumes following extubation.
2. New bibasilar airspace opacities favored to reflect a combination
of small layering pleural effusions and atelectasis. In the
appropriate clinical setting, pneumonia or aspiration would be
difficult to exclude radiographically.

## 2021-04-18 IMAGING — DX PORTABLE CHEST - 1 VIEW
1 series · 1 of 1 positions shown · non-contrast
Comparison: 10/07/2018

CLINICAL DATA: Shortness of breath and weakness. Evaluate for
pneumonia.

EXAM:
PORTABLE CHEST 1 VIEW

[chest]
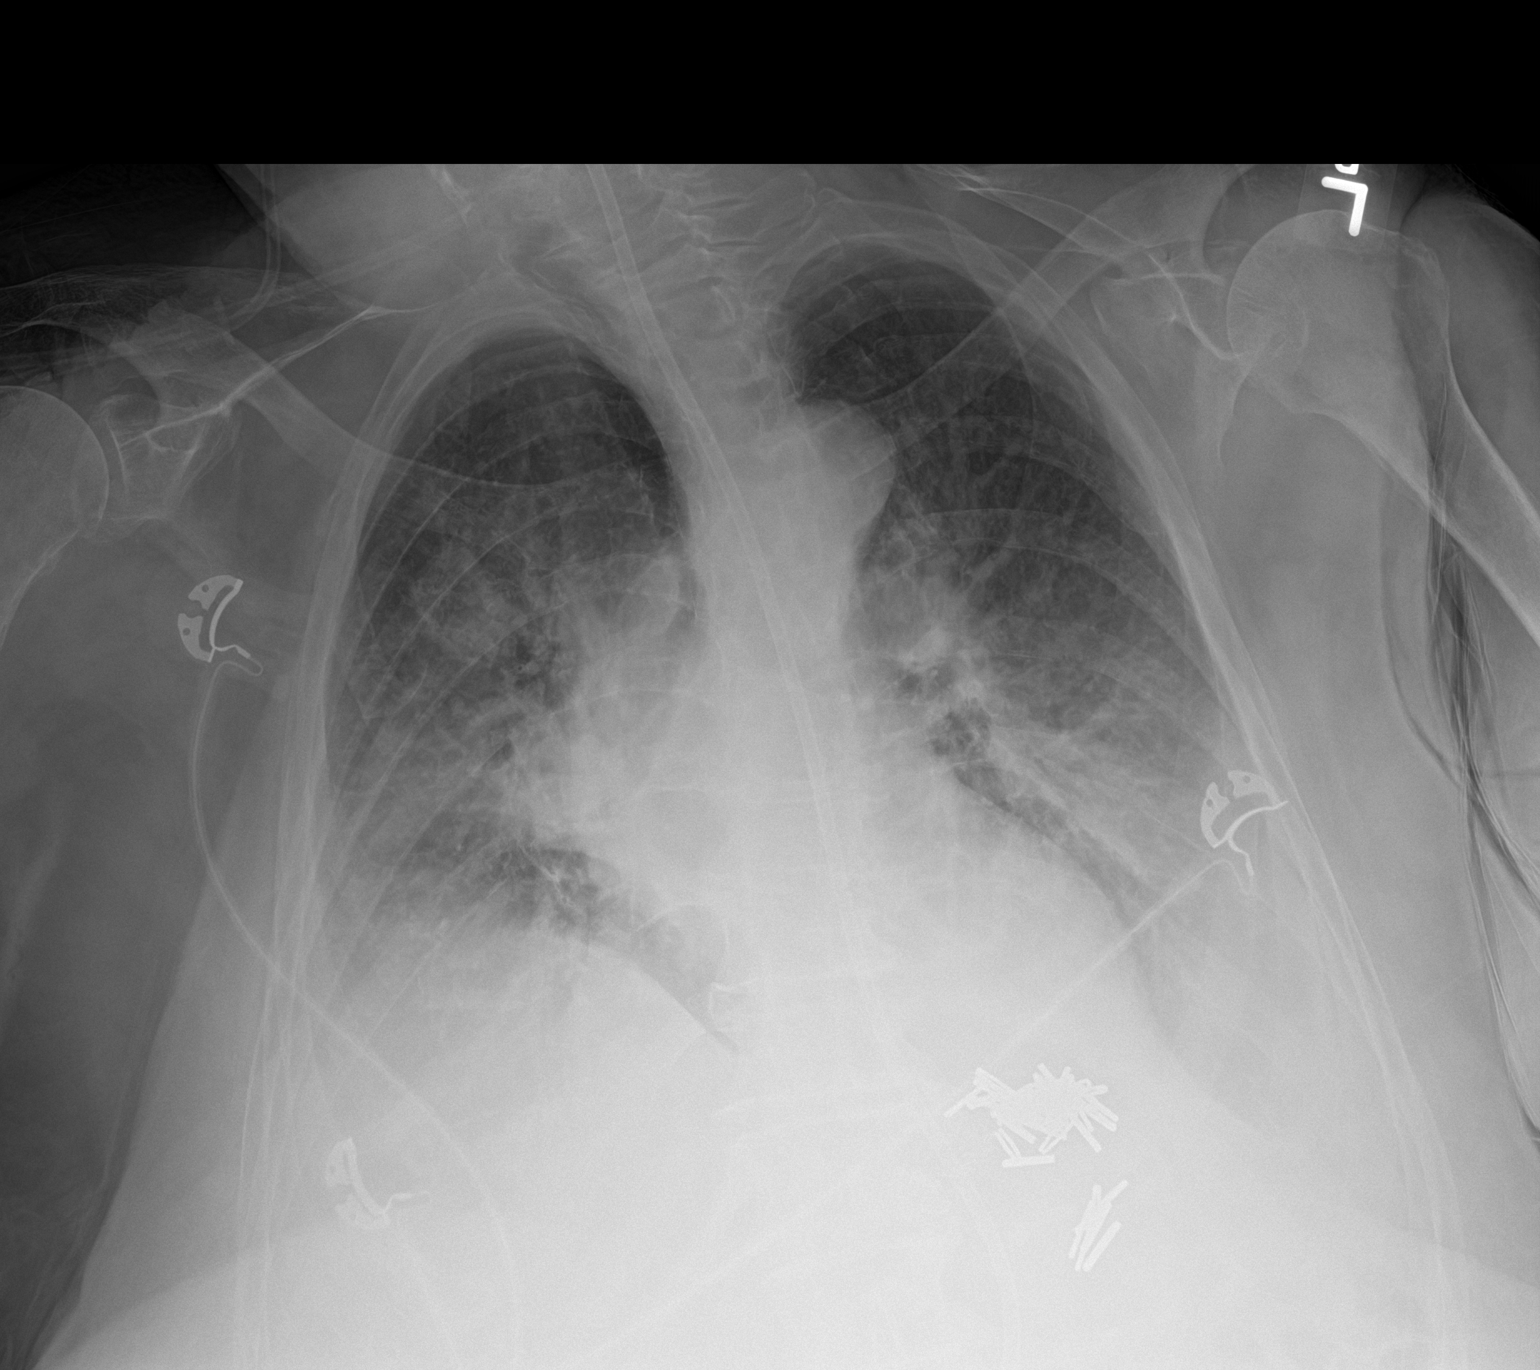

[1 of 1 positions shown; findings below may reference images not displayed]

FINDINGS: Enteric tube courses into the region of the stomach and off the film
as tip is not visualized. Patient is rotated to the right. Lungs are
adequately inflated demonstrate interval development of hazy
airspace opacification over the perihilar regions bilaterally
suggesting interstitial edema and less likely infection. Increased
density to the right hilar region likely due in part to differences
rotation. Mild hazy bibasilar density likely small layering
effusions/atelectasis. Cardiomediastinal silhouette and remainder of
the exam is unchanged.
IMPRESSION: Development of moderate hazy airspace attenuation the perihilar
regions bilaterally likely interstitial edema, although infection is
possible. Suggestion of small layering bilateral pleural
effusions/atelectasis.

Enteric tube courses into the region of the stomach as tip is not
visualized.
